# Patient Record
Sex: Male | Born: 1935 | Race: Black or African American | Hispanic: No | Marital: Married | Smoking: Never smoker
Health system: Southern US, Community
[De-identification: ages and names within clinical notes are randomized; demographics above are authoritative.]

## PROBLEM LIST (undated history)

## (undated) DIAGNOSIS — J449 Chronic obstructive pulmonary disease, unspecified: Secondary | ICD-10-CM

## (undated) DIAGNOSIS — E119 Type 2 diabetes mellitus without complications: Secondary | ICD-10-CM

## (undated) DIAGNOSIS — A419 Sepsis, unspecified organism: Secondary | ICD-10-CM

## (undated) DIAGNOSIS — I10 Essential (primary) hypertension: Secondary | ICD-10-CM

## (undated) DIAGNOSIS — J9621 Acute and chronic respiratory failure with hypoxia: Secondary | ICD-10-CM

## (undated) DIAGNOSIS — U071 COVID-19: Secondary | ICD-10-CM

---

## 2004-12-27 ENCOUNTER — Emergency Department: Payer: Self-pay | Admitting: Internal Medicine

## 2009-12-11 ENCOUNTER — Ambulatory Visit: Payer: Self-pay | Admitting: Internal Medicine

## 2009-12-25 ENCOUNTER — Ambulatory Visit: Payer: Self-pay | Admitting: Internal Medicine

## 2012-10-18 ENCOUNTER — Ambulatory Visit: Payer: Self-pay | Admitting: Physician Assistant

## 2015-02-07 ENCOUNTER — Other Ambulatory Visit: Payer: Self-pay | Admitting: Internal Medicine

## 2015-02-07 DIAGNOSIS — M5010 Cervical disc disorder with radiculopathy, unspecified cervical region: Secondary | ICD-10-CM

## 2015-02-13 ENCOUNTER — Ambulatory Visit
Admission: RE | Admit: 2015-02-13 | Discharge: 2015-02-13 | Disposition: A | Discharge status: Home / Self Care | Payer: Medicare Other | Source: Ambulatory Visit | Attending: Internal Medicine | Admitting: Internal Medicine

## 2015-02-13 DIAGNOSIS — M47892 Other spondylosis, cervical region: Secondary | ICD-10-CM | POA: Insufficient documentation

## 2015-02-13 DIAGNOSIS — M5031 Other cervical disc degeneration,  high cervical region: Secondary | ICD-10-CM | POA: Insufficient documentation

## 2015-02-13 DIAGNOSIS — M5412 Radiculopathy, cervical region: Secondary | ICD-10-CM | POA: Diagnosis present

## 2015-02-13 DIAGNOSIS — M4802 Spinal stenosis, cervical region: Secondary | ICD-10-CM | POA: Insufficient documentation

## 2015-02-13 DIAGNOSIS — M5022 Other cervical disc displacement, mid-cervical region: Secondary | ICD-10-CM | POA: Diagnosis not present

## 2015-02-13 DIAGNOSIS — M542 Cervicalgia: Secondary | ICD-10-CM | POA: Diagnosis present

## 2015-02-13 DIAGNOSIS — M5010 Cervical disc disorder with radiculopathy, unspecified cervical region: Secondary | ICD-10-CM

## 2015-02-13 MED ORDER — GADOBENATE DIMEGLUMINE 529 MG/ML IV SOLN
20.0000 mL | Freq: Once | INTRAVENOUS | Status: AC | PRN
  Administered 2015-02-13: 17 mL via INTRAVENOUS

## 2015-10-04 ENCOUNTER — Other Ambulatory Visit: Payer: Self-pay | Admitting: Internal Medicine

## 2015-10-04 ENCOUNTER — Ambulatory Visit
Admission: RE | Admit: 2015-10-04 | Discharge: 2015-10-04 | Disposition: A | Discharge status: Home / Self Care | Payer: Medicare Other | Source: Ambulatory Visit | Attending: Internal Medicine | Admitting: Internal Medicine

## 2015-10-04 DIAGNOSIS — I824Z2 Acute embolism and thrombosis of unspecified deep veins of left distal lower extremity: Secondary | ICD-10-CM | POA: Insufficient documentation

## 2015-10-04 DIAGNOSIS — I8002 Phlebitis and thrombophlebitis of superficial vessels of left lower extremity: Secondary | ICD-10-CM | POA: Insufficient documentation

## 2019-09-18 ENCOUNTER — Ambulatory Visit
Admission: RE | Admit: 2019-09-18 | Discharge: 2019-09-18 | Disposition: A | Discharge status: Home / Self Care | Payer: Medicare Other | Source: Ambulatory Visit | Attending: Internal Medicine | Admitting: Internal Medicine

## 2019-09-18 ENCOUNTER — Other Ambulatory Visit: Payer: Self-pay

## 2019-09-18 ENCOUNTER — Other Ambulatory Visit: Payer: Self-pay | Admitting: Internal Medicine

## 2019-09-18 DIAGNOSIS — L03116 Cellulitis of left lower limb: Secondary | ICD-10-CM | POA: Insufficient documentation

## 2020-04-03 ENCOUNTER — Emergency Department: Payer: Medicare Other

## 2020-04-03 ENCOUNTER — Other Ambulatory Visit: Payer: Self-pay

## 2020-04-03 ENCOUNTER — Inpatient Hospital Stay
Admission: EM | Admit: 2020-04-03 | Discharge: 2020-04-24 | DRG: 871 | Disposition: A | Discharge status: Other Acute Inpatient Hospital | Payer: Medicare Other | Source: Ambulatory Visit | Attending: Family Medicine | Admitting: Family Medicine

## 2020-04-03 DIAGNOSIS — U071 COVID-19: Secondary | ICD-10-CM | POA: Diagnosis present

## 2020-04-03 DIAGNOSIS — Z7984 Long term (current) use of oral hypoglycemic drugs: Secondary | ICD-10-CM

## 2020-04-03 DIAGNOSIS — D509 Iron deficiency anemia, unspecified: Secondary | ICD-10-CM | POA: Diagnosis present

## 2020-04-03 DIAGNOSIS — R197 Diarrhea, unspecified: Secondary | ICD-10-CM | POA: Diagnosis present

## 2020-04-03 DIAGNOSIS — E872 Acidosis, unspecified: Secondary | ICD-10-CM | POA: Diagnosis present

## 2020-04-03 DIAGNOSIS — E119 Type 2 diabetes mellitus without complications: Secondary | ICD-10-CM

## 2020-04-03 DIAGNOSIS — R778 Other specified abnormalities of plasma proteins: Secondary | ICD-10-CM | POA: Diagnosis present

## 2020-04-03 DIAGNOSIS — A419 Sepsis, unspecified organism: Secondary | ICD-10-CM | POA: Diagnosis not present

## 2020-04-03 DIAGNOSIS — J439 Emphysema, unspecified: Secondary | ICD-10-CM | POA: Diagnosis present

## 2020-04-03 DIAGNOSIS — Z86718 Personal history of other venous thrombosis and embolism: Secondary | ICD-10-CM | POA: Diagnosis not present

## 2020-04-03 DIAGNOSIS — Z66 Do not resuscitate: Secondary | ICD-10-CM | POA: Diagnosis not present

## 2020-04-03 DIAGNOSIS — R0902 Hypoxemia: Secondary | ICD-10-CM

## 2020-04-03 DIAGNOSIS — Z7901 Long term (current) use of anticoagulants: Secondary | ICD-10-CM

## 2020-04-03 DIAGNOSIS — E0800 Diabetes mellitus due to underlying condition with hyperosmolarity without nonketotic hyperglycemic-hyperosmolar coma (NKHHC): Secondary | ICD-10-CM | POA: Diagnosis not present

## 2020-04-03 DIAGNOSIS — R6520 Severe sepsis without septic shock: Secondary | ICD-10-CM | POA: Diagnosis present

## 2020-04-03 DIAGNOSIS — Z7189 Other specified counseling: Secondary | ICD-10-CM | POA: Diagnosis not present

## 2020-04-03 DIAGNOSIS — I10 Essential (primary) hypertension: Secondary | ICD-10-CM | POA: Diagnosis present

## 2020-04-03 DIAGNOSIS — J80 Acute respiratory distress syndrome: Secondary | ICD-10-CM | POA: Diagnosis present

## 2020-04-03 DIAGNOSIS — Z79899 Other long term (current) drug therapy: Secondary | ICD-10-CM | POA: Diagnosis not present

## 2020-04-03 DIAGNOSIS — J1282 Pneumonia due to coronavirus disease 2019: Secondary | ICD-10-CM | POA: Diagnosis present

## 2020-04-03 DIAGNOSIS — E11649 Type 2 diabetes mellitus with hypoglycemia without coma: Secondary | ICD-10-CM | POA: Diagnosis not present

## 2020-04-03 DIAGNOSIS — E860 Dehydration: Secondary | ICD-10-CM | POA: Diagnosis present

## 2020-04-03 DIAGNOSIS — N179 Acute kidney failure, unspecified: Secondary | ICD-10-CM | POA: Diagnosis present

## 2020-04-03 DIAGNOSIS — D638 Anemia in other chronic diseases classified elsewhere: Secondary | ICD-10-CM | POA: Diagnosis present

## 2020-04-03 DIAGNOSIS — H9190 Unspecified hearing loss, unspecified ear: Secondary | ICD-10-CM | POA: Diagnosis present

## 2020-04-03 DIAGNOSIS — A4189 Other specified sepsis: Principal | ICD-10-CM | POA: Diagnosis present

## 2020-04-03 DIAGNOSIS — J9621 Acute and chronic respiratory failure with hypoxia: Secondary | ICD-10-CM | POA: Diagnosis not present

## 2020-04-03 DIAGNOSIS — E875 Hyperkalemia: Secondary | ICD-10-CM | POA: Diagnosis present

## 2020-04-03 DIAGNOSIS — R110 Nausea: Secondary | ICD-10-CM | POA: Diagnosis present

## 2020-04-03 DIAGNOSIS — J449 Chronic obstructive pulmonary disease, unspecified: Secondary | ICD-10-CM | POA: Diagnosis not present

## 2020-04-03 DIAGNOSIS — Z283 Underimmunization status: Secondary | ICD-10-CM

## 2020-04-03 DIAGNOSIS — Z515 Encounter for palliative care: Secondary | ICD-10-CM | POA: Diagnosis not present

## 2020-04-03 DIAGNOSIS — J9600 Acute respiratory failure, unspecified whether with hypoxia or hypercapnia: Secondary | ICD-10-CM | POA: Diagnosis not present

## 2020-04-03 DIAGNOSIS — Z888 Allergy status to other drugs, medicaments and biological substances status: Secondary | ICD-10-CM

## 2020-04-03 DIAGNOSIS — J989 Respiratory disorder, unspecified: Secondary | ICD-10-CM | POA: Diagnosis present

## 2020-04-03 DIAGNOSIS — R0602 Shortness of breath: Secondary | ICD-10-CM

## 2020-04-03 DIAGNOSIS — R1084 Generalized abdominal pain: Secondary | ICD-10-CM

## 2020-04-03 HISTORY — DX: Essential (primary) hypertension: I10

## 2020-04-03 HISTORY — DX: Type 2 diabetes mellitus without complications: E11.9

## 2020-04-03 LAB — CBC WITH DIFFERENTIAL/PLATELET
Abs Immature Granulocytes: 0.03 10*3/uL (ref 0.00–0.07)
Basophils Absolute: 0 10*3/uL (ref 0.0–0.1)
Basophils Relative: 0 %
Eosinophils Absolute: 0 10*3/uL (ref 0.0–0.5)
Eosinophils Relative: 0 %
HCT: 38.6 % — ABNORMAL LOW (ref 39.0–52.0)
Hemoglobin: 12.7 g/dL — ABNORMAL LOW (ref 13.0–17.0)
Immature Granulocytes: 1 %
Lymphocytes Relative: 11 %
Lymphs Abs: 0.6 10*3/uL — ABNORMAL LOW (ref 0.7–4.0)
MCH: 21.2 pg — ABNORMAL LOW (ref 26.0–34.0)
MCHC: 32.9 g/dL (ref 30.0–36.0)
MCV: 64.5 fL — ABNORMAL LOW (ref 80.0–100.0)
Monocytes Absolute: 0.6 10*3/uL (ref 0.1–1.0)
Monocytes Relative: 11 %
Neutro Abs: 3.9 10*3/uL (ref 1.7–7.7)
Neutrophils Relative %: 77 %
Platelets: 305 10*3/uL (ref 150–400)
RBC: 5.98 MIL/uL — ABNORMAL HIGH (ref 4.22–5.81)
RDW: 22.6 % — ABNORMAL HIGH (ref 11.5–15.5)
Smear Review: NORMAL
WBC: 5.1 10*3/uL (ref 4.0–10.5)
nRBC: 0 % (ref 0.0–0.2)

## 2020-04-03 LAB — LACTIC ACID, PLASMA
Lactic Acid, Venous: 4.4 mmol/L (ref 0.5–1.9)
Lactic Acid, Venous: 3.3 mmol/L (ref 0.5–1.9)

## 2020-04-03 LAB — COMPREHENSIVE METABOLIC PANEL
ALT: 84 U/L — ABNORMAL HIGH (ref 0–44)
AST: 102 U/L — ABNORMAL HIGH (ref 15–41)
Albumin: 2.8 g/dL — ABNORMAL LOW (ref 3.5–5.0)
Alkaline Phosphatase: 67 U/L (ref 38–126)
Anion gap: 15 (ref 5–15)
BUN: 15 mg/dL (ref 8–23)
CO2: 15 mmol/L — ABNORMAL LOW (ref 22–32)
Calcium: 8.2 mg/dL — ABNORMAL LOW (ref 8.9–10.3)
Chloride: 109 mmol/L (ref 98–111)
Creatinine, Ser: 1.37 mg/dL — ABNORMAL HIGH (ref 0.61–1.24)
GFR calc Af Amer: 54 mL/min — ABNORMAL LOW (ref >60)
GFR calc non Af Amer: 46 mL/min — ABNORMAL LOW (ref >60)
Glucose, Bld: 187 mg/dL — ABNORMAL HIGH (ref 70–99)
Potassium: 4.4 mmol/L (ref 3.5–5.1)
Sodium: 139 mmol/L (ref 135–145)
Total Bilirubin: 1 mg/dL (ref 0.3–1.2)
Total Protein: 7.8 g/dL (ref 6.5–8.1)

## 2020-04-03 LAB — BLOOD GAS, VENOUS
Acid-base deficit: 9.7 mmol/L — ABNORMAL HIGH (ref 0.0–2.0)
Bicarbonate: 14.8 mmol/L — ABNORMAL LOW (ref 20.0–28.0)
FIO2: 0.5
O2 Saturation: 44.7 %
Patient temperature: 37
pCO2, Ven: 28 mmHg — ABNORMAL LOW (ref 44.0–60.0)
pH, Ven: 7.33 (ref 7.250–7.430)
pO2, Ven: <31 mmHg — CL (ref 32.0–45.0)

## 2020-04-03 LAB — TROPONIN I (HIGH SENSITIVITY)
Troponin I (High Sensitivity): 19 ng/L — ABNORMAL HIGH (ref <18)
Troponin I (High Sensitivity): 19 ng/L — ABNORMAL HIGH (ref <18)
Troponin I (High Sensitivity): 19 ng/L — ABNORMAL HIGH (ref <18)

## 2020-04-03 LAB — FIBRINOGEN: Fibrinogen: 599 mg/dL — ABNORMAL HIGH (ref 210–475)

## 2020-04-03 LAB — GLUCOSE, CAPILLARY: Glucose-Capillary: 196 mg/dL — ABNORMAL HIGH (ref 70–99)

## 2020-04-03 LAB — BRAIN NATRIURETIC PEPTIDE: B Natriuretic Peptide: 193.5 pg/mL — ABNORMAL HIGH (ref 0.0–100.0)

## 2020-04-03 LAB — FERRITIN: Ferritin: 224 ng/mL (ref 24–336)

## 2020-04-03 LAB — SARS CORONAVIRUS 2 BY RT PCR (HOSPITAL ORDER, PERFORMED IN ~~LOC~~ HOSPITAL LAB): SARS Coronavirus 2: POSITIVE — AB

## 2020-04-03 LAB — FIBRIN DERIVATIVES D-DIMER (ARMC ONLY): Fibrin derivatives D-dimer (ARMC): 2643.96 ng/mL (FEU) — ABNORMAL HIGH (ref 0.00–499.00)

## 2020-04-03 LAB — LACTATE DEHYDROGENASE: LDH: 402 U/L — ABNORMAL HIGH (ref 98–192)

## 2020-04-03 MED ORDER — SODIUM CHLORIDE 0.9 % IV SOLN
100.0000 mg | Freq: Every day | INTRAVENOUS | Status: AC
  Administered 2020-04-04 – 2020-04-07 (×4): 100 mg via INTRAVENOUS
  Filled 2020-04-03: qty 100
  Filled 2020-04-03: qty 20
  Filled 2020-04-03 (×2): qty 100

## 2020-04-03 MED ORDER — IPRATROPIUM-ALBUTEROL 20-100 MCG/ACT IN AERS
1.0000 | INHALATION_SPRAY | Freq: Four times a day (QID) | RESPIRATORY_TRACT | Status: DC
  Administered 2020-04-04 – 2020-04-14 (×39): 1 via RESPIRATORY_TRACT
  Filled 2020-04-03 (×3): qty 4

## 2020-04-03 MED ORDER — SODIUM CHLORIDE 0.9 % IV SOLN
500.0000 mg | INTRAVENOUS | Status: DC
  Administered 2020-04-03 – 2020-04-06 (×4): 500 mg via INTRAVENOUS
  Filled 2020-04-03 (×5): qty 500

## 2020-04-03 MED ORDER — HYDROCOD POLST-CPM POLST ER 10-8 MG/5ML PO SUER
5.0000 mL | Freq: Two times a day (BID) | ORAL | Status: DC | PRN
  Administered 2020-04-10 – 2020-04-17 (×3): 5 mL via ORAL
  Filled 2020-04-03 (×3): qty 5

## 2020-04-03 MED ORDER — ONDANSETRON HCL 4 MG/2ML IJ SOLN: 4.0000 mg | Freq: Four times a day (QID) | INTRAMUSCULAR | Status: DC | PRN

## 2020-04-03 MED ORDER — INSULIN ASPART 100 UNIT/ML ~~LOC~~ SOLN
0.0000 [IU] | Freq: Every day | SUBCUTANEOUS | Status: DC
  Administered 2020-04-04: 2 [IU] via SUBCUTANEOUS
  Administered 2020-04-10: 4 [IU] via SUBCUTANEOUS
  Administered 2020-04-11: 3 [IU] via SUBCUTANEOUS
  Administered 2020-04-13: 4 [IU] via SUBCUTANEOUS
  Administered 2020-04-14: 2 [IU] via SUBCUTANEOUS
  Filled 2020-04-03 (×6): qty 1

## 2020-04-03 MED ORDER — INSULIN ASPART 100 UNIT/ML ~~LOC~~ SOLN
0.0000 [IU] | Freq: Three times a day (TID) | SUBCUTANEOUS | Status: DC
  Administered 2020-04-04 (×2): 3 [IU] via SUBCUTANEOUS
  Filled 2020-04-03 (×2): qty 1

## 2020-04-03 MED ORDER — ADULT MULTIVITAMIN W/MINERALS CH
1.0000 | ORAL_TABLET | Freq: Every day | ORAL | Status: DC
  Administered 2020-04-03 – 2020-04-24 (×21): 1 via ORAL
  Filled 2020-04-03 (×22): qty 1

## 2020-04-03 MED ORDER — IOHEXOL 350 MG/ML SOLN
75.0000 mL | Freq: Once | INTRAVENOUS | Status: AC | PRN
  Administered 2020-04-03: 75 mL via INTRAVENOUS

## 2020-04-03 MED ORDER — ACETAMINOPHEN 325 MG PO TABS
650.0000 mg | ORAL_TABLET | Freq: Four times a day (QID) | ORAL | Status: DC | PRN
  Administered 2020-04-09 – 2020-04-14 (×2): 650 mg via ORAL
  Filled 2020-04-03 (×3): qty 2

## 2020-04-03 MED ORDER — THIAMINE HCL 100 MG PO TABS
100.0000 mg | ORAL_TABLET | Freq: Every day | ORAL | Status: DC
  Administered 2020-04-03 – 2020-04-24 (×21): 100 mg via ORAL
  Filled 2020-04-03 (×23): qty 1

## 2020-04-03 MED ORDER — GUAIFENESIN-DM 100-10 MG/5ML PO SYRP
10.0000 mL | ORAL_SOLUTION | ORAL | Status: DC | PRN
  Filled 2020-04-03 (×2): qty 10

## 2020-04-03 MED ORDER — ENOXAPARIN SODIUM 40 MG/0.4ML ~~LOC~~ SOLN
40.0000 mg | SUBCUTANEOUS | Status: DC
  Administered 2020-04-03: 40 mg via SUBCUTANEOUS

## 2020-04-03 MED ORDER — SODIUM CHLORIDE 0.9 % IV SOLN
200.0000 mg | Freq: Once | INTRAVENOUS | Status: AC
  Administered 2020-04-03: 200 mg via INTRAVENOUS
  Filled 2020-04-03: qty 200

## 2020-04-03 MED ORDER — FOLIC ACID 1 MG PO TABS
1.0000 mg | ORAL_TABLET | Freq: Every day | ORAL | Status: DC
  Administered 2020-04-03 – 2020-04-07 (×5): 1 mg via ORAL
  Filled 2020-04-03 (×5): qty 1

## 2020-04-03 MED ORDER — DEXAMETHASONE SODIUM PHOSPHATE 10 MG/ML IJ SOLN
6.0000 mg | INTRAMUSCULAR | Status: DC
  Administered 2020-04-03: 6 mg via INTRAVENOUS
  Filled 2020-04-03: qty 1

## 2020-04-03 MED ORDER — SODIUM CHLORIDE 0.9 % IV SOLN
100.0000 mg | Freq: Every day | INTRAVENOUS | Status: DC
  Filled 2020-04-03: qty 20

## 2020-04-03 MED ORDER — ONDANSETRON HCL 4 MG PO TABS: 4.0000 mg | ORAL_TABLET | Freq: Four times a day (QID) | ORAL | Status: DC | PRN

## 2020-04-03 MED ORDER — BARICITINIB 2 MG PO TABS
2.0000 mg | ORAL_TABLET | Freq: Every day | ORAL | Status: DC
  Administered 2020-04-03 – 2020-04-06 (×3): 2 mg via ORAL
  Filled 2020-04-03 (×6): qty 1

## 2020-04-03 NOTE — ED Notes (Signed)
Pt going to CT

## 2020-04-03 NOTE — ED Provider Notes (Signed)
[facility] Emergency Department Provider Note   ____________________________________________   First MD Initiated Contact with Patient 04/03/20 1528     (approximate)  I have reviewed the triage vital signs and the nursing notes.   HISTORY  Chief Complaint Respiratory Distress    HPI ELSTON ALDAPE is a 86 y.o. male who has been short of breath for several days has been getting worse.  He also has nausea diarrhea.  He is having some belly pain now.  He thinks may be it is from coughing.  He is had a fever.  He is coughing up some phlegm but cannot tell me what color it is.  O2 sats were in the 80s on room air 90 to 100%         Past Medical History:  Diagnosis Date  . Diabetes mellitus without complication (HCC)   . Hypertension     There are no problems to display for this patient.   History reviewed. No pertinent surgical history.  Prior to Admission medications   Not on File    Allergies Patient has no allergy information on record.  No family history on file.  Social History Social History   Tobacco Use  . Smoking status: Never Smoker  . Smokeless tobacco: Never Used  Substance Use Topics  . Alcohol use: Never  . Drug use: Never    Review of Systems  Constitutional: fever/chills Eyes: No visual changes. ENT: No sore throat. Cardiovascular: Denies chest pain. Respiratory:  shortness of breath. Gastrointestinal:  abdominal pain.   nausea, no vomiting.  diarrhea.  No constipation. Genitourinary: Negative for dysuria. Musculoskeletal: Negative for back pain. Skin: Negative for rash. Neurological: Negative for headaches, focal weakness   ____________________________________________   PHYSICAL EXAM:  VITAL SIGNS: ED Triage Vitals  Enc Vitals Group     BP --      Pulse --      Resp --      Temp --      Temp src --      SpO2 04/03/20 1525 92 %     Weight 04/03/20 1526 150 lb (68 kg)     Height 04/03/20 1526  5\' 6"  (1.676 m)     Head Circumference --      Peak Flow --      Pain Score 04/03/20 1526 0     Pain Loc --      Pain Edu? --      Excl. in GC? --     Constitutional: Alert and oriented. Well appearing and in no acute distress. Eyes: Conjunctivae are normal. PER Head: Atraumatic. Nose: No congestion/rhinnorhea. Mouth/Throat: Mucous membranes are moist.  Oropharynx non-erythematous. Neck: No stridor.   Cardiovascular: Rapid rate, regular rhythm. Grossly normal heart sounds.  Good peripheral circulation. Respiratory: Increased respiratory effort.  No retractions. Lungs crackles in bases Gastrointestinal: Soft diffusely tender no distention. No abdominal bruits. N Musculoskeletal: No lower extremity tenderness nor edema.  . Neurologic:  Normal speech and language. No gross focal neurologic deficits are appreciated.  Skin:  Skin is warm, dry and intact. No rash noted. .  ____________________________________________   LABS (all labs ordered are listed, but only abnormal results are displayed)  Labs Reviewed  SARS CORONAVIRUS 2 BY RT PCR (HOSPITAL ORDER, PERFORMED IN [facility]) - Abnormal; Notable for the following components:      Result Value   SARS Coronavirus 2 POSITIVE (*)    All other components within normal  limits  COMPREHENSIVE METABOLIC PANEL - Abnormal; Notable for the following components:   CO2 15 (*)    Glucose, Bld 187 (*)    Creatinine, Ser 1.37 (*)    Calcium 8.2 (*)    Albumin 2.8 (*)    AST 102 (*)    ALT 84 (*)    GFR calc non Af Amer 46 (*)    GFR calc Af Amer 54 (*)    All other components within normal limits  BRAIN NATRIURETIC PEPTIDE - Abnormal; Notable for the following components:   B Natriuretic Peptide 193.5 (*)    All other components within normal limits  LACTIC ACID, PLASMA - Abnormal; Notable for the following components:   Lactic Acid, Venous 4.4 (*)    All other components within normal limits  CBC WITH  DIFFERENTIAL/PLATELET - Abnormal; Notable for the following components:   RBC 5.98 (*)    Hemoglobin 12.7 (*)    HCT 38.6 (*)    MCV 64.5 (*)    MCH 21.2 (*)    RDW 22.6 (*)    Lymphs Abs 0.6 (*)    All other components within normal limits  BLOOD GAS, VENOUS - Abnormal; Notable for the following components:   pCO2, Ven 28 (*)    pO2, Ven <31.0 (*)    Bicarbonate 14.8 (*)    Acid-base deficit 9.7 (*)    All other components within normal limits  FIBRIN DERIVATIVES D-DIMER ([facility] ONLY) - Abnormal; Notable for the following components:   Fibrin derivatives D-dimer [facility]) [phone] (*)    All other components within normal limits  TROPONIN I (HIGH SENSITIVITY) - Abnormal; Notable for the following components:   Troponin I (High Sensitivity) 19 (*)    All other components within normal limits  TROPONIN I (HIGH SENSITIVITY) - Abnormal; Notable for the following components:   Troponin I (High Sensitivity) 19 (*)    All other components within normal limits  LACTIC ACID, PLASMA  URINALYSIS, COMPLETE (UACMP) WITH MICROSCOPIC   ____________________________________________  EKG  EKG read interpreted by me shows sinus tach rate of 103 multiple PVCs slight left axis no acute ST-T wave changes ____________________________________________  RADIOLOGY  ED MD interpretation: CT of the chest is read by radiology as no PE no acute airspace disease but emphysema.  Patient has Covid.  Patient to me appears to have Covid pneumonia.  He does also have emphysema. Official radiology report(s): CT Angio Chest PE W and/or Wo Contrast  Result Date: 04/03/2020 CLINICAL DATA:  COVID-19 positive, shortness of breath, weakness, hypoxia, abdominal pain EXAM: CT ANGIOGRAPHY CHEST CT ABDOMEN AND PELVIS WITH CONTRAST TECHNIQUE: Multidetector CT imaging of the chest was performed using the standard protocol during bolus administration of intravenous contrast. Multiplanar CT image reconstructions and MIPs were  obtained to evaluate the vascular anatomy. Multidetector CT imaging of the abdomen and pelvis was performed using the standard protocol during bolus administration of intravenous contrast. CONTRAST:  70mL OMNIPAQUE IOHEXOL 350 MG/ML SOLN COMPARISON:  04/03/2020 FINDINGS: CTA CHEST FINDINGS Cardiovascular: This is a technically adequate evaluation of the pulmonary vasculature. No filling defects or pulmonary emboli. The heart is unremarkable without pericardial effusion. Normal caliber of the thoracic aorta. Mediastinum/Nodes: Mildly enlarged subcarinal lymph node measures 13 mm in short axis. Trachea, esophagus, and thyroid are unremarkable. Lungs/Pleura: There is severe upper lobe predominant emphysema. No acute airspace disease, effusion, or pneumothorax. Bibasilar areas of scarring are noted, right greater than left. The central airways are patent. Musculoskeletal: No acute  or destructive bony lesions. Reconstructed images demonstrate no additional findings. Review of the MIP images confirms the above findings. CT ABDOMEN and PELVIS FINDINGS Hepatobiliary: 4.2 cm cyst within the caudate lobe of the liver. Otherwise liver is unremarkable. No biliary dilation. The gallbladder is normal. Pancreas: Unremarkable. No pancreatic ductal dilatation or surrounding inflammatory changes. Spleen: Normal in size without focal abnormality. Adrenals/Urinary Tract: Adrenal glands are unremarkable. Kidneys are normal, without renal calculi, focal lesion, or hydronephrosis. Bladder is unremarkable. Stomach/Bowel: No bowel obstruction or ileus. Normal appendix right lower quadrant. There is diffuse diverticulosis of the descending and sigmoid colon without diverticulitis. Vascular/Lymphatic: Aortic atherosclerosis. No enlarged abdominal or pelvic lymph nodes. Reproductive: Prostate is unremarkable. Other: No free fluid or free gas.  No abdominal wall hernia. Musculoskeletal: No acute or destructive bony lesions. Reconstructed images  demonstrate no additional findings. Review of the MIP images confirms the above findings. IMPRESSION: 1. No evidence of pulmonary embolus. 2. Borderline enlarged subcarinal lymph node, likely reactive. 3. Diverticulosis without diverticulitis. 4. Aortic Atherosclerosis (ICD10-I70.0) and Emphysema (ICD10-J43.9). Electronically Signed   By: Sharlet SalinaMichael  Brown M.D.   On: 04/03/2020 19:14   CT ABDOMEN PELVIS W CONTRAST  Result Date: 04/03/2020 CLINICAL DATA:  COVID-19 positive, shortness of breath, weakness, hypoxia, abdominal pain EXAM: CT ANGIOGRAPHY CHEST CT ABDOMEN AND PELVIS WITH CONTRAST TECHNIQUE: Multidetector CT imaging of the chest was performed using the standard protocol during bolus administration of intravenous contrast. Multiplanar CT image reconstructions and MIPs were obtained to evaluate the vascular anatomy. Multidetector CT imaging of the abdomen and pelvis was performed using the standard protocol during bolus administration of intravenous contrast. CONTRAST:  75mL OMNIPAQUE IOHEXOL 350 MG/ML SOLN COMPARISON:  04/03/2020 FINDINGS: CTA CHEST FINDINGS Cardiovascular: This is a technically adequate evaluation of the pulmonary vasculature. No filling defects or pulmonary emboli. The heart is unremarkable without pericardial effusion. Normal caliber of the thoracic aorta. Mediastinum/Nodes: Mildly enlarged subcarinal lymph node measures 13 mm in short axis. Trachea, esophagus, and thyroid are unremarkable. Lungs/Pleura: There is severe upper lobe predominant emphysema. No acute airspace disease, effusion, or pneumothorax. Bibasilar areas of scarring are noted, right greater than left. The central airways are patent. Musculoskeletal: No acute or destructive bony lesions. Reconstructed images demonstrate no additional findings. Review of the MIP images confirms the above findings. CT ABDOMEN and PELVIS FINDINGS Hepatobiliary: 4.2 cm cyst within the caudate lobe of the liver. Otherwise liver is unremarkable.  No biliary dilation. The gallbladder is normal. Pancreas: Unremarkable. No pancreatic ductal dilatation or surrounding inflammatory changes. Spleen: Normal in size without focal abnormality. Adrenals/Urinary Tract: Adrenal glands are unremarkable. Kidneys are normal, without renal calculi, focal lesion, or hydronephrosis. Bladder is unremarkable. Stomach/Bowel: No bowel obstruction or ileus. Normal appendix right lower quadrant. There is diffuse diverticulosis of the descending and sigmoid colon without diverticulitis. Vascular/Lymphatic: Aortic atherosclerosis. No enlarged abdominal or pelvic lymph nodes. Reproductive: Prostate is unremarkable. Other: No free fluid or free gas.  No abdominal wall hernia. Musculoskeletal: No acute or destructive bony lesions. Reconstructed images demonstrate no additional findings. Review of the MIP images confirms the above findings. IMPRESSION: 1. No evidence of pulmonary embolus. 2. Borderline enlarged subcarinal lymph node, likely reactive. 3. Diverticulosis without diverticulitis. 4. Aortic Atherosclerosis (ICD10-I70.0) and Emphysema (ICD10-J43.9). Electronically Signed   By: Sharlet SalinaMichael  Brown M.D.   On: 04/03/2020 19:14   DG Chest Portable 1 View  Result Date: 04/03/2020 CLINICAL DATA:  Respiratory distress, weakness, short of breath EXAM: PORTABLE CHEST 1 VIEW COMPARISON:  None. FINDINGS: Single frontal  view of the chest demonstrates an unremarkable cardiac silhouette. There is diffuse increased interstitial prominence throughout the lungs, without focal consolidation, effusion, or pneumothorax. There are no acute bony abnormalities. IMPRESSION: 1. Diffuse increased interstitial prominence, without focal lung consolidation. Findings are of uncertain acuity, but could reflect interstitial edema, atypical infection, or scarring. Electronically Signed   By: Sharlet Salina M.D.   On: 04/03/2020 15:51     ____________________________________________   PROCEDURES  Procedure(s) performed (including Critical Care):  Procedures   ____________________________________________   INITIAL IMPRESSION / ASSESSMENT AND PLAN / ED COURSE               ____________________________________________   FINAL CLINICAL IMPRESSION(S) / ED DIAGNOSES  Final diagnoses:  COVID-19  Hypoxia  Generalized abdominal pain     ED Discharge Orders    None       Note:  This document was prepared using Dragon voice recognition software and may include unintentional dictation errors.    Arnaldo Natal, MD 04/03/20 [phone]

## 2020-04-03 NOTE — ED Notes (Signed)
RT at bedside placing pt on BIPAP

## 2020-04-03 NOTE — ED Notes (Signed)
Lab called for blood draw at thsit ime

## 2020-04-03 NOTE — ED Triage Notes (Addendum)
Pt arrives via EMS from home PCP with c/o respiratory distress. Pt reports weakness and SOB. Pt went to PCP for evaluation. Pt unable to get out of car and O2 in 70% RA.  EMS placed pt on NRB and [place] at 18L total with O2 at 93%. Pt states some diarrhea for couples of days. Family reports possible COVID.

## 2020-04-03 NOTE — ED Notes (Signed)
Pt placed on NRB at this time. Sats 98%

## 2020-04-03 NOTE — Consult Note (Signed)
Remdesivir - Pharmacy Brief Note   O:  ALT: 84 CXR: Diffuse increased interstitial prominence, without focal lung consolidation. Findings are of uncertain acuity, but could reflect interstitial edema, atypical infection, or scarring. SpO2: 97% on Bi-PAP   A/P:  Remdesivir 200 mg IVPB once followed by 100 mg IVPB daily x 4 days.   Reatha Armour, PharmD Pharmacy Resident  04/03/2020 8:26 PM

## 2020-04-03 NOTE — ED Notes (Signed)
Date and time results received: 04/03/20 1529 (use smartphrase ".now" to insert current time)  Test: lactic Critical Value: 4.4  Name of Provider Notified: Darnelle Catalan MD  Orders Received? Or Actions Taken?: Orders Received - See Orders for details

## 2020-04-03 NOTE — H&P (Signed)
History and Physical   Stephen Siturnest H Stough EXB:[id number] DOB: 01/08/1932 DOA: 04/03/2020  Referring MD/NP/PA: Dr. Darnelle CatalanMalinda  PCP: Barbette ReichmannHande, Vishwanath, MD   Outpatient Specialists: None  Patient coming from: Home  Chief Complaint: Shortness of breath  HPI: Stephen Paul is a 86 y.o. male with medical history significant of diabetes and hypertension who apparently was told about a week ago that he was positive for COVID-19.  Never had any symptoms did not feel any worse until now.  His shortness of breath was progressive and got worse.  On arrival in the ER his oxygen sat was in the 70s.  He is having some cough.  He also reported subjective fever at home.  He is having abdominal pain as well.  Patient was evaluated again and found to be COVID-19 positive in the ER.  Patient also has had some loss of appetite.  Currently on BiPAP in the ER with oxygen sat at 100%.  He is currently being admitted to the hospital with acute respiratory failure secondary to COVID-19 infection.  Appears to have COPD on CT.  ED Course: Temperature 98 blood pressure 173/90 pulse 90 respirate 29 oxygen sat 90% on room air.  White count 5.1 his hemoglobin 12.7 and platelets of 305.  Chemistry showed CO2 15 creatinine 1.37 calcium 8.2.  COVID-19 screen is positive.  Fibrin diabetes 2643.  CT abdomen and pelvis as well as CT angiogram of the chest not significant changes.  Lactic acid is 4.4 troponin of 19.  BNP 193.  Chest x-ray showed bilateral infiltrates.  Patient being admitted to the hospital with acute respiratory failure secondary to COVID-19 infection.  Review of Systems: As per HPI otherwise 10 point review of systems negative.    Past Medical History:  Diagnosis Date  . Diabetes mellitus without complication (HCC)   . Hypertension     History reviewed. No pertinent surgical history.   reports that he has never smoked. He has never used smokeless tobacco. He reports that he does not drink alcohol and does not  use drugs.  Not on File  No family history on file.   Prior to Admission medications   Not on File    Physical Exam: Vitals:   04/03/20 1800 04/03/20 1815 04/03/20 1830 04/03/20 1934  BP: (!) 150/105  136/79 (!) 146/85  Pulse: 87 91 88 90  Resp: 18 (!) 24 (!) 21 (!) 23  Temp:      SpO2: 96% 95% 93% 95%  Weight:      Height:          Constitutional: Acutely ill looking in respiratory distress Vitals:   04/03/20 1800 04/03/20 1815 04/03/20 1830 04/03/20 1934  BP: (!) 150/105  136/79 (!) 146/85  Pulse: 87 91 88 90  Resp: 18 (!) 24 (!) 21 (!) 23  Temp:      SpO2: 96% 95% 93% 95%  Weight:      Height:       Eyes: PERRL, lids and conjunctivae normal ENMT: Mucous membranes are moist. Posterior pharynx clear of any exudate or lesions.Normal dentition.  Neck: normal, supple, no masses, no thyromegaly Respiratory: Decreased air entry bilaterally with expiratory wheezing, rhonchi but no crackles, increased respiratory efforts. No accessory muscle use.  Cardiovascular: Regular rate and rhythm, no murmurs / rubs / gallops. No extremity edema. 2+ pedal pulses. No carotid bruits.  Abdomen: no tenderness, no masses palpated. No hepatosplenomegaly. Bowel sounds positive.  Musculoskeletal: no clubbing / cyanosis. No joint deformity  upper and lower extremities. Good ROM, no contractures. Normal muscle tone.  Skin: no rashes, lesions, ulcers. No induration Neurologic: CN 2-12 grossly intact. Sensation intact, DTR normal. Strength 5/5 in all 4.  Psychiatric: Normal judgment and insight. Alert and oriented x 3. Normal mood.     Labs on Admission: I have personally reviewed following labs and imaging studies  CBC: Recent Labs  Lab 04/03/20 1530  WBC 5.1  NEUTROABS 3.9  HGB 12.7*  HCT 38.6*  MCV 64.5*  PLT 305   Basic Metabolic Panel: Recent Labs  Lab 04/03/20 1530  NA 139  K 4.4  CL 109  CO2 15*  GLUCOSE 187*  BUN 15  CREATININE 1.37*  CALCIUM 8.2*    GFR: Estimated Creatinine Clearance: 34.9 mL/min (A) (by C-G formula based on SCr of 1.37 mg/dL (H)). Liver Function Tests: Recent Labs  Lab 04/03/20 1530  AST 102*  ALT 84*  ALKPHOS 67  BILITOT 1.0  PROT 7.8  ALBUMIN 2.8*   No results for input(s): LIPASE, AMYLASE in the last 168 hours. No results for input(s): AMMONIA in the last 168 hours. Coagulation Profile: No results for input(s): INR, PROTIME in the last 168 hours. Cardiac Enzymes: No results for input(s): CKTOTAL, CKMB, CKMBINDEX, TROPONINI in the last 168 hours. BNP (last 3 results) No results for input(s): PROBNP in the last 8760 hours. HbA1C: No results for input(s): HGBA1C in the last 72 hours. CBG: No results for input(s): GLUCAP in the last 168 hours. Lipid Profile: No results for input(s): CHOL, HDL, LDLCALC, TRIG, CHOLHDL, LDLDIRECT in the last 72 hours. Thyroid Function Tests: No results for input(s): TSH, T4TOTAL, FREET4, T3FREE, THYROIDAB in the last 72 hours. Anemia Panel: No results for input(s): VITAMINB12, FOLATE, FERRITIN, TIBC, IRON, RETICCTPCT in the last 72 hours. Urine analysis: No results found for: COLORURINE, APPEARANCEUR, LABSPEC, PHURINE, GLUCOSEU, HGBUR, BILIRUBINUR, KETONESUR, PROTEINUR, UROBILINOGEN, NITRITE, LEUKOCYTESUR Sepsis Labs: @LABRCNTIP (procalcitonin:4,lacticidven:4) ) Recent Results (from the past 240 hour(s))  SARS Coronavirus 2 by RT PCR (hospital order, performed in [facility] hospital lab) Nasopharyngeal Nasopharyngeal Swab     Status: Abnormal   Collection Time: 04/03/20  3:30 PM   Specimen: Nasopharyngeal Swab  Result Value Ref Range Status   SARS Coronavirus 2 POSITIVE (A) NEGATIVE Final    Comment: RESULT CALLED TO, READ BACK BY AND VERIFIED WITH: SAMANTHA HAMILTON 04/03/20 AT 1748 BY ACR (NOTE) SARS-CoV-2 target nucleic acids are DETECTED  SARS-CoV-2 RNA is generally detectable in upper respiratory specimens  during the acute phase of infection.  Positive  results are indicative  of the presence of the identified virus, but do not rule out bacterial infection or co-infection with other pathogens not detected by the test.  Clinical correlation with patient history and  other diagnostic information is necessary to determine patient infection status.  The expected result is negative.  Fact Sheet for Patients:   06/03/20   Fact Sheet for Healthcare Providers:   BoilerBrush.com.cy    This test is not yet approved or cleared by the https://pope.com/ FDA and  has been authorized for detection and/or diagnosis of SARS-CoV-2 by FDA under an Emergency Use Authorization (EUA).  This EUA will remain in effect (meaning th is test can be used) for the duration of  the COVID-19 declaration under Section 564(b)(1) of the Act, 21 U.S.C. section 360-bbb-3(b)(1), unless the authorization is terminated or revoked sooner.  Performed at [facility], [address], [place], [place]      Radiological  Exams on Admission: CT Angio Chest PE W and/or Wo Contrast  Result Date: 04/03/2020 CLINICAL DATA:  COVID-19 positive, shortness of breath, weakness, hypoxia, abdominal pain EXAM: CT ANGIOGRAPHY CHEST CT ABDOMEN AND PELVIS WITH CONTRAST TECHNIQUE: Multidetector CT imaging of the chest was performed using the standard protocol during bolus administration of intravenous contrast. Multiplanar CT image reconstructions and MIPs were obtained to evaluate the vascular anatomy. Multidetector CT imaging of the abdomen and pelvis was performed using the standard protocol during bolus administration of intravenous contrast. CONTRAST:  22mL OMNIPAQUE IOHEXOL 350 MG/ML SOLN COMPARISON:  04/03/2020 FINDINGS: CTA CHEST FINDINGS Cardiovascular: This is a technically adequate evaluation of the pulmonary vasculature. No filling defects or pulmonary emboli. The heart is unremarkable without pericardial effusion.  Normal caliber of the thoracic aorta. Mediastinum/Nodes: Mildly enlarged subcarinal lymph node measures 13 mm in short axis. Trachea, esophagus, and thyroid are unremarkable. Lungs/Pleura: There is severe upper lobe predominant emphysema. No acute airspace disease, effusion, or pneumothorax. Bibasilar areas of scarring are noted, right greater than left. The central airways are patent. Musculoskeletal: No acute or destructive bony lesions. Reconstructed images demonstrate no additional findings. Review of the MIP images confirms the above findings. CT ABDOMEN and PELVIS FINDINGS Hepatobiliary: 4.2 cm cyst within the caudate lobe of the liver. Otherwise liver is unremarkable. No biliary dilation. The gallbladder is normal. Pancreas: Unremarkable. No pancreatic ductal dilatation or surrounding inflammatory changes. Spleen: Normal in size without focal abnormality. Adrenals/Urinary Tract: Adrenal glands are unremarkable. Kidneys are normal, without renal calculi, focal lesion, or hydronephrosis. Bladder is unremarkable. Stomach/Bowel: No bowel obstruction or ileus. Normal appendix right lower quadrant. There is diffuse diverticulosis of the descending and sigmoid colon without diverticulitis. Vascular/Lymphatic: Aortic atherosclerosis. No enlarged abdominal or pelvic lymph nodes. Reproductive: Prostate is unremarkable. Other: No free fluid or free gas.  No abdominal wall hernia. Musculoskeletal: No acute or destructive bony lesions. Reconstructed images demonstrate no additional findings. Review of the MIP images confirms the above findings. IMPRESSION: 1. No evidence of pulmonary embolus. 2. Borderline enlarged subcarinal lymph node, likely reactive. 3. Diverticulosis without diverticulitis. 4. Aortic Atherosclerosis (ICD10-I70.0) and Emphysema (ICD10-J43.9). Electronically Signed   By: Sharlet Salina M.D.   On: 04/03/2020 19:14   CT ABDOMEN PELVIS W CONTRAST  Result Date: 04/03/2020 CLINICAL DATA:  COVID-19  positive, shortness of breath, weakness, hypoxia, abdominal pain EXAM: CT ANGIOGRAPHY CHEST CT ABDOMEN AND PELVIS WITH CONTRAST TECHNIQUE: Multidetector CT imaging of the chest was performed using the standard protocol during bolus administration of intravenous contrast. Multiplanar CT image reconstructions and MIPs were obtained to evaluate the vascular anatomy. Multidetector CT imaging of the abdomen and pelvis was performed using the standard protocol during bolus administration of intravenous contrast. CONTRAST:  81mL OMNIPAQUE IOHEXOL 350 MG/ML SOLN COMPARISON:  04/03/2020 FINDINGS: CTA CHEST FINDINGS Cardiovascular: This is a technically adequate evaluation of the pulmonary vasculature. No filling defects or pulmonary emboli. The heart is unremarkable without pericardial effusion. Normal caliber of the thoracic aorta. Mediastinum/Nodes: Mildly enlarged subcarinal lymph node measures 13 mm in short axis. Trachea, esophagus, and thyroid are unremarkable. Lungs/Pleura: There is severe upper lobe predominant emphysema. No acute airspace disease, effusion, or pneumothorax. Bibasilar areas of scarring are noted, right greater than left. The central airways are patent. Musculoskeletal: No acute or destructive bony lesions. Reconstructed images demonstrate no additional findings. Review of the MIP images confirms the above findings. CT ABDOMEN and PELVIS FINDINGS Hepatobiliary: 4.2 cm cyst within the caudate lobe of the liver. Otherwise liver is unremarkable.  No biliary dilation. The gallbladder is normal. Pancreas: Unremarkable. No pancreatic ductal dilatation or surrounding inflammatory changes. Spleen: Normal in size without focal abnormality. Adrenals/Urinary Tract: Adrenal glands are unremarkable. Kidneys are normal, without renal calculi, focal lesion, or hydronephrosis. Bladder is unremarkable. Stomach/Bowel: No bowel obstruction or ileus. Normal appendix right lower quadrant. There is diffuse diverticulosis of  the descending and sigmoid colon without diverticulitis. Vascular/Lymphatic: Aortic atherosclerosis. No enlarged abdominal or pelvic lymph nodes. Reproductive: Prostate is unremarkable. Other: No free fluid or free gas.  No abdominal wall hernia. Musculoskeletal: No acute or destructive bony lesions. Reconstructed images demonstrate no additional findings. Review of the MIP images confirms the above findings. IMPRESSION: 1. No evidence of pulmonary embolus. 2. Borderline enlarged subcarinal lymph node, likely reactive. 3. Diverticulosis without diverticulitis. 4. Aortic Atherosclerosis (ICD10-I70.0) and Emphysema (ICD10-J43.9). Electronically Signed   By: Sharlet Salina M.D.   On: 04/03/2020 19:14   DG Chest Portable 1 View  Result Date: 04/03/2020 CLINICAL DATA:  Respiratory distress, weakness, short of breath EXAM: PORTABLE CHEST 1 VIEW COMPARISON:  None. FINDINGS: Single frontal view of the chest demonstrates an unremarkable cardiac silhouette. There is diffuse increased interstitial prominence throughout the lungs, without focal consolidation, effusion, or pneumothorax. There are no acute bony abnormalities. IMPRESSION: 1. Diffuse increased interstitial prominence, without focal lung consolidation. Findings are of uncertain acuity, but could reflect interstitial edema, atypical infection, or scarring. Electronically Signed   By: Sharlet Salina M.D.   On: 04/03/2020 15:51    EKG: Independently reviewed.  It shows sinus tachycardia with ventricular bigeminy.  No ST changes  Assessment/Plan Principal Problem:   Acute respiratory failure due to COVID-19 [facility]) Active Problems:   COPD with acute exacerbation (HCC)   Essential hypertension   Diabetes (HCC)     #1 acute respiratory failure secondary to COVID-19 infection: Patient most likely has COPD also which is exacerbated.  CT scan showed evidence of emphysema.  We will admit the patient and initiate protocol for COVID-19 including remdesivir,  dexamethasone, Olumiant, multivitamins and zinc.  Continue oxygen currently on BiPAP hopefully will titrate to oxygen by nasal cannula or other form of oxygen.  #2 COPD: Patient will be on steroids and breathing treatments.  Also azithromycin for possible COPD exacerbation.  #3 diabetes: Blood sugar is likely to get worse with dexamethasone.  Initiate sliding scale insulin and add Lantus as needed.  #4 hypertension: Confirm and resume home regimen.  #5 acute kidney injury: Probably prerenal.  Hydrate and monitor closely.   DVT prophylaxis: Lovenox Code Status: Full code Family Communication: No family at bedside Disposition Plan: Possibly home Consults called: None Admission status: Inpatient to stepdown  Severity of Illness: The appropriate patient status for this patient is INPATIENT. Inpatient status is judged to be reasonable and necessary in order to provide the required intensity of service to ensure the patient's safety. The patient's presenting symptoms, physical exam findings, and initial radiographic and laboratory data in the context of their chronic comorbidities is felt to place them at high risk for further clinical deterioration. Furthermore, it is not anticipated that the patient will be medically stable for discharge from the hospital within 2 midnights of admission. The following factors support the patient status of inpatient.   " The patient's presenting symptoms include shortness of breath and cough. " The worrisome physical exam findings include tachypnea with expiratory wheezing. " The initial radiographic and laboratory data are worrisome because of COVID-19 infection. " The chronic co-morbidities include diabetes and hypertension.   *  I certify that at the point of admission it is my clinical judgment that the patient will require inpatient hospital care spanning beyond 2 midnights from the point of admission due to high intensity of service, high risk for further  deterioration and high frequency of surveillance required.Lonia Blood MD [facility] Pager [phone]  If 7PM-7AM, please contact night-coverage [email or web address] Password [facility]  04/03/2020, 8:18 PM

## 2020-04-03 NOTE — ED Notes (Signed)
Pt back from CT

## 2020-04-04 DIAGNOSIS — U071 COVID-19: Secondary | ICD-10-CM

## 2020-04-04 DIAGNOSIS — J9600 Acute respiratory failure, unspecified whether with hypoxia or hypercapnia: Secondary | ICD-10-CM

## 2020-04-04 DIAGNOSIS — E0800 Diabetes mellitus due to underlying condition with hyperosmolarity without nonketotic hyperglycemic-hyperosmolar coma (NKHHC): Secondary | ICD-10-CM

## 2020-04-04 LAB — FERRITIN: Ferritin: 226 ng/mL (ref 24–336)

## 2020-04-04 LAB — COMPREHENSIVE METABOLIC PANEL
ALT: 63 U/L — ABNORMAL HIGH (ref 0–44)
AST: 63 U/L — ABNORMAL HIGH (ref 15–41)
Albumin: 2.5 g/dL — ABNORMAL LOW (ref 3.5–5.0)
Alkaline Phosphatase: 61 U/L (ref 38–126)
Anion gap: 14 (ref 5–15)
BUN: 21 mg/dL (ref 8–23)
CO2: 15 mmol/L — ABNORMAL LOW (ref 22–32)
Calcium: 7.8 mg/dL — ABNORMAL LOW (ref 8.9–10.3)
Chloride: 110 mmol/L (ref 98–111)
Creatinine, Ser: 1.49 mg/dL — ABNORMAL HIGH (ref 0.61–1.24)
GFR calc Af Amer: 49 mL/min — ABNORMAL LOW (ref >60)
GFR calc non Af Amer: 42 mL/min — ABNORMAL LOW (ref >60)
Glucose, Bld: 255 mg/dL — ABNORMAL HIGH (ref 70–99)
Potassium: 4.9 mmol/L (ref 3.5–5.1)
Sodium: 139 mmol/L (ref 135–145)
Total Bilirubin: 1.4 mg/dL — ABNORMAL HIGH (ref 0.3–1.2)
Total Protein: 7.1 g/dL (ref 6.5–8.1)

## 2020-04-04 LAB — URINALYSIS, COMPLETE (UACMP) WITH MICROSCOPIC
Bilirubin Urine: NEGATIVE
Glucose, UA: >=500 mg/dL — AB
Ketones, ur: 20 mg/dL — AB
Leukocytes,Ua: NEGATIVE
Nitrite: NEGATIVE
Protein, ur: >=300 mg/dL — AB
Specific Gravity, Urine: 1.027 (ref 1.005–1.030)
pH: 5 (ref 5.0–8.0)

## 2020-04-04 LAB — GLUCOSE, CAPILLARY
Glucose-Capillary: 239 mg/dL — ABNORMAL HIGH (ref 70–99)
Glucose-Capillary: 205 mg/dL — ABNORMAL HIGH (ref 70–99)
Glucose-Capillary: 222 mg/dL — ABNORMAL HIGH (ref 70–99)
Glucose-Capillary: 227 mg/dL — ABNORMAL HIGH (ref 70–99)

## 2020-04-04 LAB — CBC WITH DIFFERENTIAL/PLATELET
Abs Immature Granulocytes: 0.02 10*3/uL (ref 0.00–0.07)
Basophils Absolute: 0 10*3/uL (ref 0.0–0.1)
Basophils Relative: 0 %
Eosinophils Absolute: 0 10*3/uL (ref 0.0–0.5)
Eosinophils Relative: 0 %
HCT: 34.7 % — ABNORMAL LOW (ref 39.0–52.0)
Hemoglobin: 11.3 g/dL — ABNORMAL LOW (ref 13.0–17.0)
Immature Granulocytes: 1 %
Lymphocytes Relative: 18 %
Lymphs Abs: 0.4 10*3/uL — ABNORMAL LOW (ref 0.7–4.0)
MCH: 21.5 pg — ABNORMAL LOW (ref 26.0–34.0)
MCHC: 32.6 g/dL (ref 30.0–36.0)
MCV: 66 fL — ABNORMAL LOW (ref 80.0–100.0)
Monocytes Absolute: 0.2 10*3/uL (ref 0.1–1.0)
Monocytes Relative: 10 %
Neutro Abs: 1.7 10*3/uL (ref 1.7–7.7)
Neutrophils Relative %: 71 %
Platelets: 282 10*3/uL (ref 150–400)
RBC: 5.26 MIL/uL (ref 4.22–5.81)
RDW: 22.4 % — ABNORMAL HIGH (ref 11.5–15.5)
Smear Review: NORMAL
WBC: 2.3 10*3/uL — ABNORMAL LOW (ref 4.0–10.5)
nRBC: 0 % (ref 0.0–0.2)

## 2020-04-04 LAB — FIBRIN DERIVATIVES D-DIMER (ARMC ONLY): Fibrin derivatives D-dimer (ARMC): 3264.03 ng/mL (FEU) — ABNORMAL HIGH (ref 0.00–499.00)

## 2020-04-04 LAB — C-REACTIVE PROTEIN
CRP: 15.1 mg/dL — ABNORMAL HIGH (ref <1.0)
CRP: 12.9 mg/dL — ABNORMAL HIGH (ref <1.0)

## 2020-04-04 LAB — BLOOD GAS, ARTERIAL
Acid-base deficit: 9.2 mmol/L — ABNORMAL HIGH (ref 0.0–2.0)
Bicarbonate: 12.9 mmol/L — ABNORMAL LOW (ref 20.0–28.0)
FIO2: 0.7
O2 Saturation: 92.6 %
Patient temperature: 37
pCO2 arterial: 19 mmHg — CL (ref 32.0–48.0)
pH, Arterial: 7.44 (ref 7.350–7.450)
pO2, Arterial: 63 mmHg — ABNORMAL LOW (ref 83.0–108.0)

## 2020-04-04 LAB — MAGNESIUM: Magnesium: 2.2 mg/dL (ref 1.7–2.4)

## 2020-04-04 LAB — PHOSPHORUS: Phosphorus: 3.3 mg/dL (ref 2.5–4.6)

## 2020-04-04 LAB — HEPATITIS B SURFACE ANTIGEN: Hepatitis B Surface Ag: NONREACTIVE

## 2020-04-04 LAB — LACTIC ACID, PLASMA
Lactic Acid, Venous: 3 mmol/L (ref 0.5–1.9)
Lactic Acid, Venous: 3.1 mmol/L (ref 0.5–1.9)

## 2020-04-04 LAB — HEMOGLOBIN A1C
Hgb A1c MFr Bld: 7.1 % — ABNORMAL HIGH (ref 4.8–5.6)
Mean Plasma Glucose: 157.07 mg/dL

## 2020-04-04 LAB — PROCALCITONIN: Procalcitonin: 0.51 ng/mL

## 2020-04-04 MED ORDER — INSULIN ASPART 100 UNIT/ML ~~LOC~~ SOLN
0.0000 [IU] | Freq: Three times a day (TID) | SUBCUTANEOUS | Status: DC
  Administered 2020-04-04: 5 [IU] via SUBCUTANEOUS
  Administered 2020-04-05: 8 [IU] via SUBCUTANEOUS
  Administered 2020-04-05: 3 [IU] via SUBCUTANEOUS
  Administered 2020-04-05: 8 [IU] via SUBCUTANEOUS
  Administered 2020-04-06: 3 [IU] via SUBCUTANEOUS
  Administered 2020-04-06 (×2): 5 [IU] via SUBCUTANEOUS
  Administered 2020-04-07: 2 [IU] via SUBCUTANEOUS
  Administered 2020-04-07: 3 [IU] via SUBCUTANEOUS
  Administered 2020-04-07: 2 [IU] via SUBCUTANEOUS
  Administered 2020-04-08 – 2020-04-09 (×3): 3 [IU] via SUBCUTANEOUS
  Administered 2020-04-10: 5 [IU] via SUBCUTANEOUS
  Administered 2020-04-10: 15 [IU] via SUBCUTANEOUS
  Administered 2020-04-11: 5 [IU] via SUBCUTANEOUS
  Administered 2020-04-11 – 2020-04-12 (×2): 8 [IU] via SUBCUTANEOUS
  Administered 2020-04-12: 3 [IU] via SUBCUTANEOUS
  Administered 2020-04-13: 8 [IU] via SUBCUTANEOUS
  Administered 2020-04-13: 2 [IU] via SUBCUTANEOUS
  Administered 2020-04-13: 15 [IU] via SUBCUTANEOUS
  Administered 2020-04-14: 11 [IU] via SUBCUTANEOUS
  Administered 2020-04-14 – 2020-04-15 (×2): 3 [IU] via SUBCUTANEOUS
  Administered 2020-04-15: 15 [IU] via SUBCUTANEOUS
  Administered 2020-04-16: 11 [IU] via SUBCUTANEOUS
  Administered 2020-04-16: 8 [IU] via SUBCUTANEOUS
  Administered 2020-04-16: 11 [IU] via SUBCUTANEOUS
  Administered 2020-04-17 (×2): 8 [IU] via SUBCUTANEOUS
  Administered 2020-04-17: 3 [IU] via SUBCUTANEOUS
  Administered 2020-04-18: 8 [IU] via SUBCUTANEOUS
  Administered 2020-04-18: 3 [IU] via SUBCUTANEOUS
  Administered 2020-04-19: 2 [IU] via SUBCUTANEOUS
  Administered 2020-04-19 – 2020-04-20 (×3): 5 [IU] via SUBCUTANEOUS
  Administered 2020-04-20: 3 [IU] via SUBCUTANEOUS
  Administered 2020-04-20: 11 [IU] via SUBCUTANEOUS
  Administered 2020-04-21: 5 [IU] via SUBCUTANEOUS
  Administered 2020-04-21: 8 [IU] via SUBCUTANEOUS
  Administered 2020-04-22: 2 [IU] via SUBCUTANEOUS
  Administered 2020-04-22: 3 [IU] via SUBCUTANEOUS
  Administered 2020-04-23: 11 [IU] via SUBCUTANEOUS
  Administered 2020-04-23: 5 [IU] via SUBCUTANEOUS
  Administered 2020-04-24: 2 [IU] via SUBCUTANEOUS
  Administered 2020-04-24 (×2): 3 [IU] via SUBCUTANEOUS
  Filled 2020-04-04 (×49): qty 1

## 2020-04-04 MED ORDER — INSULIN DETEMIR 100 UNIT/ML ~~LOC~~ SOLN
10.0000 [IU] | Freq: Every day | SUBCUTANEOUS | Status: DC
  Filled 2020-04-04 (×2): qty 0.1

## 2020-04-04 MED ORDER — METHYLPREDNISOLONE SODIUM SUCC 125 MG IJ SOLR
60.0000 mg | Freq: Two times a day (BID) | INTRAMUSCULAR | Status: DC
  Administered 2020-04-04 – 2020-04-05 (×2): 60 mg via INTRAVENOUS
  Filled 2020-04-04 (×2): qty 2

## 2020-04-04 MED ORDER — SODIUM CHLORIDE 0.9 % IV SOLN
1.0000 g | INTRAVENOUS | Status: AC
  Administered 2020-04-04 – 2020-04-08 (×5): 1 g via INTRAVENOUS
  Filled 2020-04-04: qty 1
  Filled 2020-04-04 (×2): qty 10
  Filled 2020-04-04: qty 1
  Filled 2020-04-04 (×2): qty 10

## 2020-04-04 MED ORDER — APIXABAN 5 MG PO TABS
5.0000 mg | ORAL_TABLET | Freq: Two times a day (BID) | ORAL | Status: DC
  Administered 2020-04-04 – 2020-04-24 (×39): 5 mg via ORAL
  Filled 2020-04-04 (×40): qty 1

## 2020-04-04 MED ORDER — HYDRALAZINE HCL 25 MG PO TABS
25.0000 mg | ORAL_TABLET | Freq: Four times a day (QID) | ORAL | Status: DC | PRN
  Administered 2020-04-08: 25 mg via ORAL
  Filled 2020-04-04: qty 1

## 2020-04-04 MED ORDER — LACTATED RINGERS IV SOLN: INTRAVENOUS | Status: DC

## 2020-04-04 MED ORDER — SODIUM CHLORIDE 0.9 % IV SOLN: INTRAVENOUS | Status: DC

## 2020-04-04 MED ORDER — LINAGLIPTIN 5 MG PO TABS
5.0000 mg | ORAL_TABLET | Freq: Every day | ORAL | Status: DC
  Administered 2020-04-05 – 2020-04-24 (×18): 5 mg via ORAL
  Filled 2020-04-04 (×20): qty 1

## 2020-04-04 NOTE — ED Notes (Signed)
Lab contacted at this time to collect cultures

## 2020-04-04 NOTE — Progress Notes (Addendum)
PROGRESS NOTE    Stephen Paul  UGQ:[id number] DOB: 1931-09-12 DOA: 04/03/2020 PCP: Barbette Reichmann, MD   Brief Narrative: Taken from H&P Stephen Paul is a 86 y.o. male with medical history significant of diabetes and hypertension who apparently was told about a week ago that he was positive for COVID-19.  Came to ED with worsening shortness of breath.  Found to be hypoxic in 70s.  Chest x-ray consistent with COVID-19 pneumonia.  CTA negative. Patient is unvaccinated.  Subjective: Patient was quite tachypneic, tachycardic when I entered the room.  He desaturated significantly when taken off from BiPAP on non rebreather, improved with replacement of BiPAP.  Mentation seems okay.  Assessment & Plan:   Principal Problem:   Acute respiratory failure due to COVID-19 [facility]) Active Problems:   COPD with acute exacerbation (HCC)   Essential hypertension   Diabetes (HCC)  Acute hypoxic respiratory failure secondary to COVID-19 pneumonia/sepsis secondary to COVID-19 infection. Patient did meet sepsis criteria on presentation with being tachycardic, tachypneic, endorgan damage with lactic acidosis, AKI, elevated T bili and COVID-19 infection.  Patient did not receive any fluid on admission.  No cultures obtained.  Procalcitonin elevated at 0.51.  Elevated inflammatory markers.  UA was positive for ketones but not very impressive for any infection. Patient continued to require BiPAP to maintain saturation above 90%. Remained high risk for intubation. -Continue remdesivir and Solu-Medrol- day 2 -Continue baricitinib-patient has mildly elevated procalcitonin, we will start him on antibiotics with continuation of baricitinib at this time if continued to get worse then we have to stop baricitinib. -Continue BiPAP at this time-try heated high flow nasal cannula and see if he maintain saturation. -Continue supportive care. -Continue supplements. -Continue to monitor inflammatory markers. -Start  him on ceftriaxone. -Continue Zithromax -Start him on IV fluid. -Monitor lactic acid till it normalized. -Get ABG. -Obtain blood cultures.  Non anion gap metabolic acidosis/lactic acidosis.  Bicarb remained at 15.  Patient did not received any fluids since admission. -Give him some fluid. -Monitor lactic acid. -Continue to monitor-we will consider bicarb infusion if worsening acidosis.    Type 2 diabetes mellitus.  Patient to have ketones in his urine most likely secondary to poor p.o. intake.  No anion gap.  A1c of 7.1.  CBG elevated as patient is on steroid.  Patient was on Metformin and glipizide at home. -Start him on Levemir 10 units at bedtime. -Switch him to moderate scale sliding scale. -Continue to monitor.  Elevated troponin.  Mildly elevated troponin with a flat curve at 19. Most likely secondary to demand as there is no chest pain.  Hypertension.  Blood pressure mildly elevated.  I cannot see any antihypertensives on home meds.  Med rec has not been done. -Ask pharmacist for med rec completion. -Add as needed hydralazine.  AKI.  Worsening creatinine, at 1.49 this morning with baseline around 1. -Give him some fluid. -Continue to monitor -Avoid nephrotoxins.  History of DVT.  Was on Eliquis at home due to history of DVT in February 2021.  CTA negative for acute PE. -Continue home dose of Eliquis.  Objective: Vitals:   04/03/20 2219 04/03/20 2316 04/04/20 0255 04/04/20 0625  BP: (!) 142/84 (!) 161/91 (!) 144/83 (!) 153/79  Pulse: 87 83 79 69  Resp: (!) 25 (!) 23 (!) 23 (!) 25  Temp:      SpO2: 99% 96% 94% 93%  Weight:      Height:        Intake/Output  Summary (Last 24 hours) at 04/04/2020 0800 Last data filed at 04/03/2020 2315 Gross per 24 hour  Intake 750 ml  Output --  Net 750 ml   Filed Weights   04/03/20 1526  Weight: 68 kg    Examination:  General exam: Chronically ill-appearing gentleman, appears anxious. Respiratory system: Bilateral scattered  rhonchi, mildly increased work of breathing. Cardiovascular system: S1 & S2 heard, RRR. No JVD, murmurs, Gastrointestinal system: Soft, nontender, nondistended, bowel sounds positive. Central nervous system: Alert and oriented. No focal neurological deficits. Extremities: Trace LE edema, no cyanosis, pulses intact and symmetrical. Skin: No rashes, lesions or ulcers Psychiatry: Judgement and insight appear normal. Mood & affect appropriate.    DVT prophylaxis: Eliquis Code Status: Full Family Communication: Wife was updated at phone. Disposition Plan:  Status is: Inpatient  Remains inpatient appropriate because:Inpatient level of care appropriate due to severity of illness   Dispo: The patient is from: Home              Anticipated d/c is to: Home              Anticipated d/c date is: 3 days              Patient currently is not medically stable to d/c.  Patient is very high risk for deterioration and death.  Discussed with wife for adverse outcome. I also consulted palliative to discuss goals of care.  It looks like patient and his wife does not has much insight.  Consultants:   Pulmonary  Procedures:  Antimicrobials:  Ceftriaxone Zithromax  Data Reviewed: I have personally reviewed following labs and imaging studies  CBC: Recent Labs  Lab 04/03/20 1530 04/04/20 0436  WBC 5.1 2.3*  NEUTROABS 3.9 1.7  HGB 12.7* 11.3*  HCT 38.6* 34.7*  MCV 64.5* 66.0*  PLT 305 282   Basic Metabolic Panel: Recent Labs  Lab 04/03/20 1530 04/04/20 0436  NA 139 139  K 4.4 4.9  CL 109 110  CO2 15* 15*  GLUCOSE 187* 255*  BUN 15 21  CREATININE 1.37* 1.49*  CALCIUM 8.2* 7.8*  MG  --  2.2  PHOS  --  3.3   GFR: Estimated Creatinine Clearance: 32.1 mL/min (A) (by C-G formula based on SCr of 1.49 mg/dL (H)). Liver Function Tests: Recent Labs  Lab 04/03/20 1530 04/04/20 0436  AST 102* 63*  ALT 84* 63*  ALKPHOS 67 61  BILITOT 1.0 1.4*  PROT 7.8 7.1  ALBUMIN 2.8* 2.5*   No  results for input(s): LIPASE, AMYLASE in the last 168 hours. No results for input(s): AMMONIA in the last 168 hours. Coagulation Profile: No results for input(s): INR, PROTIME in the last 168 hours. Cardiac Enzymes: No results for input(s): CKTOTAL, CKMB, CKMBINDEX, TROPONINI in the last 168 hours. BNP (last 3 results) No results for input(s): PROBNP in the last 8760 hours. HbA1C: Recent Labs    04/03/20 2215  HGBA1C 7.1*   CBG: Recent Labs  Lab 04/03/20 2109  GLUCAP 196*   Lipid Profile: No results for input(s): CHOL, HDL, LDLCALC, TRIG, CHOLHDL, LDLDIRECT in the last 72 hours. Thyroid Function Tests: No results for input(s): TSH, T4TOTAL, FREET4, T3FREE, THYROIDAB in the last 72 hours. Anemia Panel: Recent Labs    04/03/20 2215 04/04/20 0436  FERRITIN 224 226   Sepsis Labs: Recent Labs  Lab 04/03/20 1530 04/03/20 1832 04/03/20 2215  PROCALCITON  --   --  0.51  LATICACIDVEN 4.4* 3.3*  --  Recent Results (from the past 240 hour(s))  SARS Coronavirus 2 by RT PCR (hospital order, performed in [facility] hospital lab) Nasopharyngeal Nasopharyngeal Swab     Status: Abnormal   Collection Time: 04/03/20  3:30 PM   Specimen: Nasopharyngeal Swab  Result Value Ref Range Status   SARS Coronavirus 2 POSITIVE (A) NEGATIVE Final    Comment: RESULT CALLED TO, READ BACK BY AND VERIFIED WITH: SAMANTHA HAMILTON 04/03/20 AT 1748 BY ACR (NOTE) SARS-CoV-2 target nucleic acids are DETECTED  SARS-CoV-2 RNA is generally detectable in upper respiratory specimens  during the acute phase of infection.  Positive results are indicative  of the presence of the identified virus, but do not rule out bacterial infection or co-infection with other pathogens not detected by the test.  Clinical correlation with patient history and  other diagnostic information is necessary to determine patient infection status.  The expected result is negative.  Fact Sheet for Patients:     BoilerBrush.com.cy   Fact Sheet for Healthcare Providers:   https://pope.com/    This test is not yet approved or cleared by the [place] FDA and  has been authorized for detection and/or diagnosis of SARS-CoV-2 by FDA under an Emergency Use Authorization (EUA).  This EUA will remain in effect (meaning th is test can be used) for the duration of  the COVID-19 declaration under Section 564(b)(1) of the Act, 21 U.S.C. section 360-bbb-3(b)(1), unless the authorization is terminated or revoked sooner.  Performed at [facility], [address] [place], [place] [address]      Radiology Studies: CT Angio Chest PE W and/or Wo Contrast  Result Date: 04/03/2020 CLINICAL DATA:  COVID-19 positive, shortness of breath, weakness, hypoxia, abdominal pain EXAM: CT ANGIOGRAPHY CHEST CT ABDOMEN AND PELVIS WITH CONTRAST TECHNIQUE: Multidetector CT imaging of the chest was performed using the standard protocol during bolus administration of intravenous contrast. Multiplanar CT image reconstructions and MIPs were obtained to evaluate the vascular anatomy. Multidetector CT imaging of the abdomen and pelvis was performed using the standard protocol during bolus administration of intravenous contrast. CONTRAST:  75mL OMNIPAQUE IOHEXOL 350 MG/ML SOLN COMPARISON:  04/03/2020 FINDINGS: CTA CHEST FINDINGS Cardiovascular: This is a technically adequate evaluation of the pulmonary vasculature. No filling defects or pulmonary emboli. The heart is unremarkable without pericardial effusion. Normal caliber of the thoracic aorta. Mediastinum/Nodes: Mildly enlarged subcarinal lymph node measures 13 mm in short axis. Trachea, esophagus, and thyroid are unremarkable. Lungs/Pleura: There is severe upper lobe predominant emphysema. No acute airspace disease, effusion, or pneumothorax. Bibasilar areas of scarring are noted, right greater than left. The central airways are  patent. Musculoskeletal: No acute or destructive bony lesions. Reconstructed images demonstrate no additional findings. Review of the MIP images confirms the above findings. CT ABDOMEN and PELVIS FINDINGS Hepatobiliary: 4.2 cm cyst within the caudate lobe of the liver. Otherwise liver is unremarkable. No biliary dilation. The gallbladder is normal. Pancreas: Unremarkable. No pancreatic ductal dilatation or surrounding inflammatory changes. Spleen: Normal in size without focal abnormality. Adrenals/Urinary Tract: Adrenal glands are unremarkable. Kidneys are normal, without renal calculi, focal lesion, or hydronephrosis. Bladder is unremarkable. Stomach/Bowel: No bowel obstruction or ileus. Normal appendix right lower quadrant. There is diffuse diverticulosis of the descending and sigmoid colon without diverticulitis. Vascular/Lymphatic: Aortic atherosclerosis. No enlarged abdominal or pelvic lymph nodes. Reproductive: Prostate is unremarkable. Other: No free fluid or free gas.  No abdominal wall hernia. Musculoskeletal: No acute or destructive bony lesions. Reconstructed images demonstrate no additional findings. Review  of the MIP images confirms the above findings. IMPRESSION: 1. No evidence of pulmonary embolus. 2. Borderline enlarged subcarinal lymph node, likely reactive. 3. Diverticulosis without diverticulitis. 4. Aortic Atherosclerosis (ICD10-I70.0) and Emphysema (ICD10-J43.9). Electronically Signed   By: Sharlet SalinaMichael  Brown M.D.   On: 04/03/2020 19:14   CT ABDOMEN PELVIS W CONTRAST  Result Date: 04/03/2020 CLINICAL DATA:  COVID-19 positive, shortness of breath, weakness, hypoxia, abdominal pain EXAM: CT ANGIOGRAPHY CHEST CT ABDOMEN AND PELVIS WITH CONTRAST TECHNIQUE: Multidetector CT imaging of the chest was performed using the standard protocol during bolus administration of intravenous contrast. Multiplanar CT image reconstructions and MIPs were obtained to evaluate the vascular anatomy. Multidetector CT  imaging of the abdomen and pelvis was performed using the standard protocol during bolus administration of intravenous contrast. CONTRAST:  75mL OMNIPAQUE IOHEXOL 350 MG/ML SOLN COMPARISON:  04/03/2020 FINDINGS: CTA CHEST FINDINGS Cardiovascular: This is a technically adequate evaluation of the pulmonary vasculature. No filling defects or pulmonary emboli. The heart is unremarkable without pericardial effusion. Normal caliber of the thoracic aorta. Mediastinum/Nodes: Mildly enlarged subcarinal lymph node measures 13 mm in short axis. Trachea, esophagus, and thyroid are unremarkable. Lungs/Pleura: There is severe upper lobe predominant emphysema. No acute airspace disease, effusion, or pneumothorax. Bibasilar areas of scarring are noted, right greater than left. The central airways are patent. Musculoskeletal: No acute or destructive bony lesions. Reconstructed images demonstrate no additional findings. Review of the MIP images confirms the above findings. CT ABDOMEN and PELVIS FINDINGS Hepatobiliary: 4.2 cm cyst within the caudate lobe of the liver. Otherwise liver is unremarkable. No biliary dilation. The gallbladder is normal. Pancreas: Unremarkable. No pancreatic ductal dilatation or surrounding inflammatory changes. Spleen: Normal in size without focal abnormality. Adrenals/Urinary Tract: Adrenal glands are unremarkable. Kidneys are normal, without renal calculi, focal lesion, or hydronephrosis. Bladder is unremarkable. Stomach/Bowel: No bowel obstruction or ileus. Normal appendix right lower quadrant. There is diffuse diverticulosis of the descending and sigmoid colon without diverticulitis. Vascular/Lymphatic: Aortic atherosclerosis. No enlarged abdominal or pelvic lymph nodes. Reproductive: Prostate is unremarkable. Other: No free fluid or free gas.  No abdominal wall hernia. Musculoskeletal: No acute or destructive bony lesions. Reconstructed images demonstrate no additional findings. Review of the MIP  images confirms the above findings. IMPRESSION: 1. No evidence of pulmonary embolus. 2. Borderline enlarged subcarinal lymph node, likely reactive. 3. Diverticulosis without diverticulitis. 4. Aortic Atherosclerosis (ICD10-I70.0) and Emphysema (ICD10-J43.9). Electronically Signed   By: Sharlet SalinaMichael  Brown M.D.   On: 04/03/2020 19:14   DG Chest Portable 1 View  Result Date: 04/03/2020 CLINICAL DATA:  Respiratory distress, weakness, short of breath EXAM: PORTABLE CHEST 1 VIEW COMPARISON:  None. FINDINGS: Single frontal view of the chest demonstrates an unremarkable cardiac silhouette. There is diffuse increased interstitial prominence throughout the lungs, without focal consolidation, effusion, or pneumothorax. There are no acute bony abnormalities. IMPRESSION: 1. Diffuse increased interstitial prominence, without focal lung consolidation. Findings are of uncertain acuity, but could reflect interstitial edema, atypical infection, or scarring. Electronically Signed   By: Sharlet SalinaMichael  Brown M.D.   On: 04/03/2020 15:51    Scheduled Meds: . baricitinib  2 mg Oral Daily  . dexamethasone (DECADRON) injection  6 mg Intravenous Q24H  . enoxaparin (LOVENOX) injection  40 mg Subcutaneous Q24H  . folic acid  1 mg Oral Daily  . insulin aspart  0-5 Units Subcutaneous QHS  . insulin aspart  0-9 Units Subcutaneous TID WC  . Ipratropium-Albuterol  1 puff Inhalation Q6H  . multivitamin with minerals  1 tablet Oral  Daily  . thiamine  100 mg Oral Daily   Continuous Infusions: . sodium chloride    . azithromycin Stopped (04/03/20 2215)  . cefTRIAXone (ROCEPHIN)  IV    . remdesivir 100 mg in NS 100 mL    . remdesivir 100 mg in NS 100 mL       LOS: 1 day   Time spent: 45 minutes  Arnetha Courser, MD [facility]  If 7PM-7AM, please contact night-coverage [email or web address]  04/04/2020, 8:00 AM   This record has been created using Conservation officer, historic buildings. Errors have been sought and corrected,but may not  always be located. Such creation errors do not reflect on the standard of care.

## 2020-04-04 NOTE — Progress Notes (Signed)
Inpatient Diabetes Program Recommendations  AACE/ADA: New Consensus Statement on Inpatient Glycemic Control (2015)  Target Ranges:  Prepandial:   less than 140 mg/dL      Peak postprandial:   less than 180 mg/dL (1-2 hours)      Critically ill patients:  140 - 180 mg/dL   Lab Results  Component Value Date   GLUCAP 239 (H) 04/04/2020   HGBA1C 7.1 (H) 04/03/2020    Review of Glycemic Control Results for Stephen Paul, Stephen Paul (MRN [id number]) as of 04/04/2020 11:59  Ref. Range 04/03/2020 21:09 04/04/2020 08:25  Glucose-Capillary Latest Ref Range: 70 - 99 mg/dL 476 (H) 546 (H)  Diabetes history: Type 2 DM Outpatient Diabetes medications: Glipizide 10 mg QD, Metformin 1000 mg BID Current orders for Inpatient glycemic control: Novolog 0-9 units TID, Novolog 0-5 units QHS Decadron 6 mg QD  Inpatient Diabetes Program Recommendations:    In the setting of steroids, anticipate glucose trends to increase. Consider increasing correction to Novolog 0-15 units TID and adding Tradjenta 5 mg QD.  Will follow.   Thanks, Lujean Rave, MSN, RNC-OB Diabetes Coordinator [phone] (8a-5p)

## 2020-04-04 NOTE — ED Notes (Signed)
Cultures still not collected at this time per lab. Waiting to admin antibiotic until pt blood cultures are obtained

## 2020-04-04 NOTE — Consult Note (Signed)
Name: Stephen Paul MRN: [id number] DOB: 08/17/1931     CONSULTATION DATE:  Cindi CarbonEFERRING MD : Nelson ChimesAmin  CHIEF COMPLAINT: resp distress  STUDIES:     CXR independently reviewed by Me b/l interstitial infiltrates    HISTORY OF PRESENT ILLNESS: 86 y.o. male with medical history significant of diabetes and hypertension who apparently was told about a week ago that he was positive for COVID-19.    Never had any symptoms did not feel any worse until now.   His shortness of breath was progressive and got worse.    On arrival in the ER his oxygen sat was in the 70s.   He is having some cough.  He also reported subjective fever at home.  He is having abdominal pain as well.    Patient was evaluated again and found to be COVID-19 positive in the ER.  Patient also has had some loss of appetite.    Currently on High flow [place]  with oxygen sat at 100%.b dx with acute respiratory failure secondary to COVID-19 infection.    ED Course: Temperature 98 blood pressure 173/90 pulse 90 respirate 29 oxygen sat 90% on room air.  White count 5.1 his hemoglobin 12.7 and platelets of 305.  Chemistry showed CO2 15 creatinine 1.37 calcium 8.2.  COVID-19 screen is positive.  Fibrin diabetes 2643.  CT abdomen and pelvis as well as CT angiogram of the chest not significant changes.  Lactic acid is 4.4 troponin of 19.  BNP 193.  Chest x-ray showed bilateral infiltrates.  Patient being admitted to the hospital with acute respiratory failure secondary to COVID-19 infection.  Patient looks ill, hard to understand has garbled speech Prognosis is very poor, high chance of dying PAST MEDICAL HISTORY :   has a past medical history of Diabetes mellitus without complication (HCC) and Hypertension.  has no past surgical history on file. Prior to Admission medications   Medication Sig Start Date End Date Taking? Authorizing Provider  apixaban (ELIQUIS) 5 MG TABS tablet Take 5 mg by mouth every 12 (twelve) hours. 09/18/19  Yes  [provider]  aspirin 81 MG EC tablet Take 81 mg by mouth daily.   Yes [provider]  cyanocobalamin 100 MCG tablet Take 1,000 mcg by mouth daily.   Yes [provider]  ferrous sulfate 325 (65 FE) MG EC tablet Take 325 mg by mouth daily with breakfast. 12/26/19 06/23/20 Yes [provider]  gabapentin (NEURONTIN) 100 MG capsule Take 200 mg by mouth at bedtime. 02/02/20  Yes [provider]  glipiZIDE (GLUCOTROL) 10 MG tablet Take 10 mg by mouth every morning. 03/18/20  Yes [provider]  metFORMIN (GLUCOPHAGE) 1000 MG tablet Take 1,000 mg by mouth 2 (two) times daily with a meal. 11/23/16  Yes [provider]  omeprazole (PRILOSEC) 20 MG capsule Take 20 mg by mouth daily. 03/18/20  Yes [provider]  polyvinyl alcohol (LIQUIFILM TEARS) 1.4 % ophthalmic solution Place 1 drop into both eyes as needed.   Yes [provider]  traMADol (ULTRAM) 50 MG tablet Take 50 mg by mouth in the morning and at bedtime. 11/25/16  Yes [provider]   Allergies  Allergen Reactions  . Mycophenolate Mofetil Rash    FAMILY HISTORY:  family history is not on file. SOCIAL HISTORY:  reports that he has never smoked. He has never used smokeless tobacco. He reports that he does not drink alcohol and does not use drugs.  Review of Systems:  LIMITED DUE TO RESP DISTRESS AND ILL APPEARING  Other:  All other systems negative     VITAL SIGNS: Pulse Rate:  [69-91] 81 (09/09 1330) Resp:  [18-26] 21 (09/09 1330) BP: (136-161)/(79-105) 159/89 (09/09 1330) SpO2:  [85 %-99 %] 94 % (09/09 1330) FiO2 (%):  [70 %] 70 % (09/09 1039)     SpO2: 94 % O2 Flow Rate (L/min): 45 L/min FiO2 (%): 70 %   COVID-19 DISASTER DECLARATION:   FULL CONTACT PHYSICAL EXAMINATION WAS NOT POSSIBLE DUE TO TREATMENT OF COVID-19 AND   CONSERVATION OF PERSONAL PROTECTIVE EQUIPMENT, LIMITED EXAM FINDINGS INCLUDE-   Patient assessed or the  symptoms described in the history of present illness.   In the context of the Global COVID-19 pandemic, which necessitated consideration that the patient might be at risk for infection with the SARS-CoV-2 virus that causes COVID-19, Institutional protocols and algorithms that pertain to the evaluation of patients at risk for COVID-19 are in a state of rapid change based on information released by regulatory bodies including the CDC and federal and state organizations. These policies and algorithms were followed during the patient's care while in hospital.    PHYSICAL EXAMINATION:  GENERAL:critically ill appearing, +resp distress PULMONARY: limited exam  CARDIOVASCULAR: S1 and S2. Regular rate and rhythm. No murmurs, rubs, or gallops.  GASTROINTESTINAL: Soft, nontender, -distended. Positive bowel sounds.  MUSCULOSKELETAL: No swelling, clubbing, or edema.  NEUROLOGIC: awake, lethargic SKIN:intact,warm,dry     MEDICATIONS: I have reviewed all medications and confirmed regimen as documented    CULTURE RESULTS   Recent Results (from the past 240 hour(s))  SARS Coronavirus 2 by RT PCR (hospital order, performed in [facility] hospital lab) Nasopharyngeal Nasopharyngeal Swab     Status: Abnormal   Collection Time: 04/03/20  3:30 PM   Specimen: Nasopharyngeal Swab  Result Value Ref Range Status   SARS Coronavirus 2 POSITIVE (A) NEGATIVE Final    Comment: RESULT CALLED TO, READ BACK BY AND VERIFIED WITH: SAMANTHA HAMILTON 04/03/20 AT 1748 BY ACR (NOTE) SARS-CoV-2 target nucleic acids are DETECTED  SARS-CoV-2 RNA is generally detectable in upper respiratory specimens  during the acute phase of infection.  Positive results are indicative  of the presence of the identified virus, but do not rule out bacterial infection or co-infection with other pathogens not detected by the test.  Clinical correlation with patient history and  other diagnostic information is necessary to determine  patient infection status.  The expected result is negative.  Fact Sheet for Patients:   BoilerBrush.com.cy   Fact Sheet for Healthcare Providers:   https://pope.com/    This test is not yet approved or cleared by the [place] FDA and  has been authorized for detection and/or diagnosis of SARS-CoV-2 by FDA under an Emergency Use Authorization (EUA).  This EUA will remain in effect (meaning th is test can be used) for the duration of  the COVID-19 declaration under Section 564(b)(1) of the Act, 21 U.S.C. section 360-bbb-3(b)(1), unless the authorization is terminated or revoked sooner.  Performed at [facility], [address] [place], [place] [address]           IMAGING    CT Angio Chest PE W and/or Wo Contrast  Result Date: 04/03/2020 CLINICAL DATA:  COVID-19 positive, shortness of breath, weakness, hypoxia, abdominal pain EXAM: CT ANGIOGRAPHY CHEST CT ABDOMEN AND PELVIS WITH CONTRAST TECHNIQUE: Multidetector CT imaging of the chest was performed using the standard protocol during bolus  administration of intravenous contrast. Multiplanar CT image reconstructions and MIPs were obtained to evaluate the vascular anatomy. Multidetector CT imaging of the abdomen and pelvis was performed using the standard protocol during bolus administration of intravenous contrast. CONTRAST:  41mL OMNIPAQUE IOHEXOL 350 MG/ML SOLN COMPARISON:  04/03/2020 FINDINGS: CTA CHEST FINDINGS Cardiovascular: This is a technically adequate evaluation of the pulmonary vasculature. No filling defects or pulmonary emboli. The heart is unremarkable without pericardial effusion. Normal caliber of the thoracic aorta. Mediastinum/Nodes: Mildly enlarged subcarinal lymph node measures 13 mm in short axis. Trachea, esophagus, and thyroid are unremarkable. Lungs/Pleura: There is severe upper lobe predominant emphysema. No acute airspace disease, effusion, or  pneumothorax. Bibasilar areas of scarring are noted, right greater than left. The central airways are patent. Musculoskeletal: No acute or destructive bony lesions. Reconstructed images demonstrate no additional findings. Review of the MIP images confirms the above findings. CT ABDOMEN and PELVIS FINDINGS Hepatobiliary: 4.2 cm cyst within the caudate lobe of the liver. Otherwise liver is unremarkable. No biliary dilation. The gallbladder is normal. Pancreas: Unremarkable. No pancreatic ductal dilatation or surrounding inflammatory changes. Spleen: Normal in size without focal abnormality. Adrenals/Urinary Tract: Adrenal glands are unremarkable. Kidneys are normal, without renal calculi, focal lesion, or hydronephrosis. Bladder is unremarkable. Stomach/Bowel: No bowel obstruction or ileus. Normal appendix right lower quadrant. There is diffuse diverticulosis of the descending and sigmoid colon without diverticulitis. Vascular/Lymphatic: Aortic atherosclerosis. No enlarged abdominal or pelvic lymph nodes. Reproductive: Prostate is unremarkable. Other: No free fluid or free gas.  No abdominal wall hernia. Musculoskeletal: No acute or destructive bony lesions. Reconstructed images demonstrate no additional findings. Review of the MIP images confirms the above findings. IMPRESSION: 1. No evidence of pulmonary embolus. 2. Borderline enlarged subcarinal lymph node, likely reactive. 3. Diverticulosis without diverticulitis. 4. Aortic Atherosclerosis (ICD10-I70.0) and Emphysema (ICD10-J43.9). Electronically Signed   By: Sharlet Salina M.D.   On: 04/03/2020 19:14   CT ABDOMEN PELVIS W CONTRAST  Result Date: 04/03/2020 CLINICAL DATA:  COVID-19 positive, shortness of breath, weakness, hypoxia, abdominal pain EXAM: CT ANGIOGRAPHY CHEST CT ABDOMEN AND PELVIS WITH CONTRAST TECHNIQUE: Multidetector CT imaging of the chest was performed using the standard protocol during bolus administration of intravenous contrast. Multiplanar  CT image reconstructions and MIPs were obtained to evaluate the vascular anatomy. Multidetector CT imaging of the abdomen and pelvis was performed using the standard protocol during bolus administration of intravenous contrast. CONTRAST:  25mL OMNIPAQUE IOHEXOL 350 MG/ML SOLN COMPARISON:  04/03/2020 FINDINGS: CTA CHEST FINDINGS Cardiovascular: This is a technically adequate evaluation of the pulmonary vasculature. No filling defects or pulmonary emboli. The heart is unremarkable without pericardial effusion. Normal caliber of the thoracic aorta. Mediastinum/Nodes: Mildly enlarged subcarinal lymph node measures 13 mm in short axis. Trachea, esophagus, and thyroid are unremarkable. Lungs/Pleura: There is severe upper lobe predominant emphysema. No acute airspace disease, effusion, or pneumothorax. Bibasilar areas of scarring are noted, right greater than left. The central airways are patent. Musculoskeletal: No acute or destructive bony lesions. Reconstructed images demonstrate no additional findings. Review of the MIP images confirms the above findings. CT ABDOMEN and PELVIS FINDINGS Hepatobiliary: 4.2 cm cyst within the caudate lobe of the liver. Otherwise liver is unremarkable. No biliary dilation. The gallbladder is normal. Pancreas: Unremarkable. No pancreatic ductal dilatation or surrounding inflammatory changes. Spleen: Normal in size without focal abnormality. Adrenals/Urinary Tract: Adrenal glands are unremarkable. Kidneys are normal, without renal calculi, focal lesion, or hydronephrosis. Bladder is unremarkable. Stomach/Bowel: No bowel obstruction or ileus. Normal appendix right  lower quadrant. There is diffuse diverticulosis of the descending and sigmoid colon without diverticulitis. Vascular/Lymphatic: Aortic atherosclerosis. No enlarged abdominal or pelvic lymph nodes. Reproductive: Prostate is unremarkable. Other: No free fluid or free gas.  No abdominal wall hernia. Musculoskeletal: No acute or  destructive bony lesions. Reconstructed images demonstrate no additional findings. Review of the MIP images confirms the above findings. IMPRESSION: 1. No evidence of pulmonary embolus. 2. Borderline enlarged subcarinal lymph node, likely reactive. 3. Diverticulosis without diverticulitis. 4. Aortic Atherosclerosis (ICD10-I70.0) and Emphysema (ICD10-J43.9). Electronically Signed   By: Sharlet Salina M.D.   On: 04/03/2020 19:14   CBC    Component Value Date/Time   WBC 2.3 (L) 04/04/2020 0436   RBC 5.26 04/04/2020 0436   HGB 11.3 (L) 04/04/2020 0436   HCT 34.7 (L) 04/04/2020 0436   PLT 282 04/04/2020 0436   MCV 66.0 (L) 04/04/2020 0436   MCH 21.5 (L) 04/04/2020 0436   MCHC 32.6 04/04/2020 0436   RDW 22.4 (H) 04/04/2020 0436   LYMPHSABS 0.4 (L) 04/04/2020 0436   MONOABS 0.2 04/04/2020 0436   EOSABS 0.0 04/04/2020 0436   BASOSABS 0.0 04/04/2020 0436   BMP Latest Ref Rng & Units 04/04/2020 04/03/2020  Glucose 70 - 99 mg/dL 962(I) 297(L)  BUN 8 - 23 mg/dL 21 15  Creatinine 8.92 - 1.24 mg/dL 1.19(E) 1.74(Y)  Sodium 135 - 145 mmol/L 139 139  Potassium 3.5 - 5.1 mmol/L 4.9 4.4  Chloride 98 - 111 mmol/L 110 109  CO2 22 - 32 mmol/L 15(L) 15(L)  Calcium 8.9 - 10.3 mg/dL 7.8(L) 8.2(L)       ASSESSMENT AND PLAN SYNOPSIS  Severe COVID-19 infection, ARDS and pneumonia/pneumonitis with acute renal failure, patient with multiorgan failure Continue IV steroids  IV remdisivir  Aggressive pulm toilet recommended Pulmonary hygiene proning as tolerated due to severe hypoxia   Maintain airborne and contact precautions  As needed bronchodilators (MDI) Vitamin C and zinc Antitussives High risk for intubation and death  Prognosis is grave and poor, patient is suffering and suffocating Recommend DNR/DNI status, plan for comfort care measures.   Critical Care Time devoted to patient care services described in this note is 45 minutes.   Overall, patient is critically ill, prognosis is guarded.   Patient with Multiorgan failure and at high risk for cardiac arrest and death.    Lucie Leather, M.D.  [facility]  Medical Director [facility] Medical Director [facility] Cardio-Pulmonary Department

## 2020-04-04 NOTE — ED Notes (Signed)
Pt o2 sat continues to drop to 87% on non rebreather

## 2020-04-04 NOTE — ED Notes (Signed)
bipap placed back on pt due to work of breathing and oxygen sat of 85%. Dr Nelson Chimes at bedside states to attempt heated high flow and pronning pt on abd at this time

## 2020-04-05 DIAGNOSIS — Z7189 Other specified counseling: Secondary | ICD-10-CM

## 2020-04-05 DIAGNOSIS — U071 COVID-19: Secondary | ICD-10-CM

## 2020-04-05 DIAGNOSIS — Z66 Do not resuscitate: Secondary | ICD-10-CM | POA: Diagnosis not present

## 2020-04-05 DIAGNOSIS — Z515 Encounter for palliative care: Secondary | ICD-10-CM

## 2020-04-05 LAB — COMPREHENSIVE METABOLIC PANEL
ALT: 56 U/L — ABNORMAL HIGH (ref 0–44)
AST: 75 U/L — ABNORMAL HIGH (ref 15–41)
Albumin: 2.4 g/dL — ABNORMAL LOW (ref 3.5–5.0)
Alkaline Phosphatase: 61 U/L (ref 38–126)
Anion gap: 12 (ref 5–15)
BUN: 31 mg/dL — ABNORMAL HIGH (ref 8–23)
CO2: 17 mmol/L — ABNORMAL LOW (ref 22–32)
Calcium: 7.8 mg/dL — ABNORMAL LOW (ref 8.9–10.3)
Chloride: 110 mmol/L (ref 98–111)
Creatinine, Ser: 1.31 mg/dL — ABNORMAL HIGH (ref 0.61–1.24)
GFR calc Af Amer: 57 mL/min — ABNORMAL LOW (ref >60)
GFR calc non Af Amer: 49 mL/min — ABNORMAL LOW (ref >60)
Glucose, Bld: 270 mg/dL — ABNORMAL HIGH (ref 70–99)
Potassium: 5.3 mmol/L — ABNORMAL HIGH (ref 3.5–5.1)
Sodium: 139 mmol/L (ref 135–145)
Total Bilirubin: 1.1 mg/dL (ref 0.3–1.2)
Total Protein: 7.4 g/dL (ref 6.5–8.1)

## 2020-04-05 LAB — CBC WITH DIFFERENTIAL/PLATELET
Abs Immature Granulocytes: 0.03 10*3/uL (ref 0.00–0.07)
Basophils Absolute: 0 10*3/uL (ref 0.0–0.1)
Basophils Relative: 0 %
Eosinophils Absolute: 0 10*3/uL (ref 0.0–0.5)
Eosinophils Relative: 0 %
HCT: 34.3 % — ABNORMAL LOW (ref 39.0–52.0)
Hemoglobin: 11.6 g/dL — ABNORMAL LOW (ref 13.0–17.0)
Immature Granulocytes: 0 %
Lymphocytes Relative: 10 %
Lymphs Abs: 0.8 10*3/uL (ref 0.7–4.0)
MCH: 21.4 pg — ABNORMAL LOW (ref 26.0–34.0)
MCHC: 33.8 g/dL (ref 30.0–36.0)
MCV: 63.4 fL — ABNORMAL LOW (ref 80.0–100.0)
Monocytes Absolute: 0.6 10*3/uL (ref 0.1–1.0)
Monocytes Relative: 8 %
Neutro Abs: 6.2 10*3/uL (ref 1.7–7.7)
Neutrophils Relative %: 82 %
Platelets: 362 10*3/uL (ref 150–400)
RBC: 5.41 MIL/uL (ref 4.22–5.81)
RDW: 21.7 % — ABNORMAL HIGH (ref 11.5–15.5)
Smear Review: NORMAL
WBC: 7.5 10*3/uL (ref 4.0–10.5)
nRBC: 0 % (ref 0.0–0.2)

## 2020-04-05 LAB — FERRITIN: Ferritin: 239 ng/mL (ref 24–336)

## 2020-04-05 LAB — FIBRIN DERIVATIVES D-DIMER (ARMC ONLY): Fibrin derivatives D-dimer (ARMC): >7500 ng/mL (FEU) — ABNORMAL HIGH (ref 0.00–499.00)

## 2020-04-05 LAB — C-REACTIVE PROTEIN: CRP: 11.3 mg/dL — ABNORMAL HIGH (ref <1.0)

## 2020-04-05 LAB — GLUCOSE, CAPILLARY
Glucose-Capillary: 175 mg/dL — ABNORMAL HIGH (ref 70–99)
Glucose-Capillary: 186 mg/dL — ABNORMAL HIGH (ref 70–99)
Glucose-Capillary: 240 mg/dL — ABNORMAL HIGH (ref 70–99)
Glucose-Capillary: 282 mg/dL — ABNORMAL HIGH (ref 70–99)
Glucose-Capillary: 298 mg/dL — ABNORMAL HIGH (ref 70–99)

## 2020-04-05 LAB — MAGNESIUM: Magnesium: 2.3 mg/dL (ref 1.7–2.4)

## 2020-04-05 LAB — LACTIC ACID, PLASMA: Lactic Acid, Venous: 3.2 mmol/L (ref 0.5–1.9)

## 2020-04-05 LAB — PATHOLOGIST SMEAR REVIEW

## 2020-04-05 LAB — PHOSPHORUS: Phosphorus: 3 mg/dL (ref 2.5–4.6)

## 2020-04-05 LAB — MRSA PCR SCREENING: MRSA by PCR: NEGATIVE

## 2020-04-05 LAB — BETA-HYDROXYBUTYRIC ACID: Beta-Hydroxybutyric Acid: 3.28 mmol/L — ABNORMAL HIGH (ref 0.05–0.27)

## 2020-04-05 MED ORDER — METHYLPREDNISOLONE SODIUM SUCC 40 MG IJ SOLR
40.0000 mg | Freq: Two times a day (BID) | INTRAMUSCULAR | Status: DC
  Administered 2020-04-05 – 2020-04-13 (×16): 40 mg via INTRAVENOUS
  Filled 2020-04-05 (×16): qty 1

## 2020-04-05 MED ORDER — STERILE WATER FOR INJECTION IV SOLN
INTRAVENOUS | Status: DC
  Filled 2020-04-05: qty 850
  Filled 2020-04-05: qty 150
  Filled 2020-04-05 (×2): qty 850
  Filled 2020-04-05: qty 150
  Filled 2020-04-05: qty 850
  Filled 2020-04-05 (×2): qty 150
  Filled 2020-04-05 (×2): qty 850
  Filled 2020-04-05: qty 150
  Filled 2020-04-05: qty 850

## 2020-04-05 MED ORDER — INSULIN DETEMIR 100 UNIT/ML ~~LOC~~ SOLN
10.0000 [IU] | Freq: Every day | SUBCUTANEOUS | Status: DC
  Administered 2020-04-05: 10 [IU] via SUBCUTANEOUS
  Filled 2020-04-05 (×2): qty 0.1

## 2020-04-05 MED ORDER — INSULIN DETEMIR 100 UNIT/ML ~~LOC~~ SOLN
10.0000 [IU] | Freq: Two times a day (BID) | SUBCUTANEOUS | Status: DC
  Administered 2020-04-05 – 2020-04-06 (×2): 10 [IU] via SUBCUTANEOUS
  Filled 2020-04-05 (×3): qty 0.1

## 2020-04-05 MED ORDER — CHLORHEXIDINE GLUCONATE CLOTH 2 % EX PADS
6.0000 | MEDICATED_PAD | Freq: Every day | CUTANEOUS | Status: DC
  Administered 2020-04-05 – 2020-04-10 (×4): 6 via TOPICAL

## 2020-04-05 MED ORDER — ORAL CARE MOUTH RINSE
15.0000 mL | Freq: Two times a day (BID) | OROMUCOSAL | Status: DC
  Administered 2020-04-05 – 2020-04-24 (×28): 15 mL via OROMUCOSAL

## 2020-04-05 NOTE — Plan of Care (Signed)
Pt somewhat less SOB this afternoon, tolerated HFNC all shift, RT attempting to transition to Bubble Flow at this time.  Pt with urinal at bedside, attempt to use condom cath not successful.  Pt w/ poor appetite, but drinking adequate fluids, UOP adequate.

## 2020-04-05 NOTE — Progress Notes (Signed)
PROGRESS NOTE    Stephen Paul  NWG:[id number] DOB: Sep 06, 1931 DOA: 04/03/2020 PCP: Barbette Reichmann, MD   Brief Narrative: Taken from H&P Stephen Paul is a 86 y.o. male with medical history significant of diabetes and hypertension who apparently was told about a week ago that he was positive for COVID-19.  Came to ED with worsening shortness of breath.  Found to be hypoxic in 70s.  Chest x-ray consistent with COVID-19 pneumonia.  CTA negative. Patient is unvaccinated.  Subjective: Patient appears lethargic.  Denies any new complaints.  Assessment & Plan:   Principal Problem:   Acute respiratory failure due to COVID-19 [facility]) Active Problems:   COPD with acute exacerbation (HCC)   Essential hypertension   Diabetes (HCC)  Acute hypoxic respiratory failure secondary to COVID-19 pneumonia/sepsis secondary to COVID-19 infection. Patient did meet sepsis criteria on presentation with being tachycardic, tachypneic, endorgan damage with lactic acidosis, AKI, elevated T bili and COVID-19 infection.  Patient did not receive any fluid on admission.  No cultures obtained.  Procalcitonin elevated at 0.51.  Elevated inflammatory markers.  UA was positive for ketones but not very impressive for any infection.  Blood cultures negative. Saturation improved with heated HFNC.  D-dimer continue to get worse, some improvement in CRP. -Continue remdesivir and Solu-Medrol- day 3 -Continue baricitinib-patient has mildly elevated procalcitonin, he was started on antibiotics yesterday, we will continue at this time. -Continue BiPAP at this time-try heated high flow nasal cannula and see if he maintain saturation. -Continue supportive care. -Continue supplements. -Continue to monitor inflammatory markers. -Continue ceftriaxone. -Continue Zithromax -Monitor lactic acid till it normalized. -Palliative care consult to discuss the goals of care. -CODE STATUS changed to DNR.  Non anion gap metabolic  acidosis/lactic acidosis.  Bicarb at 17 with some improvement.  -Switch IV fluid with bicarb infusion. -Monitor lactic acid. -Continue to monitor  Type 2 diabetes mellitus.  Patient to have ketones in his urine most likely secondary to poor p.o. intake.  No anion gap.  A1c of 7.1.  CBG elevated as patient is on steroid.  Patient was on Metformin and glipizide at home. -Increase Levemir to twice daily dose. -Continue moderate scale sliding scale. -Continue to monitor.  Elevated troponin.  Mildly elevated troponin with a flat curve at 19. Most likely secondary to demand as there is no chest pain.  Hypertension.  Blood pressure mildly elevated.  Patient was not on any antihypertensives at home. -Start him on amlodipine 5 mg daily. -Continue as needed hydralazine.  AKI.  Some improvement in creatinine, it was at 1.31 this morning with baseline around 1. -Continue gentle IV hydration -Continue to monitor -Avoid nephrotoxins.  History of DVT.  Was on Eliquis at home due to history of DVT in February 2021.  CTA negative for acute PE. -Continue home dose of Eliquis.  Objective: Vitals:   04/05/20 0900 04/05/20 1000 04/05/20 1100 04/05/20 1200  BP: (!) 149/70 (!) 161/93 (!) 162/93 (!) 145/94  Pulse: (!) 57 70 72 73  Resp: (!) (!) 22  Temp:      TempSrc:      SpO2: (!) 82% 90% 92% 94%  Weight:      Height:        Intake/Output Summary (Last 24 hours) at 04/05/2020 1309 Last data filed at 04/05/2020 1200 Gross per 24 hour  Intake 3902.19 ml  Output 925 ml  Net 2977.19 ml   Filed Weights   04/03/20 1526  Weight: 68 kg  Examination:  General.  Lethargic appearing elderly man, in no acute distress. Pulmonary.  Lungs clear bilaterally, normal respiratory effort. CV.  Regular rate and rhythm, no JVD, rub or murmur. Abdomen.  Soft, nontender, nondistended, BS positive. CNS.  Alert and oriented x3.  No focal neurologic deficit. Extremities.  No edema, no cyanosis,  pulses intact and symmetrical. Psychiatry.  Judgment and insight appears impaired.  DVT prophylaxis: Eliquis Code Status: Full Family Communication: Wife was updated at phone. Disposition Plan:  Status is: Inpatient  Remains inpatient appropriate because:Inpatient level of care appropriate due to severity of illness   Dispo: The patient is from: Home              Anticipated d/c is to: Home              Anticipated d/c date is: 3 days              Patient currently is not medically stable to d/c.  Patient is very high risk for deterioration and death.   Palliative care was consulted and CODE STATUS was changed to DNR after discussing with wife.  Consultants:   Pulmonary  Procedures:  Antimicrobials:  Ceftriaxone Zithromax  Data Reviewed: I have personally reviewed following labs and imaging studies  CBC: Recent Labs  Lab 04/03/20 1530 04/04/20 0436 04/05/20 0329  WBC 5.1 2.3* 7.5  NEUTROABS 3.9 1.7 6.2  HGB 12.7* 11.3* 11.6*  HCT 38.6* 34.7* 34.3*  MCV 64.5* 66.0* 63.4*  PLT 305 282 362   Basic Metabolic Panel: Recent Labs  Lab 04/03/20 1530 04/04/20 0436 04/05/20 0329  NA 139 139 139  K 4.4 4.9 5.3*  CL 109 110 110  CO2 15* 15* 17*  GLUCOSE 187* 255* 270*  BUN 15 21 31*  CREATININE 1.37* 1.49* 1.31*  CALCIUM 8.2* 7.8* 7.8*  MG  --  2.2 2.3  PHOS  --  3.3 3.0   GFR: Estimated Creatinine Clearance: 36.5 mL/min (A) (by C-G formula based on SCr of 1.31 mg/dL (H)). Liver Function Tests: Recent Labs  Lab 04/03/20 1530 04/04/20 0436 04/05/20 0329  AST 102* 63* 75*  ALT 84* 63* 56*  ALKPHOS 67 61 61  BILITOT 1.0 1.4* 1.1  PROT 7.8 7.1 7.4  ALBUMIN 2.8* 2.5* 2.4*   No results for input(s): LIPASE, AMYLASE in the last 168 hours. No results for input(s): AMMONIA in the last 168 hours. Coagulation Profile: No results for input(s): INR, PROTIME in the last 168 hours. Cardiac Enzymes: No results for input(s): CKTOTAL, CKMB, CKMBINDEX, TROPONINI in the  last 168 hours. BNP (last 3 results) No results for input(s): PROBNP in the last 8760 hours. HbA1C: Recent Labs    04/03/20 2215  HGBA1C 7.1*   CBG: Recent Labs  Lab 04/04/20 1652 04/04/20 2149 04/05/20 0031 04/05/20 0756 04/05/20 1110  GLUCAP 222* 227* 240* 282* 298*   Lipid Profile: No results for input(s): CHOL, HDL, LDLCALC, TRIG, CHOLHDL, LDLDIRECT in the last 72 hours. Thyroid Function Tests: No results for input(s): TSH, T4TOTAL, FREET4, T3FREE, THYROIDAB in the last 72 hours. Anemia Panel: Recent Labs    04/04/20 0436 04/05/20 0329  FERRITIN 226 239   Sepsis Labs: Recent Labs  Lab 04/03/20 1832 04/03/20 2215 04/04/20 0856 04/04/20 1120 04/05/20 0354  PROCALCITON  --  0.51  --   --   --   LATICACIDVEN 3.3*  --  3.0* 3.1* 3.2*    Recent Results (from the past 240 hour(s))  SARS Coronavirus 2 by  RT PCR (hospital order, performed in [facility] hospital lab) Nasopharyngeal Nasopharyngeal Swab     Status: Abnormal   Collection Time: 04/03/20  3:30 PM   Specimen: Nasopharyngeal Swab  Result Value Ref Range Status   SARS Coronavirus 2 POSITIVE (A) NEGATIVE Final    Comment: RESULT CALLED TO, READ BACK BY AND VERIFIED WITH: SAMANTHA HAMILTON 04/03/20 AT 1748 BY ACR (NOTE) SARS-CoV-2 target nucleic acids are DETECTED  SARS-CoV-2 RNA is generally detectable in upper respiratory specimens  during the acute phase of infection.  Positive results are indicative  of the presence of the identified virus, but do not rule out bacterial infection or co-infection with other pathogens not detected by the test.  Clinical correlation with patient history and  other diagnostic information is necessary to determine patient infection status.  The expected result is negative.  Fact Sheet for Patients:   BoilerBrush.com.cy   Fact Sheet for Healthcare Providers:   https://pope.com/    This test is not yet approved or cleared  by the [place] FDA and  has been authorized for detection and/or diagnosis of SARS-CoV-2 by FDA under an Emergency Use Authorization (EUA).  This EUA will remain in effect (meaning th is test can be used) for the duration of  the COVID-19 declaration under Section 564(b)(1) of the Act, 21 U.S.C. section 360-bbb-3(b)(1), unless the authorization is terminated or revoked sooner.  Performed at [facility], [address] [place], [place] [address]   CULTURE, BLOOD (ROUTINE X 2) w Reflex to ID Panel     Status: None (Preliminary result)   Collection Time: 04/04/20 11:19 AM   Specimen: BLOOD  Result Value Ref Range Status   Specimen Description BLOOD L ARM  Final   Special Requests   Final    BOTTLES DRAWN AEROBIC AND ANAEROBIC Blood Culture adequate volume   Culture   Final    NO GROWTH < 24 HOURS Performed at [facility], [address] [place], [place] [address]    Report Status PENDING  Incomplete  CULTURE, BLOOD (ROUTINE X 2) w Reflex to ID Panel     Status: None (Preliminary result)   Collection Time: 04/04/20 11:22 AM   Specimen: BLOOD  Result Value Ref Range Status   Specimen Description BLOOD L HAND  Final   Special Requests   Final    BOTTLES DRAWN AEROBIC AND ANAEROBIC Blood Culture adequate volume   Culture   Final    NO GROWTH < 24 HOURS Performed at [facility], [address], [place], [place] [address]    Report Status PENDING  Incomplete     Radiology Studies: CT Angio Chest PE W and/or Wo Contrast  Result Date: 04/03/2020 CLINICAL DATA:  COVID-19 positive, shortness of breath, weakness, hypoxia, abdominal pain EXAM: CT ANGIOGRAPHY CHEST CT ABDOMEN AND PELVIS WITH CONTRAST TECHNIQUE: Multidetector CT imaging of the chest was performed using the standard protocol during bolus administration of intravenous contrast. Multiplanar CT image reconstructions and MIPs were obtained to evaluate the vascular anatomy. Multidetector CT  imaging of the abdomen and pelvis was performed using the standard protocol during bolus administration of intravenous contrast. CONTRAST:  50mL OMNIPAQUE IOHEXOL 350 MG/ML SOLN COMPARISON:  04/03/2020 FINDINGS: CTA CHEST FINDINGS Cardiovascular: This is a technically adequate evaluation of the pulmonary vasculature. No filling defects or pulmonary emboli. The heart is unremarkable without pericardial effusion. Normal caliber of the thoracic aorta. Mediastinum/Nodes: Mildly enlarged subcarinal lymph node measures 13 mm in short axis. Trachea,  esophagus, and thyroid are unremarkable. Lungs/Pleura: There is severe upper lobe predominant emphysema. No acute airspace disease, effusion, or pneumothorax. Bibasilar areas of scarring are noted, right greater than left. The central airways are patent. Musculoskeletal: No acute or destructive bony lesions. Reconstructed images demonstrate no additional findings. Review of the MIP images confirms the above findings. CT ABDOMEN and PELVIS FINDINGS Hepatobiliary: 4.2 cm cyst within the caudate lobe of the liver. Otherwise liver is unremarkable. No biliary dilation. The gallbladder is normal. Pancreas: Unremarkable. No pancreatic ductal dilatation or surrounding inflammatory changes. Spleen: Normal in size without focal abnormality. Adrenals/Urinary Tract: Adrenal glands are unremarkable. Kidneys are normal, without renal calculi, focal lesion, or hydronephrosis. Bladder is unremarkable. Stomach/Bowel: No bowel obstruction or ileus. Normal appendix right lower quadrant. There is diffuse diverticulosis of the descending and sigmoid colon without diverticulitis. Vascular/Lymphatic: Aortic atherosclerosis. No enlarged abdominal or pelvic lymph nodes. Reproductive: Prostate is unremarkable. Other: No free fluid or free gas.  No abdominal wall hernia. Musculoskeletal: No acute or destructive bony lesions. Reconstructed images demonstrate no additional findings. Review of the MIP  images confirms the above findings. IMPRESSION: 1. No evidence of pulmonary embolus. 2. Borderline enlarged subcarinal lymph node, likely reactive. 3. Diverticulosis without diverticulitis. 4. Aortic Atherosclerosis (ICD10-I70.0) and Emphysema (ICD10-J43.9). Electronically Signed   By: Sharlet Salina M.D.   On: 04/03/2020 19:14   CT ABDOMEN PELVIS W CONTRAST  Result Date: 04/03/2020 CLINICAL DATA:  COVID-19 positive, shortness of breath, weakness, hypoxia, abdominal pain EXAM: CT ANGIOGRAPHY CHEST CT ABDOMEN AND PELVIS WITH CONTRAST TECHNIQUE: Multidetector CT imaging of the chest was performed using the standard protocol during bolus administration of intravenous contrast. Multiplanar CT image reconstructions and MIPs were obtained to evaluate the vascular anatomy. Multidetector CT imaging of the abdomen and pelvis was performed using the standard protocol during bolus administration of intravenous contrast. CONTRAST:  10mL OMNIPAQUE IOHEXOL 350 MG/ML SOLN COMPARISON:  04/03/2020 FINDINGS: CTA CHEST FINDINGS Cardiovascular: This is a technically adequate evaluation of the pulmonary vasculature. No filling defects or pulmonary emboli. The heart is unremarkable without pericardial effusion. Normal caliber of the thoracic aorta. Mediastinum/Nodes: Mildly enlarged subcarinal lymph node measures 13 mm in short axis. Trachea, esophagus, and thyroid are unremarkable. Lungs/Pleura: There is severe upper lobe predominant emphysema. No acute airspace disease, effusion, or pneumothorax. Bibasilar areas of scarring are noted, right greater than left. The central airways are patent. Musculoskeletal: No acute or destructive bony lesions. Reconstructed images demonstrate no additional findings. Review of the MIP images confirms the above findings. CT ABDOMEN and PELVIS FINDINGS Hepatobiliary: 4.2 cm cyst within the caudate lobe of the liver. Otherwise liver is unremarkable. No biliary dilation. The gallbladder is normal.  Pancreas: Unremarkable. No pancreatic ductal dilatation or surrounding inflammatory changes. Spleen: Normal in size without focal abnormality. Adrenals/Urinary Tract: Adrenal glands are unremarkable. Kidneys are normal, without renal calculi, focal lesion, or hydronephrosis. Bladder is unremarkable. Stomach/Bowel: No bowel obstruction or ileus. Normal appendix right lower quadrant. There is diffuse diverticulosis of the descending and sigmoid colon without diverticulitis. Vascular/Lymphatic: Aortic atherosclerosis. No enlarged abdominal or pelvic lymph nodes. Reproductive: Prostate is unremarkable. Other: No free fluid or free gas.  No abdominal wall hernia. Musculoskeletal: No acute or destructive bony lesions. Reconstructed images demonstrate no additional findings. Review of the MIP images confirms the above findings. IMPRESSION: 1. No evidence of pulmonary embolus. 2. Borderline enlarged subcarinal lymph node, likely reactive. 3. Diverticulosis without diverticulitis. 4. Aortic Atherosclerosis (ICD10-I70.0) and Emphysema (ICD10-J43.9). Electronically Signed   By: Casimiro Needle  Manson PasseyBrown M.D.   On: 04/03/2020 19:14   DG Chest Portable 1 View  Result Date: 04/03/2020 CLINICAL DATA:  Respiratory distress, weakness, short of breath EXAM: PORTABLE CHEST 1 VIEW COMPARISON:  None. FINDINGS: Single frontal view of the chest demonstrates an unremarkable cardiac silhouette. There is diffuse increased interstitial prominence throughout the lungs, without focal consolidation, effusion, or pneumothorax. There are no acute bony abnormalities. IMPRESSION: 1. Diffuse increased interstitial prominence, without focal lung consolidation. Findings are of uncertain acuity, but could reflect interstitial edema, atypical infection, or scarring. Electronically Signed   By: Sharlet SalinaMichael  Brown M.D.   On: 04/03/2020 15:51    Scheduled Meds: . apixaban  5 mg Oral BID  . baricitinib  2 mg Oral Daily  . Chlorhexidine Gluconate Cloth  6 each  Topical Daily  . folic acid  1 mg Oral Daily  . insulin aspart  0-15 Units Subcutaneous TID WC  . insulin aspart  0-5 Units Subcutaneous QHS  . insulin detemir  10 Units Subcutaneous Daily  . Ipratropium-Albuterol  1 puff Inhalation Q6H  . linagliptin  5 mg Oral Daily  . mouth rinse  15 mL Mouth Rinse BID  . methylPREDNISolone (SOLU-MEDROL) injection  40 mg Intravenous Q12H  . multivitamin with minerals  1 tablet Oral Daily  . thiamine  100 mg Oral Daily   Continuous Infusions: . azithromycin Stopped (04/04/20 2329)  . cefTRIAXone (ROCEPHIN)  IV 1 g (04/05/20 1150)  . remdesivir 100 mg in NS 100 mL 100 mg (04/05/20 0943)  .  sodium bicarbonate (isotonic) infusion in sterile water 100 mL/hr at 04/05/20 1149     LOS: 2 days   Time spent: 35 minutes  Arnetha CourserSumayya Linnet Bottari, MD [facility]  If 7PM-7AM, please contact night-coverage [email or web address]  04/05/2020, 1:09 PM   This record has been created using Conservation officer, historic buildingsDragon voice recognition software. Errors have been sought and corrected,but may not always be located. Such creation errors do not reflect on the standard of care.

## 2020-04-05 NOTE — Consult Note (Signed)
Consultation Note Date: 04/05/2020   Patient Name: Stephen Paul  DOB: 07-Jul-1932  MRN: [id number]  Age / Sex: 86 y.o., male  PCP: Tracie Harrier, MD Referring Physician: Lorella Nimrod, MD  Reason for Consultation: Establishing goals of care  HPI/Patient Profile: 86 y.o. male  with past medical history of DM and HTN admitted on 04/03/2020 with COVID-19 and acute respiratory failure. Requiring HFNC.  High risk for intubation. PMT consulted to discuss [place].   Clinical Assessment and Goals of Care: I have reviewed medical records including EPIC notes, labs and imaging, received report from RN, assessed the patient and then met with patient and wife  to discuss diagnosis prognosis, GOC, EOL wishes, disposition and options.  I introduced Palliative Medicine as specialized medical care for people living with serious illness. It focuses on providing relief from the symptoms and stress of a serious illness. The goal is to improve quality of life for both the patient and the family.  Spoke to wife via telephone following conversation with patient. Both tell that prior to illness patient was doing well. 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 No problems with appetite or cognition. Hard of hearing.   We discussed patient's current illness and what it means in the larger context of patient's on-going co-morbidities.  Natural disease trajectory and expectations at EOL were discussed. Discussed concern about risk for intubation. Discussed continuing current measures but if decompensates, transition to comfort measures. Encouraged patient and wife to consider DNR/DNI status understanding poor outcomes in similar hospitalized patients - they both agree to DNR/DNI status.   Discussed with patient and wife the importance of continued conversation with family and the medical providers regarding overall plan of care and treatment options, ensuring decisions are within  the context of the patient's values and GOCs.    Questions and concerns were addressed. The family was encouraged to call with questions or concerns.   Primary Decision Maker PATIENT - joined by wife  SUMMARY OF RECOMMENDATIONS   - patient and wife agree to DNR/DNI - continue current measures - if decompensates transition to comfort measures  Code Status/Advance Care Planning:  DNR   Prognosis:   Unable to determine  Discharge Planning: To Be Determined      Primary Diagnoses: Present on Admission: . Acute respiratory failure due to COVID-19 ([place]) . COPD with acute exacerbation ([place]) . Essential hypertension   I have reviewed the medical record, interviewed the patient and family, and examined the patient. The following aspects are pertinent.  Past Medical History:  Diagnosis Date  . Diabetes mellitus without complication ([place])   . Hypertension    Social History   Socioeconomic History  . Marital status: Married    Spouse name: Not on file  . Number of children: Not on file  . Years of education: Not on file  . Highest education level: Not on file  Occupational History  . Not on file  Tobacco Use  . Smoking status: Never Smoker  . Smokeless tobacco: Never Used  Substance and Sexual Activity  . Alcohol use: Never  . Drug use: Never  . Sexual activity: Not on file  Other Topics Concern  . Not on file  Social History Narrative  . Not on file   Social Determinants of Health   Financial Resource Strain:   . Difficulty of Paying Living Expenses: Not on file  Food Insecurity:   . Worried About Charity fundraiser in the Last Year: Not on file  .  Ran Out of Food in the Last Year: Not on file  Transportation Needs:   . Lack of Transportation (Medical): Not on file  . Lack of Transportation (Non-Medical): Not on file  Physical Activity:   . Days of Exercise per Week: Not on file  . Minutes of Exercise per Session: Not on file  Stress:   . Feeling of  Stress : Not on file  Social Connections:   . Frequency of Communication with Friends and Family: Not on file  . Frequency of Social Gatherings with Friends and Family: Not on file  . Attends Religious Services: Not on file  . Active Member of Clubs or Organizations: Not on file  . Attends Archivist Meetings: Not on file  . Marital Status: Not on file   No family history on file. Scheduled Meds: . apixaban  5 mg Oral BID  . baricitinib  2 mg Oral Daily  . Chlorhexidine Gluconate Cloth  6 each Topical Daily  . folic acid  1 mg Oral Daily  . insulin aspart  0-15 Units Subcutaneous TID WC  . insulin aspart  0-5 Units Subcutaneous QHS  . insulin detemir  10 Units Subcutaneous Daily  . Ipratropium-Albuterol  1 puff Inhalation Q6H  . linagliptin  5 mg Oral Daily  . methylPREDNISolone (SOLU-MEDROL) injection  40 mg Intravenous Q12H  . multivitamin with minerals  1 tablet Oral Daily  . thiamine  100 mg Oral Daily   Continuous Infusions: . azithromycin Stopped (04/04/20 2329)  . cefTRIAXone (ROCEPHIN)  IV Stopped (04/04/20 1651)  . remdesivir 100 mg in NS 100 mL 100 mg (04/05/20 0943)  .  sodium bicarbonate (isotonic) infusion in sterile water     PRN Meds:.acetaminophen, chlorpheniramine-HYDROcodone, guaiFENesin-dextromethorphan, hydrALAZINE Allergies  Allergen Reactions  . Mycophenolate Mofetil Rash   Review of Systems  Unable to perform ROS: Other    Physical Exam Constitutional:      Comments: Wakes to voice, falls asleep quickly  Pulmonary:     Comments: On HFNC Skin:    General: Skin is warm and dry.  Neurological:     Mental Status: He is oriented to person, place, and time.     Vital Signs: BP (!) 161/93   Pulse 70   Temp 97.6 F (36.4 C) (Oral)   Resp 16   Ht _0  (1.676 m)   Wt 68 kg   SpO2 90%   BMI 24.21 kg/m  Pain Scale: 0-10   Pain Score: 0-No pain   SpO2: SpO2: 90 % O2 Device:SpO2: 90 % O2 Flow Rate: .O2 Flow Rate (L/min): 45  L/min  IO: Intake/output summary:   Intake/Output Summary (Last 24 hours) at 04/05/2020 1115 Last data filed at 04/05/2020 1000 Gross per 24 hour  Intake 3738.28 ml  Output 925 ml  Net 2813.28 ml    LBM: Last BM Date: 04/04/20 Baseline Weight: Weight: 68 kg Most recent weight: Weight: 68 kg     Palliative Assessment/Data: PPS 30%    Time Total: 60 minutes Greater than 50%  of this time was spent counseling and coordinating care related to the above assessment and plan.  Juel Burrow, DNP, AGNP-C Palliative Medicine Team [phone] Pager: [phone]

## 2020-04-05 NOTE — Progress Notes (Signed)
Inpatient Diabetes Program Recommendations  AACE/ADA: New Consensus Statement on Inpatient Glycemic Control (2015)  Target Ranges:  Prepandial:   less than 140 mg/dL      Peak postprandial:   less than 180 mg/dL (1-2 hours)      Critically ill patients:  140 - 180 mg/dL   Lab Results  Component Value Date   GLUCAP 282 (H) 04/05/2020   HGBA1C 7.1 (H) 04/03/2020    Review of Glycemic Control Results for JAMERIUS, BOECKMAN (MRN [id number]) as of 04/05/2020 08:57  Ref. Range 04/04/2020 16:52 04/04/2020 21:49 04/05/2020 00:31 04/05/2020 07:56  Glucose-Capillary Latest Ref Range: 70 - 99 mg/dL 254 (H) 270 (H) 623 (H) 282 (H)   Diabetes history: Type 2 DM Outpatient Diabetes medications: Glipizide 10 mg QD, Metformin 1000 mg BID Current orders for Inpatient glycemic control: Novolog 0-15 units TID, Novolog 0-5 units QHS, Levemir 10 units QHS, Tradjenta 5 mg QD Decadron 6 mg x 1 Solumedrol 60 mg BID  Inpatient Diabetes Program Recommendations:    Consider changing Levemir to 10 units QD.  Patient missed QHS dose of Levemir last night. Thanks, Lujean Rave, MSN, RNC-OB Diabetes Coordinator [phone] (8a-5p)

## 2020-04-05 NOTE — Progress Notes (Signed)
Name: Stephen Paul MRN: [id number] DOB: 1932/01/25     CONSULTATION DATE:  Cindi Carbon MD : Nelson Chimes  FOLLOW UP: resp failure due to COVID-19    PATIENT BACKGROUND: 86 y.o. male with medical history significant of diabetes and hypertension diagnosed with COVID-19 a week PTA, admitted to progressive cardiac/inpatient with high FiO2 requirements, patient currently on high flow O2.  Allergies  Allergen Reactions  . Mycophenolate Mofetil Rash   Scheduled Meds: . apixaban  5 mg Oral BID  . Chlorhexidine Gluconate Cloth  6 each Topical Daily  . folic acid  1 mg Oral Daily  . insulin aspart  0-15 Units Subcutaneous TID WC  . insulin aspart  0-5 Units Subcutaneous QHS  . insulin detemir  15 Units Subcutaneous BID  . Ipratropium-Albuterol  1 puff Inhalation Q6H  . linagliptin  5 mg Oral Daily  . mouth rinse  15 mL Mouth Rinse BID  . methylPREDNISolone (SOLU-MEDROL) injection  40 mg Intravenous Q12H  . multivitamin with minerals  1 tablet Oral Daily  . thiamine  100 mg Oral Daily   Continuous Infusions: . azithromycin Stopped (04/06/20 0000)  . cefTRIAXone (ROCEPHIN)  IV Stopped (04/06/20 1003)  . remdesivir 100 mg in NS 100 mL Stopped (04/06/20 1023)  .  sodium bicarbonate (isotonic) infusion in sterile water 100 mL/hr at 04/06/20 1800   PRN Meds:.acetaminophen, chlorpheniramine-HYDROcodone, guaiFENesin-dextromethorphan, hydrALAZINE   Review of Systems: Limited due to respiratory distress in the setting of COVID-19, patient hard to understand with negative pressure system in the room and PPE.   VITAL SIGNS: Temp:  [97.6 F (36.4 C)] 97.6 F (36.4 C) (09/10 0018) Pulse Rate:  [44-102] 68 (09/10 1500) Resp:  [15-30] 18 (09/10 1500) BP: (123-176)/(64-105) 140/72 (09/10 1500) SpO2:  [82 %-100 %] 96 % (09/10 1500) FiO2 (%):  [60 %-70 %] 63 % (09/10 1200)     SpO2: 96 % O2 Flow Rate (L/min): 45 L/min FiO2 (%): 63 %   COVID-19 DISASTER DECLARATION:   FULL CONTACT  PHYSICAL EXAMINATION WAS NOT POSSIBLE DUE TO TREATMENT OF COVID-19 AND   CONSERVATION OF PERSONAL PROTECTIVE EQUIPMENT, LIMITED EXAM FINDINGS INCLUDE:   PHYSICAL EXAMINATION:  GENERAL:critically ill appearing, mild tachypnea, on high flow O2 PULMONARY: limited exam due to PPE, coarse breath sounds no other adventitious sounds CARDIOVASCULAR: Monitor shows sinus rhythm rate 68 GASTROINTESTINAL: Soft, nontender, non-distended. Positive bowel sounds.  MUSCULOSKELETAL: No swelling, clubbing, or edema.  NEUROLOGIC: awake, interactive but difficult to understand due to issues above  SKIN:intact,warm,dry    CULTURE RESULTS   Recent Results (from the past 240 hour(s))  SARS Coronavirus 2 by RT PCR (hospital order, performed in [facility] hospital lab) Nasopharyngeal Nasopharyngeal Swab     Status: Abnormal   Collection Time: 04/03/20  3:30 PM   Specimen: Nasopharyngeal Swab  Result Value Ref Range Status   SARS Coronavirus 2 POSITIVE (A) NEGATIVE Final    Comment: RESULT CALLED TO, READ BACK BY AND VERIFIED WITH: SAMANTHA HAMILTON 04/03/20 AT 1748 BY ACR (NOTE) SARS-CoV-2 target nucleic acids are DETECTED  SARS-CoV-2 RNA is generally detectable in upper respiratory specimens  during the acute phase of infection.  Positive results are indicative  of the presence of the identified virus, but do not rule out bacterial infection or co-infection with other pathogens not detected by the test.  Clinical correlation with patient history and  other diagnostic information is necessary to determine patient infection status.  The expected result is negative.  Fact Sheet for Patients:  BoilerBrush.com.cy   Fact Sheet for Healthcare Providers:   https://pope.com/    This test is not yet approved or cleared by the [place] FDA and  has been authorized for detection and/or diagnosis of SARS-CoV-2 by FDA under an Emergency Use Authorization  (EUA).  This EUA will remain in effect (meaning th is test can be used) for the duration of  the COVID-19 declaration under Section 564(b)(1) of the Act, 21 U.S.C. section 360-bbb-3(b)(1), unless the authorization is terminated or revoked sooner.  Performed at [facility], [address] [place], [place] [address]   CULTURE, BLOOD (ROUTINE X 2) w Reflex to ID Panel     Status: None (Preliminary result)   Collection Time: 04/04/20 11:19 AM   Specimen: BLOOD  Result Value Ref Range Status   Specimen Description BLOOD L ARM  Final   Special Requests   Final    BOTTLES DRAWN AEROBIC AND ANAEROBIC Blood Culture adequate volume   Culture   Final    NO GROWTH < 24 HOURS Performed at [facility], [address], [place], [place] [address]    Report Status PENDING  Incomplete  CULTURE, BLOOD (ROUTINE X 2) w Reflex to ID Panel     Status: None (Preliminary result)   Collection Time: 04/04/20 11:22 AM   Specimen: BLOOD  Result Value Ref Range Status   Specimen Description BLOOD L HAND  Final   Special Requests   Final    BOTTLES DRAWN AEROBIC AND ANAEROBIC Blood Culture adequate volume   Culture   Final    NO GROWTH < 24 HOURS Performed at [facility], [address], [place], [place] [address]    Report Status PENDING  Incomplete  MRSA PCR Screening     Status: None   Collection Time: 04/05/20 11:57 AM   Specimen: Nasal Mucosa; Nasopharyngeal  Result Value Ref Range Status   MRSA by PCR NEGATIVE NEGATIVE Final    Comment:        The GeneXpert MRSA Assay (FDA approved for NASAL specimens only), is one component of a comprehensive MRSA colonization surveillance program. It is not intended to diagnose MRSA infection nor to guide or monitor treatment for MRSA infections. Performed at [facility], [address], [place], [place] [address]           IMAGING    No results found. CBC    Component Value Date/Time   WBC 7.5  04/05/2020 0329   RBC 5.41 04/05/2020 0329   HGB 11.6 (L) 04/05/2020 0329   HCT 34.3 (L) 04/05/2020 0329   PLT 362 04/05/2020 0329   MCV 63.4 (L) 04/05/2020 0329   MCH 21.4 (L) 04/05/2020 0329   MCHC 33.8 04/05/2020 0329   RDW 21.7 (H) 04/05/2020 0329   LYMPHSABS 0.8 04/05/2020 0329   MONOABS 0.6 04/05/2020 0329   EOSABS 0.0 04/05/2020 0329   BASOSABS 0.0 04/05/2020 0329   BMP Latest Ref Rng & Units 04/05/2020 04/04/2020 04/03/2020  Glucose 70 - 99 mg/dL 992(E) 268(T) 419(Q)  BUN 8 - 23 mg/dL 22(W) 21 15  Creatinine 0.61 - 1.24 mg/dL 9.79(G) 9.21(J) 9.41(D)  Sodium 135 - 145 mmol/L 139 139 139  Potassium 3.5 - 5.1 mmol/L 5.3(H) 4.9 4.4  Chloride 98 - 111 mmol/L 110 110 109  CO2 22 - 32 mmol/L 17(L) 15(L) 15(L)  Calcium 8.9 - 10.3 mg/dL 7.8(L) 7.8(L) 8.2(L)    ASSESSMENT AND PLAN SYNOPSIS  COVID-19 infection, ARDS and pneumonia/pneumonitis with acute  renal failure, patient with multiorgan failure Continue IV steroids  IV remdisivir  Aggressive pulm toilet recommended Pulmonary hygiene proning as tolerated due to severe hypoxia   Maintain airborne and contact precautions  As needed bronchodilators (MDI) Vitamin C and zinc Antitussives Patient is DNR  Prognosis is exceedingly guarded due to advanced age and severity of COVID-19 infection. Continue supportive care  C. Danice Goltz, MD [facility]   *This note was dictated using voice recognition software/Dragon.  Despite best efforts to proofread, errors can occur which can change the meaning.  Any change was purely unintentional.

## 2020-04-06 DIAGNOSIS — U071 COVID-19: Secondary | ICD-10-CM

## 2020-04-06 LAB — CBC WITH DIFFERENTIAL/PLATELET
Abs Immature Granulocytes: 0.09 10*3/uL — ABNORMAL HIGH (ref 0.00–0.07)
Basophils Absolute: 0 10*3/uL (ref 0.0–0.1)
Basophils Relative: 0 %
Eosinophils Absolute: 0 10*3/uL (ref 0.0–0.5)
Eosinophils Relative: 0 %
HCT: 31.9 % — ABNORMAL LOW (ref 39.0–52.0)
Hemoglobin: 10.6 g/dL — ABNORMAL LOW (ref 13.0–17.0)
Immature Granulocytes: 1 %
Lymphocytes Relative: 5 %
Lymphs Abs: 0.7 10*3/uL (ref 0.7–4.0)
MCH: 21.3 pg — ABNORMAL LOW (ref 26.0–34.0)
MCHC: 33.2 g/dL (ref 30.0–36.0)
MCV: 64.1 fL — ABNORMAL LOW (ref 80.0–100.0)
Monocytes Absolute: 1.2 10*3/uL — ABNORMAL HIGH (ref 0.1–1.0)
Monocytes Relative: 8 %
Neutro Abs: 12.3 10*3/uL — ABNORMAL HIGH (ref 1.7–7.7)
Neutrophils Relative %: 86 %
Platelets: 349 10*3/uL (ref 150–400)
RBC: 4.98 MIL/uL (ref 4.22–5.81)
RDW: 21 % — ABNORMAL HIGH (ref 11.5–15.5)
Smear Review: NORMAL
WBC: 14.3 10*3/uL — ABNORMAL HIGH (ref 4.0–10.5)
nRBC: 0.1 % (ref 0.0–0.2)

## 2020-04-06 LAB — COMPREHENSIVE METABOLIC PANEL
ALT: 44 U/L (ref 0–44)
AST: 46 U/L — ABNORMAL HIGH (ref 15–41)
Albumin: 2.2 g/dL — ABNORMAL LOW (ref 3.5–5.0)
Alkaline Phosphatase: 59 U/L (ref 38–126)
Anion gap: 14 (ref 5–15)
BUN: 29 mg/dL — ABNORMAL HIGH (ref 8–23)
CO2: 21 mmol/L — ABNORMAL LOW (ref 22–32)
Calcium: 7.5 mg/dL — ABNORMAL LOW (ref 8.9–10.3)
Chloride: 107 mmol/L (ref 98–111)
Creatinine, Ser: 1.19 mg/dL (ref 0.61–1.24)
GFR calc Af Amer: >60 mL/min (ref >60)
GFR calc non Af Amer: 55 mL/min — ABNORMAL LOW (ref >60)
Glucose, Bld: 208 mg/dL — ABNORMAL HIGH (ref 70–99)
Potassium: 3.4 mmol/L — ABNORMAL LOW (ref 3.5–5.1)
Sodium: 142 mmol/L (ref 135–145)
Total Bilirubin: 0.9 mg/dL (ref 0.3–1.2)
Total Protein: 6.2 g/dL — ABNORMAL LOW (ref 6.5–8.1)

## 2020-04-06 LAB — MAGNESIUM: Magnesium: 2.3 mg/dL (ref 1.7–2.4)

## 2020-04-06 LAB — FIBRIN DERIVATIVES D-DIMER (ARMC ONLY): Fibrin derivatives D-dimer (ARMC): >7500 ng/mL (FEU) — ABNORMAL HIGH (ref 0.00–499.00)

## 2020-04-06 LAB — GLUCOSE, CAPILLARY
Glucose-Capillary: 196 mg/dL — ABNORMAL HIGH (ref 70–99)
Glucose-Capillary: 219 mg/dL — ABNORMAL HIGH (ref 70–99)
Glucose-Capillary: 219 mg/dL — ABNORMAL HIGH (ref 70–99)
Glucose-Capillary: 196 mg/dL — ABNORMAL HIGH (ref 70–99)

## 2020-04-06 LAB — PHOSPHORUS: Phosphorus: 2.8 mg/dL (ref 2.5–4.6)

## 2020-04-06 LAB — C-REACTIVE PROTEIN: CRP: 5.3 mg/dL — ABNORMAL HIGH (ref <1.0)

## 2020-04-06 LAB — PROCALCITONIN: Procalcitonin: 0.18 ng/mL

## 2020-04-06 LAB — CK: Total CK: 225 U/L (ref 49–397)

## 2020-04-06 LAB — LACTIC ACID, PLASMA: Lactic Acid, Venous: 3.3 mmol/L (ref 0.5–1.9)

## 2020-04-06 LAB — FERRITIN: Ferritin: 211 ng/mL (ref 24–336)

## 2020-04-06 MED ORDER — FUROSEMIDE 10 MG/ML IJ SOLN
40.0000 mg | Freq: Once | INTRAMUSCULAR | Status: AC
  Administered 2020-04-06: 40 mg via INTRAVENOUS
  Filled 2020-04-06: qty 4

## 2020-04-06 MED ORDER — POTASSIUM CHLORIDE CRYS ER 20 MEQ PO TBCR
40.0000 meq | EXTENDED_RELEASE_TABLET | Freq: Once | ORAL | Status: AC
  Administered 2020-04-06: 40 meq via ORAL

## 2020-04-06 MED ORDER — INSULIN DETEMIR 100 UNIT/ML ~~LOC~~ SOLN
15.0000 [IU] | Freq: Two times a day (BID) | SUBCUTANEOUS | Status: DC
  Administered 2020-04-06 – 2020-04-09 (×8): 15 [IU] via SUBCUTANEOUS
  Filled 2020-04-06 (×9): qty 0.15

## 2020-04-06 NOTE — Progress Notes (Signed)
PROGRESS NOTE    Stephen Paul  ZOX:[id number] DOB: Jan 01, 1932 DOA: 04/03/2020 PCP: Barbette Reichmann, MD   Brief Narrative: Taken from H&P Stephen Paul is a 86 y.o. male with medical history significant of diabetes and hypertension who apparently was told about a week ago that he was positive for COVID-19.  Came to ED with worsening shortness of breath.  Found to be hypoxic in 70s.  Chest x-ray consistent with COVID-19 pneumonia.  CTA negative. Patient is unvaccinated.  Subjective: Patient is feeling little better when seen today.  Started showing some improvement in his saturation.  He was 100% on 50 L.  Assessment & Plan:   Principal Problem:   Acute respiratory failure due to COVID-19 [facility]) Active Problems:   COPD with acute exacerbation (HCC)   Essential hypertension   Diabetes (HCC)   Goals of care, counseling/discussion   Palliative care by specialist   DNR (do not resuscitate)  Acute hypoxic respiratory failure secondary to COVID-19 pneumonia/sepsis secondary to COVID-19 infection. Patient did meet sepsis criteria on presentation with being tachycardic, tachypneic, endorgan damage with lactic acidosis, AKI, elevated T bili and COVID-19 infection.  Patient did not receive any fluid on admission.  No cultures obtained.  Procalcitonin elevated at 0.51.  Started improving elevated inflammatory markers.  UA was positive for ketones but not very impressive for any infection.  Blood cultures negative. Saturation improved with heated HFNC.  There is a room to wean now. D-dimer continue to remain above 7500 with improvement in CRP. -Continue remdesivir and Solu-Medrol- day 4 -Discontinue baricitinib due to worsening leukocytosis. -Continue supportive care. -Continue supplements. -Continue to monitor inflammatory markers. -Continue ceftriaxone. -Continue Zithromax -Monitor lactic acid till it normalized. -Palliative care consult to discuss the goals of care. -CODE STATUS  changed to DNR. -Can be transferred out of stepdown to progressive care as continue to need heated HFNC.  Non anion gap metabolic acidosis/lactic acidosis.  Bicarb at 21 with some improvement.  Lactic acid remained at 3.4.  CK within normal limit. -Continue bicarb infusion. -Monitor lactic acid. -Continue to monitor  Type 2 diabetes mellitus.  Patient to have ketones in his urine most likely secondary to poor p.o. intake.  No anion gap.  A1c of 7.1.  CBG elevated as patient is on steroid.  Patient was on Metformin and glipizide at home. -Increase Levemir to 15 units twice daily. -Continue moderate scale sliding scale. -Continue to monitor.  Elevated troponin.  Mildly elevated troponin with a flat curve at 19. Most likely secondary to demand as there is no chest pain.  Hypertension.  Blood pressure mildly elevated.  Patient was not on any antihypertensives at home. -Continue amlodipine 5 mg daily-can increase the dose if needed. -Continue as needed hydralazine.  AKI.  Some improvement in creatinine, it was at 1.19 this morning with baseline around 1. -Continue gentle IV hydration -Continue to monitor -Avoid nephrotoxins.  History of DVT.  Was on Eliquis at home due to history of DVT in February 2021.  CTA negative for acute PE. -Continue home dose of Eliquis.  Objective: Vitals:   04/06/20 0300 04/06/20 0400 04/06/20 0500 04/06/20 0600  BP: (!) 159/83 (!) 155/81 (!) 155/86 (!) 156/91  Pulse: 72 73 67 78  Resp: (!) 21 18 19 17   Temp:      TempSrc:      SpO2: 95% 90% 92% (!) 89%  Weight:      Height:        Intake/Output Summary (Last 24  hours) at 04/06/2020 0902 Last data filed at 04/06/2020 0600 Gross per 24 hour  Intake 8481.58 ml  Output 750 ml  Net 7731.58 ml   Filed Weights   04/03/20 1526  Weight: 68 kg    Examination:  General. Chronically ill-appearing elderly man, in no acute distress. Pulmonary.  Lungs clear bilaterally, normal respiratory effort. CV.   Regular rate and rhythm, no JVD, rub or murmur. Abdomen.  Soft, nontender, nondistended, BS positive. CNS.  Alert and oriented x3.  No focal neurologic deficit. Extremities.  No edema, no cyanosis, pulses intact and symmetrical. Psychiatry.  Judgment and insight appears normal.  DVT prophylaxis: Eliquis Code Status: Full Family Communication: Wife was updated at phone. Disposition Plan:  Status is: Inpatient  Remains inpatient appropriate because:Inpatient level of care appropriate due to severity of illness   Dispo: The patient is from: Home              Anticipated d/c is to: Home              Anticipated d/c date is: 3 days              Patient currently is not medically stable to d/c.  Patient is very high risk for deterioration and death.   Palliative care was consulted and CODE STATUS was changed to DNR after discussing with wife.  Consultants:   Pulmonary  Procedures:  Antimicrobials:  Ceftriaxone Zithromax  Data Reviewed: I have personally reviewed following labs and imaging studies  CBC: Recent Labs  Lab 04/03/20 1530 04/04/20 0436 04/05/20 0329 04/06/20 0552  WBC 5.1 2.3* 7.5 14.3*  NEUTROABS 3.9 1.7 6.2 12.3*  HGB 12.7* 11.3* 11.6* 10.6*  HCT 38.6* 34.7* 34.3* 31.9*  MCV 64.5* 66.0* 63.4* 64.1*  PLT 305 282 362 349   Basic Metabolic Panel: Recent Labs  Lab 04/03/20 1530 04/04/20 0436 04/05/20 0329 04/06/20 0552  NA 139 139 139 142  K 4.4 4.9 5.3* 3.4*  CL 109 110 110 107  CO2 15* 15* 17* 21*  GLUCOSE 187* 255* 270* 208*  BUN 15 21 31* 29*  CREATININE 1.37* 1.49* 1.31* 1.19  CALCIUM 8.2* 7.8* 7.8* 7.5*  MG  --  2.2 2.3 2.3  PHOS  --  3.3 3.0 2.8   GFR: Estimated Creatinine Clearance: 40.2 mL/min (by C-G formula based on SCr of 1.19 mg/dL). Liver Function Tests: Recent Labs  Lab 04/03/20 1530 04/04/20 0436 04/05/20 0329 04/06/20 0552  AST 102* 63* 75* 46*  ALT 84* 63* 56* 44  ALKPHOS 67 61 61 59  BILITOT 1.0 1.4* 1.1 0.9  PROT 7.8  7.1 7.4 6.2*  ALBUMIN 2.8* 2.5* 2.4* 2.2*   No results for input(s): LIPASE, AMYLASE in the last 168 hours. No results for input(s): AMMONIA in the last 168 hours. Coagulation Profile: No results for input(s): INR, PROTIME in the last 168 hours. Cardiac Enzymes: No results for input(s): CKTOTAL, CKMB, CKMBINDEX, TROPONINI in the last 168 hours. BNP (last 3 results) No results for input(s): PROBNP in the last 8760 hours. HbA1C: Recent Labs    04/03/20 2215  HGBA1C 7.1*   CBG: Recent Labs  Lab 04/05/20 0756 04/05/20 1110 04/05/20 1556 04/05/20 2215 04/06/20 0814  GLUCAP 282* 298* 186* 175* 196*   Lipid Profile: No results for input(s): CHOL, HDL, LDLCALC, TRIG, CHOLHDL, LDLDIRECT in the last 72 hours. Thyroid Function Tests: No results for input(s): TSH, T4TOTAL, FREET4, T3FREE, THYROIDAB in the last 72 hours. Anemia Panel: Recent Labs  04/05/20 0329 04/06/20 0552  FERRITIN 239 211   Sepsis Labs: Recent Labs  Lab 04/03/20 1832 04/03/20 2215 04/04/20 0856 04/04/20 1120 04/05/20 0354 04/06/20 0552  PROCALCITON  --  0.51  --   --   --   --   LATICACIDVEN   < >  --  3.0* 3.1* 3.2* 3.3*   < > = values in this interval not displayed.    Recent Results (from the past 240 hour(s))  SARS Coronavirus 2 by RT PCR (hospital order, performed in [facility] hospital lab) Nasopharyngeal Nasopharyngeal Swab     Status: Abnormal   Collection Time: 04/03/20  3:30 PM   Specimen: Nasopharyngeal Swab  Result Value Ref Range Status   SARS Coronavirus 2 POSITIVE (A) NEGATIVE Final    Comment: RESULT CALLED TO, READ BACK BY AND VERIFIED WITH: SAMANTHA HAMILTON 04/03/20 AT 1748 BY ACR (NOTE) SARS-CoV-2 target nucleic acids are DETECTED  SARS-CoV-2 RNA is generally detectable in upper respiratory specimens  during the acute phase of infection.  Positive results are indicative  of the presence of the identified virus, but do not rule out bacterial infection or co-infection with  other pathogens not detected by the test.  Clinical correlation with patient history and  other diagnostic information is necessary to determine patient infection status.  The expected result is negative.  Fact Sheet for Patients:   BoilerBrush.com.cyhttps://www.fda.gov/media/136312/download   Fact Sheet for Healthcare Providers:   https://pope.com/https://www.fda.gov/media/136313/download    This test is not yet approved or cleared by the [place] FDA and  has been authorized for detection and/or diagnosis of SARS-CoV-2 by FDA under an Emergency Use Authorization (EUA).  This EUA will remain in effect (meaning th is test can be used) for the duration of  the COVID-19 declaration under Section 564(b)(1) of the Act, 21 U.S.C. section 360-bbb-3(b)(1), unless the authorization is terminated or revoked sooner.  Performed at [facility], [address] [place], [place] [address]   CULTURE, BLOOD (ROUTINE X 2) w Reflex to ID Panel     Status: None (Preliminary result)   Collection Time: 04/04/20 11:19 AM   Specimen: BLOOD  Result Value Ref Range Status   Specimen Description BLOOD L ARM  Final   Special Requests   Final    BOTTLES DRAWN AEROBIC AND ANAEROBIC Blood Culture adequate volume   Culture   Final    NO GROWTH 2 DAYS Performed at [facility], [address] [place], [place] [address]    Report Status PENDING  Incomplete  CULTURE, BLOOD (ROUTINE X 2) w Reflex to ID Panel     Status: None (Preliminary result)   Collection Time: 04/04/20 11:22 AM   Specimen: BLOOD  Result Value Ref Range Status   Specimen Description BLOOD L HAND  Final   Special Requests   Final    BOTTLES DRAWN AEROBIC AND ANAEROBIC Blood Culture adequate volume   Culture   Final    NO GROWTH 2 DAYS Performed at [facility], [address] [place], [place] [address]    Report Status PENDING  Incomplete  MRSA PCR Screening     Status: None   Collection Time: 04/05/20 11:57 AM   Specimen: Nasal  Mucosa; Nasopharyngeal  Result Value Ref Range Status   MRSA by PCR NEGATIVE NEGATIVE Final    Comment:        The GeneXpert MRSA Assay (FDA approved for NASAL specimens only), is one component of a comprehensive MRSA colonization surveillance  program. It is not intended to diagnose MRSA infection nor to guide or monitor treatment for MRSA infections. Performed at [facility], [address], [place], [place] [address]      Radiology Studies: No results found.  Scheduled Meds: . apixaban  5 mg Oral BID  . baricitinib  2 mg Oral Daily  . Chlorhexidine Gluconate Cloth  6 each Topical Daily  . folic acid  1 mg Oral Daily  . insulin aspart  0-15 Units Subcutaneous TID WC  . insulin aspart  0-5 Units Subcutaneous QHS  . insulin detemir  10 Units Subcutaneous BID  . Ipratropium-Albuterol  1 puff Inhalation Q6H  . linagliptin  5 mg Oral Daily  . mouth rinse  15 mL Mouth Rinse BID  . methylPREDNISolone (SOLU-MEDROL) injection  40 mg Intravenous Q12H  . multivitamin with minerals  1 tablet Oral Daily  . thiamine  100 mg Oral Daily   Continuous Infusions: . azithromycin Stopped (04/06/20 0000)  . cefTRIAXone (ROCEPHIN)  IV 1 g (04/05/20 1150)  . remdesivir 100 mg in NS 100 mL 100 mg (04/05/20 0943)  .  sodium bicarbonate (isotonic) infusion in sterile water 100 mL/hr at 04/06/20 0600     LOS: 3 days   Time spent: 35 minutes  Arnetha Courser, MD [facility]  If 7PM-7AM, please contact night-coverage [email or web address]  04/06/2020, 9:02 AM   This record has been created using Conservation officer, historic buildings. Errors have been sought and corrected,but may not always be located. Such creation errors do not reflect on the standard of care.

## 2020-04-06 NOTE — Progress Notes (Signed)
Name: Stephen Paul MRN: [id number] DOB: Jan 31, 1932     CONSULTATION DATE:  Cindi Carbon MD : Nelson Chimes  FOLLOW UP: resp failure due to COVID-19    PATIENT BACKGROUND: 86 y.o. male with medical history significant of diabetes and hypertension diagnosed with COVID-19 a week PTA, admitted to progressive cardiac/inpatient with high FiO2 requirements, patient currently on high flow O2.  Allergies  Allergen Reactions  . Mycophenolate Mofetil Rash   Scheduled Meds: . apixaban  5 mg Oral BID  . Chlorhexidine Gluconate Cloth  6 each Topical Daily  . folic acid  1 mg Oral Daily  . insulin aspart  0-15 Units Subcutaneous TID WC  . insulin aspart  0-5 Units Subcutaneous QHS  . insulin detemir  15 Units Subcutaneous BID  . Ipratropium-Albuterol  1 puff Inhalation Q6H  . linagliptin  5 mg Oral Daily  . mouth rinse  15 mL Mouth Rinse BID  . methylPREDNISolone (SOLU-MEDROL) injection  40 mg Intravenous Q12H  . multivitamin with minerals  1 tablet Oral Daily  . thiamine  100 mg Oral Daily   Continuous Infusions: . azithromycin Stopped (04/06/20 0000)  . cefTRIAXone (ROCEPHIN)  IV Stopped (04/06/20 1003)  . remdesivir 100 mg in NS 100 mL Stopped (04/06/20 1023)  .  sodium bicarbonate (isotonic) infusion in sterile water 100 mL/hr at 04/06/20 1800   PRN Meds:.acetaminophen, chlorpheniramine-HYDROcodone, guaiFENesin-dextromethorphan, hydrALAZINE  Review of Systems: Limited due to respiratory distress in the setting of COVID-19, patient hard to understand with negative pressure system in the room and PPE.  Subjective: Does not voice any specific complaint.  He is hard to understand due to noise from negative pressure system in the room and PPE required.  He does appear to be eating well.  Did eat most of his breakfast.  VITAL SIGNS: Temp:  [96.3 F (35.7 C)-97.8 F (36.6 C)] 97.8 F (36.6 C) (09/11 2002) Pulse Rate:  [34-88] 77 (09/11 1800) Resp:  [13-27] 18 (09/11 1800) BP:  (135-159)/(76-96) 155/91 (09/11 1800) SpO2:  [83 %-100 %] 100 % (09/11 1800) FiO2 (%):  [60 %-90 %] 60 % (09/11 1600)     SpO2: 100 % O2 Flow Rate (L/min): 40 L/min FiO2 (%): 60 %   COVID-19 DISASTER DECLARATION:   FULL CONTACT PHYSICAL EXAMINATION WAS NOT POSSIBLE DUE TO TREATMENT OF COVID-19 AND   CONSERVATION OF PERSONAL PROTECTIVE EQUIPMENT, LIMITED EXAM FINDINGS INCLUDE:   PHYSICAL EXAMINATION:  GENERAL:critically ill appearing, mild tachypnea, on high flow O2 PULMONARY: limited exam due to PPE, coarse breath sounds no other adventitious sounds CARDIOVASCULAR: Monitor shows sinus rhythm rate 70 GASTROINTESTINAL: Soft, nontender, non-distended. Positive bowel sounds.  MUSCULOSKELETAL: No swelling, clubbing, or edema.  NEUROLOGIC: awake, interactive but difficult to understand due to issues above  SKIN:intact,warm,dry    CULTURE RESULTS   Recent Results (from the past 240 hour(s))  SARS Coronavirus 2 by RT PCR (hospital order, performed in [facility] hospital lab) Nasopharyngeal Nasopharyngeal Swab     Status: Abnormal   Collection Time: 04/03/20  3:30 PM   Specimen: Nasopharyngeal Swab  Result Value Ref Range Status   SARS Coronavirus 2 POSITIVE (A) NEGATIVE Final    Comment: RESULT CALLED TO, READ BACK BY AND VERIFIED WITH: SAMANTHA HAMILTON 04/03/20 AT 1748 BY ACR (NOTE) SARS-CoV-2 target nucleic acids are DETECTED  SARS-CoV-2 RNA is generally detectable in upper respiratory specimens  during the acute phase of infection.  Positive results are indicative  of the presence of the identified virus, but do not rule  out bacterial infection or co-infection with other pathogens not detected by the test.  Clinical correlation with patient history and  other diagnostic information is necessary to determine patient infection status.  The expected result is negative.  Fact Sheet for Patients:   BoilerBrush.com.cy   Fact Sheet for Healthcare  Providers:   https://pope.com/    This test is not yet approved or cleared by the [place] FDA and  has been authorized for detection and/or diagnosis of SARS-CoV-2 by FDA under an Emergency Use Authorization (EUA).  This EUA will remain in effect (meaning th is test can be used) for the duration of  the COVID-19 declaration under Section 564(b)(1) of the Act, 21 U.S.C. section 360-bbb-3(b)(1), unless the authorization is terminated or revoked sooner.  Performed at [facility], [address] [place], [place] [address]   CULTURE, BLOOD (ROUTINE X 2) w Reflex to ID Panel     Status: None (Preliminary result)   Collection Time: 04/04/20 11:19 AM   Specimen: BLOOD  Result Value Ref Range Status   Specimen Description BLOOD L ARM  Final   Special Requests   Final    BOTTLES DRAWN AEROBIC AND ANAEROBIC Blood Culture adequate volume   Culture   Final    NO GROWTH 2 DAYS Performed at [facility], [address], [place], [place] [address]    Report Status PENDING  Incomplete  CULTURE, BLOOD (ROUTINE X 2) w Reflex to ID Panel     Status: None (Preliminary result)   Collection Time: 04/04/20 11:22 AM   Specimen: BLOOD  Result Value Ref Range Status   Specimen Description BLOOD L HAND  Final   Special Requests   Final    BOTTLES DRAWN AEROBIC AND ANAEROBIC Blood Culture adequate volume   Culture   Final    NO GROWTH 2 DAYS Performed at [facility], [address], [place], [place] [address]    Report Status PENDING  Incomplete  MRSA PCR Screening     Status: None   Collection Time: 04/05/20 11:57 AM   Specimen: Nasal Mucosa; Nasopharyngeal  Result Value Ref Range Status   MRSA by PCR NEGATIVE NEGATIVE Final    Comment:        The GeneXpert MRSA Assay (FDA approved for NASAL specimens only), is one component of a comprehensive MRSA colonization surveillance program. It is not intended to diagnose MRSA infection nor  to guide or monitor treatment for MRSA infections. Performed at [facility], [address] [place], [place] [address]           IMAGING    No results found. CBC    Component Value Date/Time   WBC 14.3 (H) 04/06/2020 0552   RBC 4.98 04/06/2020 0552   HGB 10.6 (L) 04/06/2020 0552   HCT 31.9 (L) 04/06/2020 0552   PLT 349 04/06/2020 0552   MCV 64.1 (L) 04/06/2020 0552   MCH 21.3 (L) 04/06/2020 0552   MCHC 33.2 04/06/2020 0552   RDW 21.0 (H) 04/06/2020 0552   LYMPHSABS 0.7 04/06/2020 0552   MONOABS 1.2 (H) 04/06/2020 0552   EOSABS 0.0 04/06/2020 0552   BASOSABS 0.0 04/06/2020 0552   BMP Latest Ref Rng & Units 04/06/2020 04/05/2020 04/04/2020  Glucose 70 - 99 mg/dL 952(W) 413(K) 440(N)  BUN 8 - 23 mg/dL 02(V) 25(D) 21  Creatinine 0.61 - 1.24 mg/dL 6.64 4.03(K) 7.42(V)  Sodium 135 - 145 mmol/L 142 139 139  Potassium 3.5 - 5.1 mmol/L 3.4(L)  5.3(H) 4.9  Chloride 98 - 111 mmol/L 107 110 110  CO2 22 - 32 mmol/L 21(L) 17(L) 15(L)  Calcium 8.9 - 10.3 mg/dL 7.5(L) 7.8(L) 7.8(L)    ASSESSMENT AND PLAN SYNOPSIS  COVID-19 infection, ARDS and pneumonia/pneumonitis with acute renal failure, patient with multiorgan failure Continue IV steroids  IV remdisivir  Aggressive pulm toilet recommended Pulmonary hygiene proning as tolerated due to severe hypoxia   Maintain airborne and contact precautions  As needed bronchodilators (MDI) Vitamin C and zinc Antitussives Patient is DNR  Prognosis is exceedingly guarded due to advanced age and severity of COVID-19 infection.  Despite this appears to be making slight improvement particularly with regards to organ dysfunction. Continue supportive care.  Gailen Shelter, MD [facility]   *This note was dictated using voice recognition software/Dragon.  Despite best efforts to proofread, errors can occur which can change the meaning.  Any change was purely unintentional.

## 2020-04-07 LAB — BLOOD GAS, ARTERIAL
Acid-Base Excess: 5.4 mmol/L — ABNORMAL HIGH (ref 0.0–2.0)
Bicarbonate: 25.3 mmol/L (ref 20.0–28.0)
FIO2: 0.48
O2 Saturation: 91.2 %
Patient temperature: 37
pCO2 arterial: 23 mmHg — ABNORMAL LOW (ref 32.0–48.0)
pH, Arterial: 7.65 (ref 7.350–7.450)
pO2, Arterial: 47 mmHg — ABNORMAL LOW (ref 83.0–108.0)

## 2020-04-07 LAB — COMPREHENSIVE METABOLIC PANEL
ALT: 34 U/L (ref 0–44)
AST: 37 U/L (ref 15–41)
Albumin: 2 g/dL — ABNORMAL LOW (ref 3.5–5.0)
Alkaline Phosphatase: 59 U/L (ref 38–126)
Anion gap: 12 (ref 5–15)
BUN: 29 mg/dL — ABNORMAL HIGH (ref 8–23)
CO2: 25 mmol/L (ref 22–32)
Calcium: 7.2 mg/dL — ABNORMAL LOW (ref 8.9–10.3)
Chloride: 104 mmol/L (ref 98–111)
Creatinine, Ser: 1.07 mg/dL (ref 0.61–1.24)
GFR calc Af Amer: >60 mL/min (ref >60)
GFR calc non Af Amer: >60 mL/min (ref >60)
Glucose, Bld: 179 mg/dL — ABNORMAL HIGH (ref 70–99)
Potassium: 3.4 mmol/L — ABNORMAL LOW (ref 3.5–5.1)
Sodium: 141 mmol/L (ref 135–145)
Total Bilirubin: 0.8 mg/dL (ref 0.3–1.2)
Total Protein: 5.9 g/dL — ABNORMAL LOW (ref 6.5–8.1)

## 2020-04-07 LAB — CBC WITH DIFFERENTIAL/PLATELET
Abs Immature Granulocytes: 0.11 10*3/uL — ABNORMAL HIGH (ref 0.00–0.07)
Basophils Absolute: 0 10*3/uL (ref 0.0–0.1)
Basophils Relative: 0 %
Eosinophils Absolute: 0 10*3/uL (ref 0.0–0.5)
Eosinophils Relative: 0 %
HCT: 30 % — ABNORMAL LOW (ref 39.0–52.0)
Hemoglobin: 10.3 g/dL — ABNORMAL LOW (ref 13.0–17.0)
Immature Granulocytes: 1 %
Lymphocytes Relative: 5 %
Lymphs Abs: 0.5 10*3/uL — ABNORMAL LOW (ref 0.7–4.0)
MCH: 22 pg — ABNORMAL LOW (ref 26.0–34.0)
MCHC: 34.3 g/dL (ref 30.0–36.0)
MCV: 64 fL — ABNORMAL LOW (ref 80.0–100.0)
Monocytes Absolute: 1.1 10*3/uL — ABNORMAL HIGH (ref 0.1–1.0)
Monocytes Relative: 10 %
Neutro Abs: 9.6 10*3/uL — ABNORMAL HIGH (ref 1.7–7.7)
Neutrophils Relative %: 84 %
Platelets: 341 10*3/uL (ref 150–400)
RBC: 4.69 MIL/uL (ref 4.22–5.81)
RDW: 20.6 % — ABNORMAL HIGH (ref 11.5–15.5)
WBC: 11.4 10*3/uL — ABNORMAL HIGH (ref 4.0–10.5)
nRBC: 0 % (ref 0.0–0.2)

## 2020-04-07 LAB — VITAMIN B12: Vitamin B-12: 3862 pg/mL — ABNORMAL HIGH (ref 180–914)

## 2020-04-07 LAB — GLUCOSE, CAPILLARY
Glucose-Capillary: 175 mg/dL — ABNORMAL HIGH (ref 70–99)
Glucose-Capillary: 141 mg/dL — ABNORMAL HIGH (ref 70–99)
Glucose-Capillary: 147 mg/dL — ABNORMAL HIGH (ref 70–99)
Glucose-Capillary: 142 mg/dL — ABNORMAL HIGH (ref 70–99)

## 2020-04-07 LAB — IRON AND TIBC
Iron: 22 ug/dL — ABNORMAL LOW (ref 45–182)
Saturation Ratios: 12 % — ABNORMAL LOW (ref 17.9–39.5)
TIBC: 183 ug/dL — ABNORMAL LOW (ref 250–450)
UIBC: 161 ug/dL

## 2020-04-07 LAB — PHOSPHORUS: Phosphorus: 2.8 mg/dL (ref 2.5–4.6)

## 2020-04-07 LAB — PROCALCITONIN: Procalcitonin: <0.1 ng/mL

## 2020-04-07 LAB — LACTIC ACID, PLASMA: Lactic Acid, Venous: 4 mmol/L (ref 0.5–1.9)

## 2020-04-07 LAB — FERRITIN
Ferritin: 175 ng/mL (ref 24–336)
Ferritin: 164 ng/mL (ref 24–336)

## 2020-04-07 LAB — FIBRIN DERIVATIVES D-DIMER (ARMC ONLY): Fibrin derivatives D-dimer (ARMC): 5642.59 ng/mL (FEU) — ABNORMAL HIGH (ref 0.00–499.00)

## 2020-04-07 LAB — MAGNESIUM: Magnesium: 2.1 mg/dL (ref 1.7–2.4)

## 2020-04-07 LAB — RETICULOCYTES
Immature Retic Fract: 10.4 % (ref 2.3–15.9)
RBC.: 4.78 MIL/uL (ref 4.22–5.81)
Retic Count, Absolute: 28.2 10*3/uL (ref 19.0–186.0)
Retic Ct Pct: 0.6 % (ref 0.4–3.1)

## 2020-04-07 LAB — C-REACTIVE PROTEIN: CRP: 3.5 mg/dL — ABNORMAL HIGH (ref <1.0)

## 2020-04-07 LAB — FOLATE: Folate: 8.5 ng/mL (ref >5.9)

## 2020-04-07 MED ORDER — METOPROLOL TARTRATE 5 MG/5ML IV SOLN
INTRAVENOUS | Status: AC
  Filled 2020-04-07: qty 5

## 2020-04-07 MED ORDER — FE FUMARATE-B12-VIT C-FA-IFC PO CAPS
1.0000 | ORAL_CAPSULE | Freq: Two times a day (BID) | ORAL | Status: DC
  Administered 2020-04-07 – 2020-04-24 (×33): 1 via ORAL
  Filled 2020-04-07 (×37): qty 1

## 2020-04-07 MED ORDER — POTASSIUM CHLORIDE CRYS ER 20 MEQ PO TBCR
40.0000 meq | EXTENDED_RELEASE_TABLET | Freq: Once | ORAL | Status: AC
  Administered 2020-04-07: 40 meq via ORAL
  Filled 2020-04-07: qty 2

## 2020-04-07 MED ORDER — AZITHROMYCIN 250 MG PO TABS
500.0000 mg | ORAL_TABLET | Freq: Every day | ORAL | Status: AC
  Administered 2020-04-07: 500 mg via ORAL
  Filled 2020-04-07: qty 2

## 2020-04-07 NOTE — Progress Notes (Addendum)
Called by patient's RN about patient continuing to saturate in the high 70's to low 80's. Told patient RN I would bring a heated high flow to the room. Patient placed on heated high flow [place] with 100% NRB mask as well.

## 2020-04-07 NOTE — Progress Notes (Addendum)
PROGRESS NOTE    Stephen Paul  JJK:[id number] DOB: [birth year] DOA: 04/03/2020 PCP: Barbette Reichmann, MD   Brief Narrative: Taken from H&P Stephen Paul is a 86 y.o. male with medical history significant of diabetes and hypertension who apparently was told about a week ago that he was positive for COVID-19.  Came to ED with worsening shortness of breath.  Found to be hypoxic in 70s.  Chest x-ray consistent with COVID-19 pneumonia.  CTA negative. Patient is unvaccinated.  Subjective: Patient was feeling very fatigued when seen today.  Stating that I am having bad" times and now it is my bad time.  He was saturating 89 to 90% on 6 L, Increased oxygen to 7 to keep saturation above 90%.  Assessment & Plan:   Principal Problem:   Acute respiratory failure due to COVID-19 [facility]) Active Problems:   COPD with acute exacerbation (HCC)   Essential hypertension   Diabetes (HCC)   Goals of care, counseling/discussion   Palliative care by specialist   DNR (do not resuscitate)  Acute hypoxic respiratory failure secondary to COVID-19 pneumonia/sepsis secondary to COVID-19 infection. Patient did meet sepsis criteria on presentation with being tachycardic, tachypneic, endorgan damage with lactic acidosis, AKI, elevated T bili and COVID-19 infection.  Patient did not receive any fluid on admission.  No cultures obtained.  Procalcitonin elevated at 0.51.  Started improving elevated inflammatory markers.  UA was positive for ketones but not very impressive for any infection.  Blood cultures negative.  Transferred to progressive care unit from stepdown. D-dimer and CRP started improving. -Continue remdesivir and Solu-Medrol- day 5 -Discontinue baricitinib due to worsening leukocytosis. -Continue supportive care. -Continue supplements. -Continue to monitor inflammatory markers. -Continue ceftriaxone. -Continue Zithromax -Palliative care consult to discuss the goals of care. -CODE STATUS changed to  DNR.  Non anion gap metabolic acidosis/lactic acidosis.  Resolved.  Lactic acid remained at 4 today.  CK within normal limit.  No history of liver disease, liver functions normal.  Patient is hypoxic and lethargic secondary to COVID-19 pneumonia. -Continue bicarb infusion. -Monitor lactic acid. -Continue to monitor  Anemia.  Hemoglobin at 10.3.  Anemia panel ordered.  Initial results with some iron deficiency along with low TIBC and saturation, more consistent with anemia of chronic disease with some iron deficiency.  Ferritin within normal range. -Can try some iron and see if that will help.  Type 2 diabetes mellitus.  Patient to have ketones in his urine most likely secondary to poor p.o. intake.  No anion gap.  A1c of 7.1.  CBG elevated with some improvement after increasing Levemir, as patient is on steroid.  Patient was on Metformin and glipizide at home. -Increase Levemir to 15 units twice daily. -Add 4 units with meals if you eat more than 50%. -Continue moderate scale sliding scale. -Continue to monitor.  Elevated troponin.  Mildly elevated troponin with a flat curve at 19. Most likely secondary to demand as there is no chest pain.  Hypertension.  Blood pressure mildly elevated.  Patient was not on any antihypertensives at home. -Increase amlodipine to 10 mg daily. -Continue as needed hydralazine.  AKI.  Creatinine continue to improve, it was at 1.07 this morning with baseline around 1. -Continue gentle IV hydration -Continue to monitor -Avoid nephrotoxins.  History of DVT.  Was on Eliquis at home due to history of DVT in February 2021.  CTA negative for acute PE. -Continue home dose of Eliquis.  Objective: Vitals:   04/07/20 0300 04/07/20 0400  04/07/20 0500 04/07/20 0600  BP:  (!) 159/80 (!) 154/81 (!) 145/90  Pulse: 73 74 (!) 59 79  Resp: (!) 26 (!) 21 (!) 22 20  Temp:  98.6 F (37 C)    TempSrc:  Oral    SpO2: 97% 98% 99% 96%  Weight:      Height:         Intake/Output Summary (Last 24 hours) at 04/07/2020 0740 Last data filed at 04/07/2020 0600 Gross per 24 hour  Intake 2648.01 ml  Output 1150 ml  Net 1498.01 ml   Filed Weights   04/03/20 1526  Weight: 68 kg    Examination:  General.  Well-developed elderly man, appears lethargic, in no acute distress. Pulmonary.  Lungs clear bilaterally, normal respiratory effort. CV.  Regular rate and rhythm, no JVD, rub or murmur. Abdomen.  Soft, nontender, nondistended, BS positive. CNS.  Alert and oriented x3.  No focal neurologic deficit. Extremities.  No edema, no cyanosis, pulses intact and symmetrical. Psychiatry.  Judgment and insight appears normal.  DVT prophylaxis: Eliquis Code Status: Full Family Communication: Wife was updated at phone. Disposition Plan:  Status is: Inpatient  Remains inpatient appropriate because:Inpatient level of care appropriate due to severity of illness   Dispo: The patient is from: Home              Anticipated d/c is to: Home              Anticipated d/c date is: 3 days              Patient currently is not medically stable to d/c.  Patient is very high risk for deterioration and death.   Palliative care was consulted and CODE STATUS was changed to DNR after discussing with wife.  Consultants:   Pulmonary  Procedures:  Antimicrobials:  Ceftriaxone Zithromax  Data Reviewed: I have personally reviewed following labs and imaging studies  CBC: Recent Labs  Lab 04/03/20 1530 04/04/20 0436 04/05/20 0329 04/06/20 0552 04/07/20 0522  WBC 5.1 2.3* 7.5 14.3* 11.4*  NEUTROABS 3.9 1.7 6.2 12.3* 9.6*  HGB 12.7* 11.3* 11.6* 10.6* 10.3*  HCT 38.6* 34.7* 34.3* 31.9* 30.0*  MCV 64.5* 66.0* 63.4* 64.1* 64.0*  PLT 305 282 362 349 341   Basic Metabolic Panel: Recent Labs  Lab 04/03/20 1530 04/04/20 0436 04/05/20 0329 04/06/20 0552 04/07/20 0522  NA 139 139 139 142 141  K 4.4 4.9 5.3* 3.4* 3.4*  CL 109 110 110 107 104  CO2 15* 15* 17*  21* 25  GLUCOSE 187* 255* 270* 208* 179*  BUN 15 21 31* 29* 29*  CREATININE 1.37* 1.49* 1.31* 1.19 1.07  CALCIUM 8.2* 7.8* 7.8* 7.5* 7.2*  MG  --  2.2 2.3 2.3 2.1  PHOS  --  3.3 3.0 2.8 2.8   GFR: Estimated Creatinine Clearance: 44.7 mL/min (by C-G formula based on SCr of 1.07 mg/dL). Liver Function Tests: Recent Labs  Lab 04/03/20 1530 04/04/20 0436 04/05/20 0329 04/06/20 0552 04/07/20 0522  AST 102* 63* 75* 46* 37  ALT 84* 63* 56* 44 34  ALKPHOS 67 61 61 59 59  BILITOT 1.0 1.4* 1.1 0.9 0.8  PROT 7.8 7.1 7.4 6.2* 5.9*  ALBUMIN 2.8* 2.5* 2.4* 2.2* 2.0*   No results for input(s): LIPASE, AMYLASE in the last 168 hours. No results for input(s): AMMONIA in the last 168 hours. Coagulation Profile: No results for input(s): INR, PROTIME in the last 168 hours. Cardiac Enzymes: Recent Labs  Lab 04/06/20  0552  CKTOTAL 225   BNP (last 3 results) No results for input(s): PROBNP in the last 8760 hours. HbA1C: No results for input(s): HGBA1C in the last 72 hours. CBG: Recent Labs  Lab 04/05/20 2215 04/06/20 0814 04/06/20 1122 04/06/20 1638 04/06/20 2144  GLUCAP 175* 196* 219* 219* 196*   Lipid Profile: No results for input(s): CHOL, HDL, LDLCALC, TRIG, CHOLHDL, LDLDIRECT in the last 72 hours. Thyroid Function Tests: No results for input(s): TSH, T4TOTAL, FREET4, T3FREE, THYROIDAB in the last 72 hours. Anemia Panel: Recent Labs    04/06/20 0552 04/07/20 0522  FERRITIN 211 175   Sepsis Labs: Recent Labs  Lab 04/03/20 1832 04/03/20 2215 04/04/20 0856 04/04/20 1120 04/05/20 0354 04/06/20 0552  PROCALCITON  --  0.51  --   --   --  0.18  LATICACIDVEN   < >  --  3.0* 3.1* 3.2* 3.3*   < > = values in this interval not displayed.    Recent Results (from the past 240 hour(s))  SARS Coronavirus 2 by RT PCR (hospital order, performed in [facility] hospital lab) Nasopharyngeal Nasopharyngeal Swab     Status: Abnormal   Collection Time: 04/03/20  3:30 PM   Specimen:  Nasopharyngeal Swab  Result Value Ref Range Status   SARS Coronavirus 2 POSITIVE (A) NEGATIVE Final    Comment: RESULT CALLED TO, READ BACK BY AND VERIFIED WITH: SAMANTHA HAMILTON 04/03/20 AT 1748 BY ACR (NOTE) SARS-CoV-2 target nucleic acids are DETECTED  SARS-CoV-2 RNA is generally detectable in upper respiratory specimens  during the acute phase of infection.  Positive results are indicative  of the presence of the identified virus, but do not rule out bacterial infection or co-infection with other pathogens not detected by the test.  Clinical correlation with patient history and  other diagnostic information is necessary to determine patient infection status.  The expected result is negative.  Fact Sheet for Patients:   BoilerBrush.com.cy   Fact Sheet for Healthcare Providers:   https://pope.com/    This test is not yet approved or cleared by the [place] FDA and  has been authorized for detection and/or diagnosis of SARS-CoV-2 by FDA under an Emergency Use Authorization (EUA).  This EUA will remain in effect (meaning th is test can be used) for the duration of  the COVID-19 declaration under Section 564(b)(1) of the Act, 21 U.S.C. section 360-bbb-3(b)(1), unless the authorization is terminated or revoked sooner.  Performed at [facility], [address] [place], [place] [address]   CULTURE, BLOOD (ROUTINE X 2) w Reflex to ID Panel     Status: None (Preliminary result)   Collection Time: 04/04/20 11:19 AM   Specimen: BLOOD  Result Value Ref Range Status   Specimen Description BLOOD L ARM  Final   Special Requests   Final    BOTTLES DRAWN AEROBIC AND ANAEROBIC Blood Culture adequate volume   Culture   Final    NO GROWTH 3 DAYS Performed at [facility], [address], [place], [place] [address]    Report Status PENDING  Incomplete  CULTURE, BLOOD (ROUTINE X 2) w Reflex to ID Panel     Status: None  (Preliminary result)   Collection Time: 04/04/20 11:22 AM   Specimen: BLOOD  Result Value Ref Range Status   Specimen Description BLOOD L HAND  Final   Special Requests   Final    BOTTLES DRAWN AEROBIC AND ANAEROBIC Blood Culture adequate volume   Culture   Final  NO GROWTH 3 DAYS Performed at [facility], [address] [place], [place] [address]    Report Status PENDING  Incomplete  MRSA PCR Screening     Status: None   Collection Time: 04/05/20 11:57 AM   Specimen: Nasal Mucosa; Nasopharyngeal  Result Value Ref Range Status   MRSA by PCR NEGATIVE NEGATIVE Final    Comment:        The GeneXpert MRSA Assay (FDA approved for NASAL specimens only), is one component of a comprehensive MRSA colonization surveillance program. It is not intended to diagnose MRSA infection nor to guide or monitor treatment for MRSA infections. Performed at [facility], [address] [place], [place] [address]      Radiology Studies: No results found.  Scheduled Meds: . apixaban  5 mg Oral BID  . Chlorhexidine Gluconate Cloth  6 each Topical Daily  . folic acid  1 mg Oral Daily  . insulin aspart  0-15 Units Subcutaneous TID WC  . insulin aspart  0-5 Units Subcutaneous QHS  . insulin detemir  15 Units Subcutaneous BID  . Ipratropium-Albuterol  1 puff Inhalation Q6H  . linagliptin  5 mg Oral Daily  . mouth rinse  15 mL Mouth Rinse BID  . methylPREDNISolone (SOLU-MEDROL) injection  40 mg Intravenous Q12H  . multivitamin with minerals  1 tablet Oral Daily  . thiamine  100 mg Oral Daily   Continuous Infusions: . azithromycin Stopped (04/06/20 2247)  . cefTRIAXone (ROCEPHIN)  IV Stopped (04/06/20 1003)  . remdesivir 100 mg in NS 100 mL Stopped (04/06/20 1023)  .  sodium bicarbonate (isotonic) infusion in sterile water 100 mL/hr at 04/07/20 0600     LOS: 4 days   Time spent: 35 minutes  Arnetha CourserSumayya Garfield Coiner, MD [facility]  If 7PM-7AM, please contact  night-coverage [email or web address]  04/07/2020, 7:40 AM   This record has been created using Conservation officer, historic buildingsDragon voice recognition software. Errors have been sought and corrected,but may not always be located. Such creation errors do not reflect on the standard of care.

## 2020-04-07 NOTE — Progress Notes (Signed)
Patient O2 sats dropped to 83% with rolling to side.   Repositioned-  Still staying around 84-85%.   Increase O2 to 10L HFNC.  Sats did not improve- started to drop to 82%.   Increase to 15L - Sats around 88-90%.    Patient is labored breathing through mouth.   Encouraged deep breaths- in through nose and out through mouth

## 2020-04-07 NOTE — Progress Notes (Addendum)
Patient O2 saturation dropped while turning in bed.   [place] came off for a second.  O2 sat dropped to 74%.   Replaced HFNC at 15L.   Patient still around 80-79%.   Placed on NRB-  sats improved to 93-97%  Patient anxious and started to tremble .  Practice deep breathing exercise to get breathing to calm down.  Will continue to monitor   MD and Resp. Aware

## 2020-04-07 NOTE — Progress Notes (Signed)
PHARMACIST - PHYSICIAN COMMUNICATION DR:   Nelson Chimes CONCERNING: Antibiotic IV to Oral Route Change Policy  RECOMMENDATION: This patient is receiving Azitromycin by the intravenous route.  Based on criteria approved by the Pharmacy and Therapeutics Committee, the antibiotic(s) is/are being converted to the equivalent oral dose form(s).   DESCRIPTION: These criteria include:  Patient being treated for a respiratory tract infection, urinary tract infection, cellulitis or clostridium difficile associated diarrhea if on metronidazole  The patient is not neutropenic and does not exhibit a GI malabsorption state  The patient is eating (either orally or via tube) and/or has been taking other orally administered medications for a least 24 hours  The patient is improving clinically and has a Tmax < 100.5  If you have questions about this conversion, please contact the Pharmacy Department    Albina Billet, PharmD, BCPS Clinical Pharmacist 04/07/2020 2:34 PM

## 2020-04-07 NOTE — Progress Notes (Signed)
CRITICAL VALUE ALERT  Critical Value:  AG- ph 7.65 Date & Time Notied:  04/07/2020  1330  Provider Notified: Paged MD

## 2020-04-08 ENCOUNTER — Inpatient Hospital Stay: Payer: Medicare Other

## 2020-04-08 LAB — GLUCOSE, CAPILLARY
Glucose-Capillary: 104 mg/dL — ABNORMAL HIGH (ref 70–99)
Glucose-Capillary: 119 mg/dL — ABNORMAL HIGH (ref 70–99)
Glucose-Capillary: 174 mg/dL — ABNORMAL HIGH (ref 70–99)
Glucose-Capillary: 167 mg/dL — ABNORMAL HIGH (ref 70–99)
Glucose-Capillary: 184 mg/dL — ABNORMAL HIGH (ref 70–99)

## 2020-04-08 LAB — COMPREHENSIVE METABOLIC PANEL
ALT: 30 U/L (ref 0–44)
AST: 39 U/L (ref 15–41)
Albumin: 2.2 g/dL — ABNORMAL LOW (ref 3.5–5.0)
Alkaline Phosphatase: 65 U/L (ref 38–126)
Anion gap: 13 (ref 5–15)
BUN: 25 mg/dL — ABNORMAL HIGH (ref 8–23)
CO2: 29 mmol/L (ref 22–32)
Calcium: 6.9 mg/dL — ABNORMAL LOW (ref 8.9–10.3)
Chloride: 99 mmol/L (ref 98–111)
Creatinine, Ser: 1.28 mg/dL — ABNORMAL HIGH (ref 0.61–1.24)
GFR calc Af Amer: 58 mL/min — ABNORMAL LOW (ref >60)
GFR calc non Af Amer: 50 mL/min — ABNORMAL LOW (ref >60)
Glucose, Bld: 89 mg/dL (ref 70–99)
Potassium: 3.9 mmol/L (ref 3.5–5.1)
Sodium: 141 mmol/L (ref 135–145)
Total Bilirubin: 0.9 mg/dL (ref 0.3–1.2)
Total Protein: 5.9 g/dL — ABNORMAL LOW (ref 6.5–8.1)

## 2020-04-08 LAB — CBC WITH DIFFERENTIAL/PLATELET
Abs Immature Granulocytes: 0.16 10*3/uL — ABNORMAL HIGH (ref 0.00–0.07)
Basophils Absolute: 0 10*3/uL (ref 0.0–0.1)
Basophils Relative: 0 %
Eosinophils Absolute: 0 10*3/uL (ref 0.0–0.5)
Eosinophils Relative: 0 %
HCT: 31.3 % — ABNORMAL LOW (ref 39.0–52.0)
Hemoglobin: 10.4 g/dL — ABNORMAL LOW (ref 13.0–17.0)
Immature Granulocytes: 1 %
Lymphocytes Relative: 4 %
Lymphs Abs: 0.5 10*3/uL — ABNORMAL LOW (ref 0.7–4.0)
MCH: 21.5 pg — ABNORMAL LOW (ref 26.0–34.0)
MCHC: 33.2 g/dL (ref 30.0–36.0)
MCV: 64.8 fL — ABNORMAL LOW (ref 80.0–100.0)
Monocytes Absolute: 1.1 10*3/uL — ABNORMAL HIGH (ref 0.1–1.0)
Monocytes Relative: 8 %
Neutro Abs: 11.2 10*3/uL — ABNORMAL HIGH (ref 1.7–7.7)
Neutrophils Relative %: 87 %
Platelets: 363 10*3/uL (ref 150–400)
RBC: 4.83 MIL/uL (ref 4.22–5.81)
RDW: 20 % — ABNORMAL HIGH (ref 11.5–15.5)
Smear Review: NORMAL
WBC: 12.9 10*3/uL — ABNORMAL HIGH (ref 4.0–10.5)
nRBC: 0.2 % (ref 0.0–0.2)

## 2020-04-08 LAB — C-REACTIVE PROTEIN: CRP: 5.1 mg/dL — ABNORMAL HIGH (ref <1.0)

## 2020-04-08 LAB — MAGNESIUM: Magnesium: 2.2 mg/dL (ref 1.7–2.4)

## 2020-04-08 LAB — FERRITIN: Ferritin: 143 ng/mL (ref 24–336)

## 2020-04-08 LAB — PHOSPHORUS: Phosphorus: 3.8 mg/dL (ref 2.5–4.6)

## 2020-04-08 LAB — PROCALCITONIN: Procalcitonin: <0.1 ng/mL

## 2020-04-08 LAB — FIBRIN DERIVATIVES D-DIMER (ARMC ONLY): Fibrin derivatives D-dimer (ARMC): 5067.2 ng/mL (FEU) — ABNORMAL HIGH (ref 0.00–499.00)

## 2020-04-08 MED ORDER — LACTATED RINGERS IV SOLN: INTRAVENOUS | Status: DC

## 2020-04-08 NOTE — Care Management Important Message (Signed)
Important Message  Patient Details  Name: Stephen Paul MRN: [id number] Date of Birth: [birth year]   Medicare Important Message Given:  Other (see comment)  Left message with wife to review Medicare IM.     Johnell Comings 04/08/2020, 2:40 PM

## 2020-04-08 NOTE — Progress Notes (Signed)
PT Cancellation Note  Patient Details Name: Stephen Paul MRN: [id number] DOB: 12/17/1931   Cancelled Treatment:    Reason Eval/Treat Not Completed: Medical issues which prohibited therapy (Chart reviewed. Pt on 6L/min at time of PT order, subseqeuntly moved to heated HF, now on 55L. Will hold PT evaluation until later date/time once respiratory status is stable and appropriate.)  8:58 AM, 04/08/20 Rosamaria Lints, PT, DPT Physical Therapist - [facility]  [phone] (ASCOM)    Araya Roel C 04/08/2020, 8:58 AM

## 2020-04-08 NOTE — Progress Notes (Signed)
PROGRESS NOTE    Stephen Paul  ZOX:[id number] DOB: 12-Aug-1931 DOA: 04/03/2020 PCP: Barbette Reichmann, MD   Brief Narrative: Taken from H&P Stephen Paul is a 86 y.o. male with medical history significant of diabetes and hypertension who apparently was told about a week ago that he was positive for COVID-19.  Came to ED with worsening shortness of breath.  Found to be hypoxic in 70s.  Chest x-ray consistent with COVID-19 pneumonia.  CTA negative. Patient is unvaccinated.  Subjective: Deteriorated overnight, now back on heated HFNC at 55 L. Patient appears very lethargic.  Denies any new complaint.  Assessment & Plan:   Principal Problem:   Acute respiratory failure due to COVID-19 [facility]) Active Problems:   COPD with acute exacerbation (HCC)   Essential hypertension   Diabetes (HCC)   Goals of care, counseling/discussion   Palliative care by specialist   DNR (do not resuscitate)  Acute hypoxic respiratory failure secondary to COVID-19 pneumonia/sepsis secondary to COVID-19 infection. Patient did meet sepsis criteria on presentation with being tachycardic, tachypneic, endorgan damage with lactic acidosis, AKI, elevated T bili and COVID-19 infection.  Patient did not receive any fluid on admission.  No cultures obtained.  Procalcitonin elevated at 0.51.  Started improving elevated inflammatory markers.  UA was positive for ketones but not very impressive for any infection.  Blood cultures negative. Completed the course of remdesivir, baricitinib was given initially and discontinued due to worsening leukocytosis. D-dimer and CRP started improving. -Continue Solu-Medrol- day 6 -Continue supplemental oxygen with heated HFNC to keep saturation above 88%. -Continue supportive care. -Continue supplements. -Continue to monitor inflammatory markers. -Continue ceftriaxone. -Continue Zithromax -Repeat chest x-ray. -Palliative care consult to discuss the goals of care. -CODE STATUS changed  to DNR.  Non anion gap metabolic acidosis/lactic acidosis.  Resolved.  Lactic acid remained elevated.  CK within normal limit.  No history of liver disease, liver functions normal.  Patient is hypoxic and lethargic secondary to COVID-19 pneumonia. -Switch bicarb infusion with LR- very poor PO intake. -Continue to monitor  Anemia.  Hemoglobin at 10.3.  Anemia panel ordered.  Initial results with some iron deficiency along with low TIBC and saturation, more consistent with anemia of chronic disease with some iron deficiency.  Ferritin within normal range. -Can try some iron and see if that will help.  Type 2 diabetes mellitus.  Patient to have ketones in his urine most likely secondary to poor p.o. intake.  No anion gap.  A1c of 7.1.  CBG elevated with some improvement after increasing Levemir, as patient is on steroid.  Patient was on Metformin and glipizide at home. -Increase Levemir to 15 units twice daily. -Add 4 units with meals if you eat more than 50%. -Continue moderate scale sliding scale. -Continue to monitor.  Elevated troponin.  Mildly elevated troponin with a flat curve at 19. Most likely secondary to demand as there is no chest pain.  Hypertension.  Blood pressure mildly elevated.  Patient was not on any antihypertensives at home. -Increase amlodipine to 10 mg daily. -Continue as needed hydralazine.  AKI.  Creatinine with some worsening, it was 1.28 this morning with baseline around 1. -Continue gentle IV hydration -Continue to monitor -Avoid nephrotoxins.  History of DVT.  Was on Eliquis at home due to history of DVT in February 2021.  CTA negative for acute PE. -Continue home dose of Eliquis.  Objective: Vitals:   04/08/20 0550 04/08/20 0600 04/08/20 0700 04/08/20 0735  BP: (!) 157/86 (!) 151/86 Marland Kitchen)  144/92   Pulse: 76 72 75   Resp: 18 18 18    Temp: (!) 97 F (36.1 C)  97.7 F (36.5 C)   TempSrc: Oral  Axillary   SpO2: 97% 99% 98% 96%  Weight:      Height:          Intake/Output Summary (Last 24 hours) at 04/08/2020 0841 Last data filed at 04/08/2020 0555 Gross per 24 hour  Intake --  Output 900 ml  Net -900 ml   Filed Weights   04/03/20 1526  Weight: 68 kg    Examination:  General.  Chronically ill-appearing elderly man, appears lethargic, in no acute distress. Pulmonary.  Lungs clear bilaterally, normal respiratory effort. CV.  Regular rate and rhythm, no JVD, rub or murmur. Abdomen.  Soft, nontender, nondistended, BS positive. CNS.  Alert and oriented x3.  No focal neurologic deficit. Extremities.  No edema, no cyanosis, pulses intact and symmetrical. Psychiatry.  Judgment and insight appears normal.  DVT prophylaxis: Eliquis Code Status: Full Family Communication: Wife was updated at phone. Disposition Plan:  Status is: Inpatient  Remains inpatient appropriate because:Inpatient level of care appropriate due to severity of illness   Dispo: The patient is from: Home              Anticipated d/c is to: Home              Anticipated d/c date is: 3 days              Patient currently is not medically stable to d/c.  Patient is very high risk for deterioration and death.   Palliative care was consulted and CODE STATUS was changed to DNR after discussing with wife.  Consultants:   Pulmonary  Procedures:  Antimicrobials:  Ceftriaxone Zithromax  Data Reviewed: I have personally reviewed following labs and imaging studies  CBC: Recent Labs  Lab 04/04/20 0436 04/05/20 0329 04/06/20 0552 04/07/20 0522 04/08/20 0518  WBC 2.3* 7.5 14.3* 11.4* 12.9*  NEUTROABS 1.7 6.2 12.3* 9.6* 11.2*  HGB 11.3* 11.6* 10.6* 10.3* 10.4*  HCT 34.7* 34.3* 31.9* 30.0* 31.3*  MCV 66.0* 63.4* 64.1* 64.0* 64.8*  PLT 282 362 349 341 363   Basic Metabolic Panel: Recent Labs  Lab 04/04/20 0436 04/05/20 0329 04/06/20 0552 04/07/20 0522 04/08/20 0518  NA 139 139 142 141 141  K 4.9 5.3* 3.4* 3.4* 3.9  CL 110 110 107 104 99  CO2 15* 17* 21*  25 29  GLUCOSE 255* 270* 208* 179* 89  BUN 21 31* 29* 29* 25*  CREATININE 1.49* 1.31* 1.19 1.07 1.28*  CALCIUM 7.8* 7.8* 7.5* 7.2* 6.9*  MG 2.2 2.3 2.3 2.1 2.2  PHOS 3.3 3.0 2.8 2.8 3.8   GFR: Estimated Creatinine Clearance: 37.4 mL/min (A) (by C-G formula based on SCr of 1.28 mg/dL (H)). Liver Function Tests: Recent Labs  Lab 04/04/20 0436 04/05/20 0329 04/06/20 0552 04/07/20 0522 04/08/20 0518  AST 63* 75* 46* 37 39  ALT 63* 56* 44 34 30  ALKPHOS 61 61 59 59 65  BILITOT 1.4* 1.1 0.9 0.8 0.9  PROT 7.1 7.4 6.2* 5.9* 5.9*  ALBUMIN 2.5* 2.4* 2.2* 2.0* 2.2*   No results for input(s): LIPASE, AMYLASE in the last 168 hours. No results for input(s): AMMONIA in the last 168 hours. Coagulation Profile: No results for input(s): INR, PROTIME in the last 168 hours. Cardiac Enzymes: Recent Labs  Lab 04/06/20 0552  CKTOTAL 225   BNP (last 3 results) No results  for input(s): PROBNP in the last 8760 hours. HbA1C: No results for input(s): HGBA1C in the last 72 hours. CBG: Recent Labs  Lab 04/07/20 0730 04/07/20 1209 04/07/20 1639 04/07/20 1954 04/08/20 0757  GLUCAP 175* 141* 147* 142* 104*   Lipid Profile: No results for input(s): CHOL, HDL, LDLCALC, TRIG, CHOLHDL, LDLDIRECT in the last 72 hours. Thyroid Function Tests: No results for input(s): TSH, T4TOTAL, FREET4, T3FREE, THYROIDAB in the last 72 hours. Anemia Panel: Recent Labs    04/07/20 0522 04/07/20 1025 04/08/20 0518  VITAMINB12  --  3,862*  --   FOLATE 8.5  --   --   FERRITIN 164  175  --  143  TIBC 183*  --   --   IRON 22*  --   --   RETICCTPCT 0.6  --   --    Sepsis Labs: Recent Labs  Lab 04/03/20 2215 04/04/20 0856 04/04/20 1120 04/05/20 0354 04/06/20 0552 04/07/20 0522 04/07/20 1025  PROCALCITON 0.51  --   --   --  0.18 <0.10  --   LATICACIDVEN  --    < > 3.1* 3.2* 3.3*  --  4.0*   < > = values in this interval not displayed.    Recent Results (from the past 240 hour(s))  SARS  Coronavirus 2 by RT PCR (hospital order, performed in [facility] hospital lab) Nasopharyngeal Nasopharyngeal Swab     Status: Abnormal   Collection Time: 04/03/20  3:30 PM   Specimen: Nasopharyngeal Swab  Result Value Ref Range Status   SARS Coronavirus 2 POSITIVE (A) NEGATIVE Final    Comment: RESULT CALLED TO, READ BACK BY AND VERIFIED WITH: SAMANTHA HAMILTON 04/03/20 AT 1748 BY ACR (NOTE) SARS-CoV-2 target nucleic acids are DETECTED  SARS-CoV-2 RNA is generally detectable in upper respiratory specimens  during the acute phase of infection.  Positive results are indicative  of the presence of the identified virus, but do not rule out bacterial infection or co-infection with other pathogens not detected by the test.  Clinical correlation with patient history and  other diagnostic information is necessary to determine patient infection status.  The expected result is negative.  Fact Sheet for Patients:   BoilerBrush.com.cy   Fact Sheet for Healthcare Providers:   https://pope.com/    This test is not yet approved or cleared by the [place] FDA and  has been authorized for detection and/or diagnosis of SARS-CoV-2 by FDA under an Emergency Use Authorization (EUA).  This EUA will remain in effect (meaning th is test can be used) for the duration of  the COVID-19 declaration under Section 564(b)(1) of the Act, 21 U.S.C. section 360-bbb-3(b)(1), unless the authorization is terminated or revoked sooner.  Performed at [facility], [address] [place], [place] [address]   CULTURE, BLOOD (ROUTINE X 2) w Reflex to ID Panel     Status: None (Preliminary result)   Collection Time: 04/04/20 11:19 AM   Specimen: BLOOD  Result Value Ref Range Status   Specimen Description BLOOD L ARM  Final   Special Requests   Final    BOTTLES DRAWN AEROBIC AND ANAEROBIC Blood Culture adequate volume   Culture   Final    NO GROWTH 4  DAYS Performed at [facility], [address] [place], [place] [address]    Report Status PENDING  Incomplete  CULTURE, BLOOD (ROUTINE X 2) w Reflex to ID Panel     Status: None (Preliminary result)   Collection Time: 04/04/20  11:22 AM   Specimen: BLOOD  Result Value Ref Range Status   Specimen Description BLOOD L HAND  Final   Special Requests   Final    BOTTLES DRAWN AEROBIC AND ANAEROBIC Blood Culture adequate volume   Culture   Final    NO GROWTH 4 DAYS Performed at [facility], [address] [place], [place] [address]    Report Status PENDING  Incomplete  MRSA PCR Screening     Status: None   Collection Time: 04/05/20 11:57 AM   Specimen: Nasal Mucosa; Nasopharyngeal  Result Value Ref Range Status   MRSA by PCR NEGATIVE NEGATIVE Final    Comment:        The GeneXpert MRSA Assay (FDA approved for NASAL specimens only), is one component of a comprehensive MRSA colonization surveillance program. It is not intended to diagnose MRSA infection nor to guide or monitor treatment for MRSA infections. Performed at [facility], [address] [place], [place] [address]      Radiology Studies: No results found.  Scheduled Meds: . apixaban  5 mg Oral BID  . Chlorhexidine Gluconate Cloth  6 each Topical Daily  . ferrous fumarate-b12-vitamic C-folic acid  1 capsule Oral BID PC  . insulin aspart  0-15 Units Subcutaneous TID WC  . insulin aspart  0-5 Units Subcutaneous QHS  . insulin detemir  15 Units Subcutaneous BID  . Ipratropium-Albuterol  1 puff Inhalation Q6H  . linagliptin  5 mg Oral Daily  . mouth rinse  15 mL Mouth Rinse BID  . methylPREDNISolone (SOLU-MEDROL) injection  40 mg Intravenous Q12H  . multivitamin with minerals  1 tablet Oral Daily  . thiamine  100 mg Oral Daily   Continuous Infusions: . cefTRIAXone (ROCEPHIN)  IV 1 g (04/07/20 0916)  .  sodium bicarbonate (isotonic) infusion in sterile water 100 mL/hr at 04/08/20  0006     LOS: 5 days   Time spent: 35 minutes  Arnetha CourserSumayya Lawton Dollinger, MD [facility]  If 7PM-7AM, please contact night-coverage [email or web address]  04/08/2020, 8:41 AM   This record has been created using Conservation officer, historic buildingsDragon voice recognition software. Errors have been sought and corrected,but may not always be located. Such creation errors do not reflect on the standard of care.

## 2020-04-09 LAB — CBC
HCT: 31.3 % — ABNORMAL LOW (ref 39.0–52.0)
Hemoglobin: 10.3 g/dL — ABNORMAL LOW (ref 13.0–17.0)
MCH: 21.4 pg — ABNORMAL LOW (ref 26.0–34.0)
MCHC: 32.9 g/dL (ref 30.0–36.0)
MCV: 65.1 fL — ABNORMAL LOW (ref 80.0–100.0)
Platelets: 379 10*3/uL (ref 150–400)
RBC: 4.81 MIL/uL (ref 4.22–5.81)
RDW: 19.7 % — ABNORMAL HIGH (ref 11.5–15.5)
WBC: 13.2 10*3/uL — ABNORMAL HIGH (ref 4.0–10.5)
nRBC: 0 % (ref 0.0–0.2)

## 2020-04-09 LAB — CULTURE, BLOOD (ROUTINE X 2)
Culture: NO GROWTH
Culture: NO GROWTH
Special Requests: ADEQUATE
Special Requests: ADEQUATE

## 2020-04-09 LAB — COMPREHENSIVE METABOLIC PANEL
ALT: 25 U/L (ref 0–44)
AST: 29 U/L (ref 15–41)
Albumin: 2.1 g/dL — ABNORMAL LOW (ref 3.5–5.0)
Alkaline Phosphatase: 65 U/L (ref 38–126)
Anion gap: 13 (ref 5–15)
BUN: 22 mg/dL (ref 8–23)
CO2: 22 mmol/L (ref 22–32)
Calcium: 6.9 mg/dL — ABNORMAL LOW (ref 8.9–10.3)
Chloride: 102 mmol/L (ref 98–111)
Creatinine, Ser: 1.09 mg/dL (ref 0.61–1.24)
GFR calc Af Amer: >60 mL/min (ref >60)
GFR calc non Af Amer: >60 mL/min (ref >60)
Glucose, Bld: 93 mg/dL (ref 70–99)
Potassium: 3.5 mmol/L (ref 3.5–5.1)
Sodium: 137 mmol/L (ref 135–145)
Total Bilirubin: 0.7 mg/dL (ref 0.3–1.2)
Total Protein: 6 g/dL — ABNORMAL LOW (ref 6.5–8.1)

## 2020-04-09 LAB — GLUCOSE, CAPILLARY
Glucose-Capillary: 93 mg/dL (ref 70–99)
Glucose-Capillary: 178 mg/dL — ABNORMAL HIGH (ref 70–99)
Glucose-Capillary: 158 mg/dL — ABNORMAL HIGH (ref 70–99)
Glucose-Capillary: 159 mg/dL — ABNORMAL HIGH (ref 70–99)

## 2020-04-09 NOTE — Care Management Important Message (Signed)
Important Message  Patient Details  Name: Stephen Paul MRN: [id number] Date of Birth: [birth year]   Medicare Important Message Given:  Yes  Reviewed via phone with spouse.  Copy of Medicare IM to be sent to home address on file.    Johnell Comings 04/09/2020, 9:32 AM

## 2020-04-09 NOTE — Progress Notes (Addendum)
Patient is more alert and oriented at this time. Gave PRN Tylenol for his back pain. Will reassess effectiveness. Patient maintaining stats with non-rebreather and HHFNC 55L at 50%

## 2020-04-09 NOTE — Progress Notes (Addendum)
Patient refused medications this morning. Says he is ready to go home and pay his bills. Says wife is at home. I was able to give patient Levemir. Oxygen saturation currently 90% on 50L at 100%. Patient seems disoriented slightly this morning. Night shift patient was AO4 overnight. PT at bedside currently working with patient.

## 2020-04-09 NOTE — Evaluation (Signed)
Physical Therapy Evaluation Patient Details Name: Stephen Paul MRN: [id number] DOB: 1931-08-05 Today's Date: 04/09/2020   History of Present Illness  Stephen Paul is an 60yoM who comes to [facility] on 9/8 c SOB. Pt tested positive 1 week prior, but had no symptoms until recently. PMH: DM, HTN, (+) COVID test ~1 week prior to arrival. Pt moved to 55L heated highflow on 9/13.  Clinical Impression  Pt admitted with above diagnosis. Pt currently with functional limitations due to the deficits listed below (see "PT Problem List"). Upon entry, pt in bed, awake and conversing with RN. The pt is alert and oriented to self, pleasant, conversational, but anxious and perseverating over going home right now to pay bills. Unable to redirect patient to answer orientation questions. Pt follows simple commands for strength testing, appears generally strong, even able to reposition in bed fairly well, however he desaturates frequently in session to mid 80s%. Pt on 55L heated high flow, with NRB around neck for PRN needs due to mouth breathing tendencies, however he does not apply ad lib, requires cues and full assist. Pt has poor insight into condition, keeps saying 'everything will be fine once I get home.' Once respiratory status is more stable and mentation is improved, will perform OOB assessment. Pt will benefit from skilled PT intervention to increase independence and safety with basic mobility in preparation for discharge to the venue listed below.       Follow Up Recommendations LTACH    Equipment Recommendations       Recommendations for Other Services       Precautions / Restrictions Precautions Precautions: Fall Restrictions Weight Bearing Restrictions: No      Mobility  Bed Mobility Overal bed mobility: Modified Independent             General bed mobility comments: minimally tolerated, frequent desaturation; needs help monitoring lines/leads for safety  Transfers                     Ambulation/Gait                Stairs            Wheelchair Mobility    Modified Rankin (Stroke Patients Only)       Balance                                             Pertinent Vitals/Pain Pain Assessment: No/denies pain    Home Living Family/patient expects to be discharged to:: Private residence Living Arrangements: Spouse/significant other                    Prior Function                 Hand Dominance        Extremity/Trunk Assessment   Upper Extremity Assessment Upper Extremity Assessment: Generalized weakness    Lower Extremity Assessment Lower Extremity Assessment: Generalized weakness       Communication      Cognition Arousal/Alertness: Lethargic Behavior During Therapy: Restless;Impulsive Overall Cognitive Status: No family/caregiver present to determine baseline cognitive functioning Area of Impairment: Orientation;Attention;Memory                 Orientation Level: Time;Situation Current Attention Level: Alternating;Divided Memory: Decreased recall of precautions;Decreased short-term memory  General Comments      Exercises     Assessment/Plan    PT Assessment Patient needs continued PT services  PT Problem List Decreased activity tolerance;Decreased knowledge of use of DME;Decreased balance;Decreased knowledge of precautions;Decreased safety awareness;Cardiopulmonary status limiting activity       PT Treatment Interventions DME instruction;Gait training;Stair training;Functional mobility training;Therapeutic activities;Therapeutic exercise;Balance training    PT Goals (Current goals can be found in the Care Plan section)  Acute Rehab PT Goals PT Goal Formulation: Patient unable to participate in goal setting Time For Goal Achievement: 04/23/20 Potential to Achieve Goals: Poor    Frequency Min 2X/week   Barriers to discharge         Co-evaluation               AM-PAC PT "6 Clicks" Mobility  Outcome Measure Help needed turning from your back to your side while in a flat bed without using bedrails?: A Little Help needed moving from lying on your back to sitting on the side of a flat bed without using bedrails?: A Little Help needed moving to and from a bed to a chair (including a wheelchair)?: Total Help needed standing up from a chair using your arms (e.g., wheelchair or bedside chair)?: Total Help needed to walk in hospital room?: Total Help needed climbing 3-5 steps with a railing? : Total 6 Click Score: 10    End of Session Equipment Utilized During Treatment: Oxygen Activity Tolerance: Treatment limited secondary to medical complications (Comment) Patient left: in bed;with call bell/phone within reach;with bed alarm set Nurse Communication: Mobility status PT Visit Diagnosis: Other abnormalities of gait and mobility (R26.89)    Time: 8453-6468 PT Time Calculation (min) (ACUTE ONLY): 25 min   Charges:   PT Evaluation $PT Eval Moderate Complexity: 1 Mod          12:04 PM, 04/09/20 Rosamaria Lints, PT, DPT Physical Therapist - [facility]  [phone] (ASCOM)    Lory Galan C 04/09/2020, 11:53 AM

## 2020-04-09 NOTE — Progress Notes (Signed)
PROGRESS NOTE    Stephen Paul  YCX:[id number] DOB: Jun 14, 1932 DOA: 04/03/2020 PCP: Barbette Reichmann, MD   Brief Narrative: Taken from H&P Stephen Paul is a 86 y.o. male with medical history significant of diabetes and hypertension who apparently was told about a week ago that he was positive for COVID-19.  Came to ED with worsening shortness of breath.  Found to be hypoxic in 70s.  Chest x-ray consistent with COVID-19 pneumonia.  CTA negative. Patient is unvaccinated.  Subjective: Patient was feeling very tired.  Per nursing concerned that he was refusing p.o. meds.  Able to eat some from his breakfast tray.  Assessment & Plan:   Principal Problem:   Acute respiratory failure due to COVID-19 [facility]) Active Problems:   COPD with acute exacerbation (HCC)   Essential hypertension   Diabetes (HCC)   Goals of care, counseling/discussion   Palliative care by specialist   DNR (do not resuscitate)  Acute hypoxic respiratory failure secondary to COVID-19 pneumonia/sepsis secondary to COVID-19 infection. Patient did meet sepsis criteria on presentation with being tachycardic, tachypneic, endorgan damage with lactic acidosis, AKI, elevated T bili and COVID-19 infection.  Patient did not receive any fluid on admission.  No cultures obtained.  Procalcitonin elevated at 0.51.  Started improving elevated inflammatory markers.  UA was positive for ketones but not very impressive for any infection.  Blood cultures negative. Completed the course of remdesivir, baricitinib was given initially and discontinued due to worsening leukocytosis. D-dimer and CRP started improving. Completed the course of ceftriaxone and Zithromax. Continue to require high levels of oxygen, he was on maximum setting on heated high flow along with nonrebreather today.  Repeat chest x-ray with worsening opacities. -Continue Solu-Medrol- day 7 -Continue supplemental oxygen with heated HFNC to keep saturation above  88%. -Continue supportive care. -Continue supplements. -Continue to monitor inflammatory markers. -Palliative care consult to discuss the goals of care. -CODE STATUS changed to DNR.  Non anion gap metabolic acidosis/lactic acidosis.  Resolved.  Lactic acid remained elevated.  CK within normal limit.  No history of liver disease, liver functions normal.  Patient is hypoxic and lethargic secondary to COVID-19 pneumonia. -Switch bicarb infusion with LR- very poor PO intake. -Continue to monitor  Anemia.  Hemoglobin at 10.3.  Anemia panel ordered.  Initial results with some iron deficiency along with low TIBC and saturation, more consistent with anemia of chronic disease with some iron deficiency.  Ferritin within normal range. -Can try some iron and see if that will help.  Type 2 diabetes mellitus.  Patient to have ketones in his urine most likely secondary to poor p.o. intake.  No anion gap.  A1c of 7.1.  CBG elevated with some improvement after increasing Levemir, as patient is on steroid.  Patient was on Metformin and glipizide at home. -Increase Levemir to 15 units twice daily. -Continue 4 units with meals if you eat more than 50%. -Continue moderate scale sliding scale. -Continue to monitor.  Elevated troponin.  Mildly elevated troponin with a flat curve at 19. Most likely secondary to demand as there is no chest pain.  Hypertension.  Blood pressure mildly elevated.  Patient was not on any antihypertensives at home. -Increase amlodipine to 10 mg daily. -Continue as needed hydralazine.  AKI.  Creatinine with some worsening, it was 1.28 this morning with baseline around 1. -Continue gentle IV hydration -Continue to monitor -Avoid nephrotoxins.  History of DVT.  Was on Eliquis at home due to history of DVT in  February 2021.  CTA negative for acute PE. -Continue home dose of Eliquis.  Objective: Vitals:   04/09/20 0739 04/09/20 0800 04/09/20 1100 04/09/20 1241  BP:  (!) 152/88   (!) 149/91  Pulse:  73  86  Resp:  19  (!) 21  Temp: 97.7 F (36.5 C)   97.8 F (36.6 C)  TempSrc: Oral   Oral  SpO2:  97% 90% 96%  Weight:      Height:        Intake/Output Summary (Last 24 hours) at 04/09/2020 1511 Last data filed at 04/09/2020 1505 Gross per 24 hour  Intake 1249.2 ml  Output 1250 ml  Net -0.8 ml   Filed Weights   04/03/20 1526 04/09/20 0603  Weight: 68 kg 82.9 kg    Examination:  General.  Lethargic elderly man, in no acute distress. Pulmonary.  Lungs clear bilaterally, normal respiratory effort. CV.  Regular rate and rhythm, no JVD, rub or murmur. Abdomen.  Soft, nontender, nondistended, BS positive. CNS.  Alert and oriented x1.  No focal neurologic deficit. Extremities.  No edema, no cyanosis, pulses intact and symmetrical. Psychiatry.  Judgment and insight appears impaired.  DVT prophylaxis: Eliquis Code Status: Full Family Communication: Wife was updated at phone. Disposition Plan:  Status is: Inpatient  Remains inpatient appropriate because:Inpatient level of care appropriate due to severity of illness   Dispo: The patient is from: Home              Anticipated d/c is to: Home              Anticipated d/c date is: 3 days              Patient currently is not medically stable to d/c.  Patient is very high risk for deterioration and death.   Palliative care was consulted and CODE STATUS was changed to DNR after discussing with wife.  Consultants:   Pulmonary  Procedures:  Antimicrobials:   Data Reviewed: I have personally reviewed following labs and imaging studies  CBC: Recent Labs  Lab 04/04/20 0436 04/04/20 0436 04/05/20 0329 04/06/20 0552 04/07/20 0522 04/08/20 0518 04/09/20 0710  WBC 2.3*   < > 7.5 14.3* 11.4* 12.9* 13.2*  NEUTROABS 1.7  --  6.2 12.3* 9.6* 11.2*  --   HGB 11.3*   < > 11.6* 10.6* 10.3* 10.4* 10.3*  HCT 34.7*   < > 34.3* 31.9* 30.0* 31.3* 31.3*  MCV 66.0*   < > 63.4* 64.1* 64.0* 64.8* 65.1*  PLT 282   <  > 362 349 341 363 379   < > = values in this interval not displayed.   Basic Metabolic Panel: Recent Labs  Lab 04/04/20 0436 04/04/20 0436 04/05/20 0329 04/06/20 0552 04/07/20 0522 04/08/20 0518 04/09/20 0710  NA 139   < > 139 142 141 141 137  K 4.9   < > 5.3* 3.4* 3.4* 3.9 3.5  CL 110   < > 110 107 104 99 102  CO2 15*   < > 17* 21* 25 29 22   GLUCOSE 255*   < > 270* 208* 179* 89 93  BUN 21   < > 31* 29* 29* 25* 22  CREATININE 1.49*   < > 1.31* 1.19 1.07 1.28* 1.09  CALCIUM 7.8*   < > 7.8* 7.5* 7.2* 6.9* 6.9*  MG 2.2  --  2.3 2.3 2.1 2.2  --   PHOS 3.3  --  3.0 2.8 2.8 3.8  --    < > =  values in this interval not displayed.   GFR: Estimated Creatinine Clearance: 49.1 mL/min (by C-G formula based on SCr of 1.09 mg/dL). Liver Function Tests: Recent Labs  Lab 04/05/20 0329 04/06/20 0552 04/07/20 0522 04/08/20 0518 04/09/20 0710  AST 75* 46* 37 39 29  ALT 56* 44 34 30 25  ALKPHOS 61 59 59 65 65  BILITOT 1.1 0.9 0.8 0.9 0.7  PROT 7.4 6.2* 5.9* 5.9* 6.0*  ALBUMIN 2.4* 2.2* 2.0* 2.2* 2.1*   No results for input(s): LIPASE, AMYLASE in the last 168 hours. No results for input(s): AMMONIA in the last 168 hours. Coagulation Profile: No results for input(s): INR, PROTIME in the last 168 hours. Cardiac Enzymes: Recent Labs  Lab 04/06/20 0552  CKTOTAL 225   BNP (last 3 results) No results for input(s): PROBNP in the last 8760 hours. HbA1C: No results for input(s): HGBA1C in the last 72 hours. CBG: Recent Labs  Lab 04/08/20 1714 04/08/20 2043 04/08/20 2054 04/09/20 0814 04/09/20 1243  GLUCAP 174* 184* 167* 93 178*   Lipid Profile: No results for input(s): CHOL, HDL, LDLCALC, TRIG, CHOLHDL, LDLDIRECT in the last 72 hours. Thyroid Function Tests: No results for input(s): TSH, T4TOTAL, FREET4, T3FREE, THYROIDAB in the last 72 hours. Anemia Panel: Recent Labs    04/07/20 0522 04/07/20 1025 04/08/20 0518  VITAMINB12  --  3,862*  --   FOLATE 8.5  --   --    FERRITIN 164  175  --  143  TIBC 183*  --   --   IRON 22*  --   --   RETICCTPCT 0.6  --   --    Sepsis Labs: Recent Labs  Lab 04/03/20 2215 04/04/20 0856 04/04/20 1120 04/05/20 0354 04/06/20 0552 04/07/20 0522 04/07/20 1025 04/08/20 0518  PROCALCITON 0.51  --   --   --  0.18 <0.10  --  <0.10  LATICACIDVEN  --    < > 3.1* 3.2* 3.3*  --  4.0*  --    < > = values in this interval not displayed.    Recent Results (from the past 240 hour(s))  SARS Coronavirus 2 by RT PCR (hospital order, performed in [facility] hospital lab) Nasopharyngeal Nasopharyngeal Swab     Status: Abnormal   Collection Time: 04/03/20  3:30 PM   Specimen: Nasopharyngeal Swab  Result Value Ref Range Status   SARS Coronavirus 2 POSITIVE (A) NEGATIVE Final    Comment: RESULT CALLED TO, READ BACK BY AND VERIFIED WITH: SAMANTHA HAMILTON 04/03/20 AT 1748 BY ACR (NOTE) SARS-CoV-2 target nucleic acids are DETECTED  SARS-CoV-2 RNA is generally detectable in upper respiratory specimens  during the acute phase of infection.  Positive results are indicative  of the presence of the identified virus, but do not rule out bacterial infection or co-infection with other pathogens not detected by the test.  Clinical correlation with patient history and  other diagnostic information is necessary to determine patient infection status.  The expected result is negative.  Fact Sheet for Patients:   BoilerBrush.com.cy   Fact Sheet for Healthcare Providers:   https://pope.com/    This test is not yet approved or cleared by the [place] FDA and  has been authorized for detection and/or diagnosis of SARS-CoV-2 by FDA under an Emergency Use Authorization (EUA).  This EUA will remain in effect (meaning th is test can be used) for the duration of  the COVID-19 declaration under Section 564(b)(1) of the Act, 21 U.S.C. section 360-bbb-3(b)(1), unless  the authorization  is terminated or revoked sooner.  Performed at [facility], [address] [place], [place] [address]   CULTURE, BLOOD (ROUTINE X 2) w Reflex to ID Panel     Status: None   Collection Time: 04/04/20 11:19 AM   Specimen: BLOOD  Result Value Ref Range Status   Specimen Description BLOOD L ARM  Final   Special Requests   Final    BOTTLES DRAWN AEROBIC AND ANAEROBIC Blood Culture adequate volume   Culture   Final    NO GROWTH 5 DAYS Performed at [facility], [address] [place], [place] [address]    Report Status 04/09/2020 FINAL  Final  CULTURE, BLOOD (ROUTINE X 2) w Reflex to ID Panel     Status: None   Collection Time: 04/04/20 11:22 AM   Specimen: BLOOD  Result Value Ref Range Status   Specimen Description BLOOD L HAND  Final   Special Requests   Final    BOTTLES DRAWN AEROBIC AND ANAEROBIC Blood Culture adequate volume   Culture   Final    NO GROWTH 5 DAYS Performed at [facility], [address] [place], [place] [address]    Report Status 04/09/2020 FINAL  Final  MRSA PCR Screening     Status: None   Collection Time: 04/05/20 11:57 AM   Specimen: Nasal Mucosa; Nasopharyngeal  Result Value Ref Range Status   MRSA by PCR NEGATIVE NEGATIVE Final    Comment:        The GeneXpert MRSA Assay (FDA approved for NASAL specimens only), is one component of a comprehensive MRSA colonization surveillance program. It is not intended to diagnose MRSA infection nor to guide or monitor treatment for MRSA infections. Performed at [facility], [address] [place], [place] [address]      [facility] [place]  Result Date: 04/08/2020 CLINICAL DATA:  COVID-19 positive.  Shortness of breath. EXAM: PORTABLE CHEST 1 VIEW COMPARISON:  April 03, 2020. FINDINGS: The heart size and mediastinal contours are within normal limits. No pneumothorax or pleural effusion is noted. Increased patchy airspace opacities are  noted throughout both lungs concerning for multifocal pneumonia. The visualized skeletal structures are unremarkable. IMPRESSION: Increased patchy airspace opacities are noted throughout both lungs concerning for multifocal pneumonia. Electronically Signed   By: Lupita RaiderJames  Green Jr M.D.   On: 04/08/2020 14:26    Scheduled Meds: . apixaban  5 mg Oral BID  . Chlorhexidine Gluconate Cloth  6 each Topical Daily  . ferrous fumarate-b12-vitamic C-folic acid  1 capsule Oral BID PC  . insulin aspart  0-15 Units Subcutaneous TID WC  . insulin aspart  0-5 Units Subcutaneous QHS  . insulin detemir  15 Units Subcutaneous BID  . Ipratropium-Albuterol  1 puff Inhalation Q6H  . linagliptin  5 mg Oral Daily  . mouth rinse  15 mL Mouth Rinse BID  . methylPREDNISolone (SOLU-MEDROL) injection  40 mg Intravenous Q12H  . multivitamin with minerals  1 tablet Oral Daily  . thiamine  100 mg Oral Daily   Continuous Infusions: . lactated ringers 50 mL/hr at 04/09/20 1505     LOS: 6 days   Time spent: 30 minutes  Arnetha CourserSumayya Nicey Krah, MD [facility]  If 7PM-7AM, please contact night-coverage [email or web address]  04/09/2020, 3:11 PM   This record has been created using Conservation officer, historic buildingsDragon voice recognition software. Errors have been sought and corrected,but may not always be located. Such creation errors do not reflect  on the standard of care.

## 2020-04-10 LAB — GLUCOSE, CAPILLARY
Glucose-Capillary: 54 mg/dL — ABNORMAL LOW (ref 70–99)
Glucose-Capillary: 72 mg/dL (ref 70–99)
Glucose-Capillary: 250 mg/dL — ABNORMAL HIGH (ref 70–99)
Glucose-Capillary: 354 mg/dL — ABNORMAL HIGH (ref 70–99)
Glucose-Capillary: 326 mg/dL — ABNORMAL HIGH (ref 70–99)

## 2020-04-10 MED ORDER — INSULIN DETEMIR 100 UNIT/ML ~~LOC~~ SOLN
10.0000 [IU] | Freq: Two times a day (BID) | SUBCUTANEOUS | Status: DC
  Administered 2020-04-10 – 2020-04-14 (×9): 10 [IU] via SUBCUTANEOUS
  Filled 2020-04-10 (×11): qty 0.1

## 2020-04-10 NOTE — Progress Notes (Signed)
pts FSBS 54 - orange juice given - FSBS improved to 72 - pt alert and eating breakfast- levemir and tradjenta held/ MD made aware/ will monitor.

## 2020-04-10 NOTE — Progress Notes (Signed)
Inpatient Diabetes Program Recommendations  AACE/ADA: New Consensus Statement on Inpatient Glycemic Control (2015)  Target Ranges:  Prepandial:   less than 140 mg/dL      Peak postprandial:   less than 180 mg/dL (1-2 hours)      Critically ill patients:  140 - 180 mg/dL   Lab Results  Component Value Date   GLUCAP 250 (H) 04/10/2020   HGBA1C 7.1 (H) 04/03/2020    Review of Glycemic Control  Diabetes history:Type 2 DM Outpatient Diabetes medications:Glipizide 10 mg QD, Metformin 1000 mg BID Current orders for Inpatient glycemic control:Novolog 0-15 units TID, Novolog 0-5 units QHS, Levemir 15 units BID, Tradjenta 5 mg QD  Inpatient Diabetes Program Recommendations:   Patient did not receive am dose of Levemir to hypoglycemia of 54. -Decrease Levemir to 10 units bid Secure chat sent to Dr. Nelson Chimes.  Thank you, Stephen Paul. Stephen Janoski, RN, MSN, CDE  Diabetes Coordinator Inpatient Glycemic Control Team Team Pager [phone] (8am-5pm) 04/10/2020 1:59 PM

## 2020-04-10 NOTE — Progress Notes (Signed)
PROGRESS NOTE    Stephen Paul  UJW:[id number] DOB: 06/11/1932 DOA: 04/03/2020 PCP: Barbette ReichmannHande, Vishwanath, MD   Brief Narrative: Taken from H&P Stephen Paul is a 86 y.o. male with medical history significant of diabetes and hypertension who apparently was told about a week ago that he was positive for COVID-19.  Came to ED with worsening shortness of breath.  Found to be hypoxic in 70s.  Chest x-ray consistent with COVID-19 pneumonia.  CTA negative. Patient is unvaccinated.  Subjective: Patient was sleeping peacefully when I entered the room.  Oxygen cannula was on one side of his face.  He was saturating 93%.  Did not disturb the patient, chest was clear.  Asked nursing staff to monitor saturation, as we might be able to wean.  Assessment & Plan:   Principal Problem:   Acute respiratory failure due to COVID-19 [facility](HCC) Active Problems:   COPD with acute exacerbation (HCC)   Essential hypertension   Diabetes (HCC)   Goals of care, counseling/discussion   Palliative care by specialist   DNR (do not resuscitate)  Acute hypoxic respiratory failure secondary to COVID-19 pneumonia/sepsis secondary to COVID-19 infection. Patient did meet sepsis criteria on presentation with being tachycardic, tachypneic, endorgan damage with lactic acidosis, AKI, elevated T bili and COVID-19 infection.  Patient did not receive any fluid on admission.  No cultures obtained.  Procalcitonin elevated at 0.51. UA was positive for ketones but not very impressive for any infection.  Blood cultures negative. Completed the course of remdesivir, baricitinib was given initially and discontinued due to worsening leukocytosis. D-dimer and CRP started improving. Completed the course of ceftriaxone and Zithromax. Continue to require high levels of oxygen,  Repeat chest x-ray with worsening opacities.  He was maintaining saturation without nasal cannula on his nose while sleeping today, might be an improvement. -Continue  Solu-Medrol- day 7 -Continue supplemental oxygen with heated HFNC to keep saturation above 88%. -Continue supportive care. -Continue supplements. -Palliative care consult to discuss the goals of care. -CODE STATUS changed to DNR-we will maintain current level of care.  Non anion gap metabolic acidosis/lactic acidosis.  Resolved.  Lactic acid remained elevated.  CK within normal limit.  No history of liver disease, liver functions normal.  Patient is hypoxic and lethargic secondary to COVID-19 pneumonia. -Continue LR at 50cc/h-poor p.o. intake. -Continue to monitor  Anemia.  Hemoglobin at 10.3.  Anemia panel results with some iron deficiency along with low TIBC and saturation, more consistent with anemia of chronic disease with some iron deficiency.  Ferritin within normal range. -Can try some iron and see if that will help.  Type 2 diabetes mellitus.  Patient to have ketones in his urine most likely secondary to poor p.o. intake.  No anion gap.  A1c of 7.1.  Patient was on Metformin and glipizide at home.  Had 1 episode of hypoglycemia today. -Decrease Levemir to 10 units twice daily. -Continue 4 units with meals if you eat more than 50%. -Continue moderate scale sliding scale. -Continue to monitor.  Elevated troponin.  Mildly elevated troponin with a flat curve at 19. Most likely secondary to demand as there is no chest pain.  Hypertension.  Blood pressure within goal today. -Continue amlodipine to 10 mg daily. -Continue as needed hydralazine.  AKI.  Resolved with gentle IV hydration. -Continue gentle IV hydration -Continue to monitor -Avoid nephrotoxins.  History of DVT.  Was on Eliquis at home due to history of DVT in February 2021.  CTA negative for acute  PE. -Continue home dose of Eliquis.  Objective: Vitals:   04/10/20 0825 04/10/20 0830 04/10/20 0835 04/10/20 0840  BP:    (!) 158/100  Pulse: 68 69 71 81  Resp: 18 19 (!) 26 (!) 23  Temp:    97.6 F (36.4 C)  TempSrc:     Axillary  SpO2: 97% 95% 96% 96%  Weight:      Height:        Intake/Output Summary (Last 24 hours) at 04/10/2020 0843 Last data filed at 04/10/2020 0521 Gross per 24 hour  Intake 1249.2 ml  Output 1350 ml  Net -100.8 ml   Filed Weights   04/03/20 1526 04/09/20 0603 04/10/20 0300  Weight: 68 kg 82.9 kg 81.7 kg    Examination:  General.  Chronically ill-appearing elderly man, in no acute distress. Pulmonary.  Lungs clear bilaterally, normal respiratory effort. CV.  Regular rate and rhythm, no JVD, rub or murmur. Abdomen.  Soft, nontender, nondistended, BS positive. CNS.  Alert and oriented x3.  No focal neurologic deficit. Extremities.  No edema, no cyanosis, pulses intact and symmetrical. Psychiatry.  Judgment and insight appears normal.  DVT prophylaxis: Eliquis Code Status: Full Family Communication: Wife was updated at phone. Disposition Plan:  Status is: Inpatient  Remains inpatient appropriate because:Inpatient level of care appropriate due to severity of illness   Dispo: The patient is from: Home              Anticipated d/c is to: Home              Anticipated d/c date is: 3 days              Patient currently is not medically stable to d/c.  Patient is very high risk for deterioration and death.   Palliative care was consulted and CODE STATUS was changed to DNR after discussing with wife.  Consultants:   Pulmonary  Procedures:  Antimicrobials:   Data Reviewed: I have personally reviewed following labs and imaging studies  CBC: Recent Labs  Lab 04/04/20 0436 04/04/20 0436 04/05/20 0329 04/06/20 0552 04/07/20 0522 04/08/20 0518 04/09/20 0710  WBC 2.3*   < > 7.5 14.3* 11.4* 12.9* 13.2*  NEUTROABS 1.7  --  6.2 12.3* 9.6* 11.2*  --   HGB 11.3*   < > 11.6* 10.6* 10.3* 10.4* 10.3*  HCT 34.7*   < > 34.3* 31.9* 30.0* 31.3* 31.3*  MCV 66.0*   < > 63.4* 64.1* 64.0* 64.8* 65.1*  PLT 282   < > 362 349 341 363 379   < > = values in this interval not  displayed.   Basic Metabolic Panel: Recent Labs  Lab 04/04/20 0436 04/04/20 0436 04/05/20 0329 04/06/20 0552 04/07/20 0522 04/08/20 0518 04/09/20 0710  NA 139   < > 139 142 141 141 137  K 4.9   < > 5.3* 3.4* 3.4* 3.9 3.5  CL 110   < > 110 107 104 99 102  CO2 15*   < > 17* 21* 25 29 22   GLUCOSE 255*   < > 270* 208* 179* 89 93  BUN 21   < > 31* 29* 29* 25* 22  CREATININE 1.49*   < > 1.31* 1.19 1.07 1.28* 1.09  CALCIUM 7.8*   < > 7.8* 7.5* 7.2* 6.9* 6.9*  MG 2.2  --  2.3 2.3 2.1 2.2  --   PHOS 3.3  --  3.0 2.8 2.8 3.8  --    < > =  values in this interval not displayed.   GFR: Estimated Creatinine Clearance: 48.9 mL/min (by C-G formula based on SCr of 1.09 mg/dL). Liver Function Tests: Recent Labs  Lab 04/05/20 0329 04/06/20 0552 04/07/20 0522 04/08/20 0518 04/09/20 0710  AST 75* 46* 37 39 29  ALT 56* 44 34 30 25  ALKPHOS 61 59 59 65 65  BILITOT 1.1 0.9 0.8 0.9 0.7  PROT 7.4 6.2* 5.9* 5.9* 6.0*  ALBUMIN 2.4* 2.2* 2.0* 2.2* 2.1*   No results for input(s): LIPASE, AMYLASE in the last 168 hours. No results for input(s): AMMONIA in the last 168 hours. Coagulation Profile: No results for input(s): INR, PROTIME in the last 168 hours. Cardiac Enzymes: Recent Labs  Lab 04/06/20 0552  CKTOTAL 225   BNP (last 3 results) No results for input(s): PROBNP in the last 8760 hours. HbA1C: No results for input(s): HGBA1C in the last 72 hours. CBG: Recent Labs  Lab 04/09/20 0814 04/09/20 1243 04/09/20 1658 04/09/20 2048 04/10/20 0840  GLUCAP 93 178* 158* 159* 54*   Lipid Profile: No results for input(s): CHOL, HDL, LDLCALC, TRIG, CHOLHDL, LDLDIRECT in the last 72 hours. Thyroid Function Tests: No results for input(s): TSH, T4TOTAL, FREET4, T3FREE, THYROIDAB in the last 72 hours. Anemia Panel: Recent Labs    04/07/20 1025 04/08/20 0518  VITAMINB12 3,862*  --   FERRITIN  --  143   Sepsis Labs: Recent Labs  Lab 04/03/20 2215 04/04/20 0856 04/04/20 1120  04/05/20 0354 04/06/20 0552 04/07/20 0522 04/07/20 1025 04/08/20 0518  PROCALCITON 0.51  --   --   --  0.18 <0.10  --  <0.10  LATICACIDVEN  --    < > 3.1* 3.2* 3.3*  --  4.0*  --    < > = values in this interval not displayed.    Recent Results (from the past 240 hour(s))  SARS Coronavirus 2 by RT PCR (hospital order, performed in [facility] hospital lab) Nasopharyngeal Nasopharyngeal Swab     Status: Abnormal   Collection Time: 04/03/20  3:30 PM   Specimen: Nasopharyngeal Swab  Result Value Ref Range Status   SARS Coronavirus 2 POSITIVE (A) NEGATIVE Final    Comment: RESULT CALLED TO, READ BACK BY AND VERIFIED WITH: SAMANTHA HAMILTON 04/03/20 AT 1748 BY ACR (NOTE) SARS-CoV-2 target nucleic acids are DETECTED  SARS-CoV-2 RNA is generally detectable in upper respiratory specimens  during the acute phase of infection.  Positive results are indicative  of the presence of the identified virus, but do not rule out bacterial infection or co-infection with other pathogens not detected by the test.  Clinical correlation with patient history and  other diagnostic information is necessary to determine patient infection status.  The expected result is negative.  Fact Sheet for Patients:   BoilerBrush.com.cy   Fact Sheet for Healthcare Providers:   https://pope.com/    This test is not yet approved or cleared by the [place] FDA and  has been authorized for detection and/or diagnosis of SARS-CoV-2 by FDA under an Emergency Use Authorization (EUA).  This EUA will remain in effect (meaning th is test can be used) for the duration of  the COVID-19 declaration under Section 564(b)(1) of the Act, 21 U.S.C. section 360-bbb-3(b)(1), unless the authorization is terminated or revoked sooner.  Performed at [facility], [address] [place], [place] [address]   CULTURE, BLOOD (ROUTINE X 2) w Reflex to ID Panel     Status: None    Collection Time: 04/04/20  11:19 AM   Specimen: BLOOD  Result Value Ref Range Status   Specimen Description BLOOD L ARM  Final   Special Requests   Final    BOTTLES DRAWN AEROBIC AND ANAEROBIC Blood Culture adequate volume   Culture   Final    NO GROWTH 5 DAYS Performed at [facility], [address] [place], [place] [address]    Report Status 04/09/2020 FINAL  Final  CULTURE, BLOOD (ROUTINE X 2) w Reflex to ID Panel     Status: None   Collection Time: 04/04/20 11:22 AM   Specimen: BLOOD  Result Value Ref Range Status   Specimen Description BLOOD L HAND  Final   Special Requests   Final    BOTTLES DRAWN AEROBIC AND ANAEROBIC Blood Culture adequate volume   Culture   Final    NO GROWTH 5 DAYS Performed at [facility], [address] [place], [place] [address]    Report Status 04/09/2020 FINAL  Final  MRSA PCR Screening     Status: None   Collection Time: 04/05/20 11:57 AM   Specimen: Nasal Mucosa; Nasopharyngeal  Result Value Ref Range Status   MRSA by PCR NEGATIVE NEGATIVE Final    Comment:        The GeneXpert MRSA Assay (FDA approved for NASAL specimens only), is one component of a comprehensive MRSA colonization surveillance program. It is not intended to diagnose MRSA infection nor to guide or monitor treatment for MRSA infections. Performed at [facility], [address] [place], [place] [address]      Radiology Studies: [facility] Chest [place] 1 View  Result Date: 04/08/2020 CLINICAL DATA:  COVID-19 positive.  Shortness of breath. EXAM: PORTABLE CHEST 1 VIEW COMPARISON:  April 03, 2020. FINDINGS: The heart size and mediastinal contours are within normal limits. No pneumothorax or pleural effusion is noted. Increased patchy airspace opacities are noted throughout both lungs concerning for multifocal pneumonia. The visualized skeletal structures are unremarkable. IMPRESSION: Increased patchy airspace opacities are noted throughout both  lungs concerning for multifocal pneumonia. Electronically Signed   By: Lupita Raider M.D.   On: 04/08/2020 14:26    Scheduled Meds: . apixaban  5 mg Oral BID  . Chlorhexidine Gluconate Cloth  6 each Topical Daily  . ferrous fumarate-b12-vitamic C-folic acid  1 capsule Oral BID PC  . insulin aspart  0-15 Units Subcutaneous TID WC  . insulin aspart  0-5 Units Subcutaneous QHS  . insulin detemir  15 Units Subcutaneous BID  . Ipratropium-Albuterol  1 puff Inhalation Q6H  . linagliptin  5 mg Oral Daily  . mouth rinse  15 mL Mouth Rinse BID  . methylPREDNISolone (SOLU-MEDROL) injection  40 mg Intravenous Q12H  . multivitamin with minerals  1 tablet Oral Daily  . thiamine  100 mg Oral Daily   Continuous Infusions: . lactated ringers 50 mL/hr at 04/10/20 0258     LOS: 7 days   Time spent: 30 minutes  Arnetha Courser, MD [facility]  If 7PM-7AM, please contact night-coverage [email or web address]  04/10/2020, 8:43 AM   This record has been created using Conservation officer, historic buildings. Errors have been sought and corrected,but may not always be located. Such creation errors do not reflect on the standard of care.

## 2020-04-11 ENCOUNTER — Inpatient Hospital Stay: Payer: Medicare Other

## 2020-04-11 LAB — COMPREHENSIVE METABOLIC PANEL
ALT: 27 U/L (ref 0–44)
AST: 36 U/L (ref 15–41)
Albumin: 2.1 g/dL — ABNORMAL LOW (ref 3.5–5.0)
Alkaline Phosphatase: 65 U/L (ref 38–126)
Anion gap: 9 (ref 5–15)
BUN: 19 mg/dL (ref 8–23)
CO2: 22 mmol/L (ref 22–32)
Calcium: 7.7 mg/dL — ABNORMAL LOW (ref 8.9–10.3)
Chloride: 107 mmol/L (ref 98–111)
Creatinine, Ser: 0.94 mg/dL (ref 0.61–1.24)
GFR calc Af Amer: >60 mL/min (ref >60)
GFR calc non Af Amer: >60 mL/min (ref >60)
Glucose, Bld: 130 mg/dL — ABNORMAL HIGH (ref 70–99)
Potassium: 4.5 mmol/L (ref 3.5–5.1)
Sodium: 138 mmol/L (ref 135–145)
Total Bilirubin: 0.8 mg/dL (ref 0.3–1.2)
Total Protein: 6.2 g/dL — ABNORMAL LOW (ref 6.5–8.1)

## 2020-04-11 LAB — CBC
HCT: 32.8 % — ABNORMAL LOW (ref 39.0–52.0)
Hemoglobin: 10.3 g/dL — ABNORMAL LOW (ref 13.0–17.0)
MCH: 21 pg — ABNORMAL LOW (ref 26.0–34.0)
MCHC: 31.4 g/dL (ref 30.0–36.0)
MCV: 66.8 fL — ABNORMAL LOW (ref 80.0–100.0)
Platelets: 402 10*3/uL — ABNORMAL HIGH (ref 150–400)
RBC: 4.91 MIL/uL (ref 4.22–5.81)
RDW: 20.7 % — ABNORMAL HIGH (ref 11.5–15.5)
WBC: 14.4 10*3/uL — ABNORMAL HIGH (ref 4.0–10.5)
nRBC: 0 % (ref 0.0–0.2)

## 2020-04-11 LAB — GLUCOSE, CAPILLARY
Glucose-Capillary: 109 mg/dL — ABNORMAL HIGH (ref 70–99)
Glucose-Capillary: 267 mg/dL — ABNORMAL HIGH (ref 70–99)
Glucose-Capillary: 227 mg/dL — ABNORMAL HIGH (ref 70–99)
Glucose-Capillary: 292 mg/dL — ABNORMAL HIGH (ref 70–99)

## 2020-04-11 LAB — PROCALCITONIN: Procalcitonin: <0.1 ng/mL

## 2020-04-11 LAB — FIBRIN DERIVATIVES D-DIMER (ARMC ONLY): Fibrin derivatives D-dimer (ARMC): 4020.57 ng/mL (FEU) — ABNORMAL HIGH (ref 0.00–499.00)

## 2020-04-11 LAB — C-REACTIVE PROTEIN: CRP: 4.2 mg/dL — ABNORMAL HIGH (ref <1.0)

## 2020-04-11 MED ORDER — FUROSEMIDE 10 MG/ML IJ SOLN
40.0000 mg | Freq: Once | INTRAMUSCULAR | Status: AC
  Administered 2020-04-11: 40 mg via INTRAVENOUS
  Filled 2020-04-11: qty 4

## 2020-04-11 NOTE — Progress Notes (Signed)
PROGRESS NOTE    [name]  IRJ:[id number] DOB: [birth year] DOA: 04/03/2020 PCP: Barbette Reichmann, MD   Brief Narrative: Taken from H&P Stephen Paul is a 86 y.o. male with medical history significant of diabetes and hypertension who apparently was told about a week ago that he was positive for COVID-19.  Came to ED with worsening shortness of breath.  Found to be hypoxic in 70s.  Chest x-ray consistent with COVID-19 pneumonia.  CTA negative. Patient is unvaccinated.  Subjective: We were able to take nonrebreather off.  Saturating in high 90s on 50 L with FiO2 of 80%.  He wants to go home.  Feeling weak.  Assessment & Plan:   Principal Problem:   Acute respiratory failure due to COVID-19 [facility]) Active Problems:   COPD with acute exacerbation (HCC)   Essential hypertension   Diabetes (HCC)   Goals of care, counseling/discussion   Palliative care by specialist   DNR (do not resuscitate)  Acute hypoxic respiratory failure secondary to COVID-19 pneumonia/sepsis secondary to COVID-19 infection. Patient did meet sepsis criteria on presentation with being tachycardic, tachypneic, endorgan damage with lactic acidosis, AKI, elevated T bili and COVID-19 infection.  Patient did not receive any fluid on admission.  No cultures obtained.  Procalcitonin elevated at 0.51. UA was positive for ketones but not very impressive for any infection.  Blood cultures negative. Completed the course of remdesivir, baricitinib was given initially and discontinued due to worsening leukocytosis. D-dimer and CRP started improving.  Started showing some improvement in oxygenation today. Completed the course of ceftriaxone and Zithromax. Continue to require high levels of oxygen,  Repeat chest x-ray with worsening opacities.  -Continue Solu-Medrol- day 8 -Continue supplemental oxygen with heated HFNC to keep saturation above 88%. -Continue supportive care. -Continue supplements. -Palliative care consult to  discuss the goals of care. -CODE STATUS changed to DNR-we will maintain current level of care. -PT is recommending SNF/LTAC.  Non anion gap metabolic acidosis/lactic acidosis.  Resolved.  Lactic acid remained elevated.  CK within normal limit.  No history of liver disease, liver functions normal.  Patient is hypoxic and lethargic secondary to COVID-19 pneumonia. -Continue to monitor  Anemia.  Hemoglobin at 10.3.  Anemia panel results with some iron deficiency along with low TIBC and saturation, more consistent with anemia of chronic disease with some iron deficiency.  Ferritin within normal range. -Can try some iron and see if that will help.  Type 2 diabetes mellitus.  Patient to have ketones in his urine most likely secondary to poor p.o. intake.  No anion gap.  A1c of 7.1.  Patient was on Metformin and glipizide at home.  Had 1 episode of hypoglycemia . -Continue Decreased Levemir  10 units twice daily. -Continue 4 units with meals if you eat more than 50%. -Continue moderate scale sliding scale. -Continue to monitor.  Elevated troponin.  Mildly elevated troponin with a flat curve at 19. Most likely secondary to demand as there is no chest pain.  Hypertension.  Blood pressure mildly elevated. -Continue amlodipine to 10 mg daily. -Continue as needed hydralazine.  AKI.  Resolved with gentle IV hydration. -Continue gentle IV hydration -Continue to monitor -Avoid nephrotoxins.  History of DVT.  Was on Eliquis at home due to history of DVT in February 2021.  CTA negative for acute PE. -Continue home dose of Eliquis.  Objective: Vitals:   04/10/20 1559 04/10/20 2047 04/11/20 0310 04/11/20 0801  BP: 138/85 (!) 152/86 (!) 156/82 (!) 160/89  Pulse:  88  72  Resp: 20 (!) 24 18 18   Temp: 98.3 F (36.8 C) 98.9 F (37.2 C) 98.2 F (36.8 C) 98.4 F (36.9 C)  TempSrc:  Axillary Axillary Axillary  SpO2: 93% 99% 98% 97%  Weight:      Height:        Intake/Output Summary (Last 24  hours) at 04/11/2020 0818 Last data filed at 04/11/2020 0803 Gross per 24 hour  Intake 100 ml  Output 2545 ml  Net -2445 ml   Filed Weights   04/03/20 1526 04/09/20 0603 04/10/20 0300  Weight: 68 kg 82.9 kg 81.7 kg    Examination:  General.  Ill-appearing elderly man, in no acute distress. Pulmonary.  Lungs clear bilaterally, normal respiratory effort. CV.  Regular rate and rhythm, no JVD, rub or murmur. Abdomen.  Soft, nontender, nondistended, BS positive. CNS.  Alert and oriented x3.  No focal neurologic deficit. Extremities.  No edema, no cyanosis, pulses intact and symmetrical. Psychiatry.  Judgment and insight appears normal.  DVT prophylaxis: Eliquis Code Status: Full Family Communication: Wife was updated at phone. Disposition Plan:  Status is: Inpatient  Remains inpatient appropriate because:Inpatient level of care appropriate due to severity of illness   Dispo: The patient is from: Home              Anticipated d/c is to: Home              Anticipated d/c date is: 3 days              Patient currently is not medically stable to d/c.  Patient is very high risk for deterioration and death.   Palliative care was consulted and CODE STATUS was changed to DNR after discussing with wife.  Consultants:   Pulmonary  Procedures:  Antimicrobials:   Data Reviewed: I have personally reviewed following labs and imaging studies  CBC: Recent Labs  Lab 04/05/20 0329 04/05/20 0329 04/06/20 0552 04/07/20 0522 04/08/20 0518 04/09/20 0710 04/11/20 0429  WBC 7.5   < > 14.3* 11.4* 12.9* 13.2* 14.4*  NEUTROABS 6.2  --  12.3* 9.6* 11.2*  --   --   HGB 11.6*   < > 10.6* 10.3* 10.4* 10.3* 10.3*  HCT 34.3*   < > 31.9* 30.0* 31.3* 31.3* 32.8*  MCV 63.4*   < > 64.1* 64.0* 64.8* 65.1* 66.8*  PLT 362   < > 349 341 363 379 402*   < > = values in this interval not displayed.   Basic Metabolic Panel: Recent Labs  Lab 04/05/20 0329 04/05/20 0329 04/06/20 0552 04/07/20 0522  04/08/20 0518 04/09/20 0710 04/11/20 0429  NA 139   < > 142 141 141 137 138  K 5.3*   < > 3.4* 3.4* 3.9 3.5 4.5  CL 110   < > 107 104 99 102 107  CO2 17*   < > 21* 25 29 22 22   GLUCOSE 270*   < > 208* 179* 89 93 130*  BUN 31*   < > 29* 29* 25* 22 19  CREATININE 1.31*   < > 1.19 1.07 1.28* 1.09 0.94  CALCIUM 7.8*   < > 7.5* 7.2* 6.9* 6.9* 7.7*  MG 2.3  --  2.3 2.1 2.2  --   --   PHOS 3.0  --  2.8 2.8 3.8  --   --    < > = values in this interval not displayed.   GFR: Estimated Creatinine Clearance: 56.6 mL/min (by C-G  formula based on SCr of 0.94 mg/dL). Liver Function Tests: Recent Labs  Lab 04/06/20 0552 04/07/20 0522 04/08/20 0518 04/09/20 0710 04/11/20 0429  AST 46* 37 39 29 36  ALT 44 34 30 25 27   ALKPHOS 59 59 65 65 65  BILITOT 0.9 0.8 0.9 0.7 0.8  PROT 6.2* 5.9* 5.9* 6.0* 6.2*  ALBUMIN 2.2* 2.0* 2.2* 2.1* 2.1*   No results for input(s): LIPASE, AMYLASE in the last 168 hours. No results for input(s): AMMONIA in the last 168 hours. Coagulation Profile: No results for input(s): INR, PROTIME in the last 168 hours. Cardiac Enzymes: Recent Labs  Lab 04/06/20 0552  CKTOTAL 225   BNP (last 3 results) No results for input(s): PROBNP in the last 8760 hours. HbA1C: No results for input(s): HGBA1C in the last 72 hours. CBG: Recent Labs  Lab 04/10/20 0914 04/10/20 1129 04/10/20 1557 04/10/20 2046 04/11/20 0800  GLUCAP 72 250* 354* 326* 109*   Lipid Profile: No results for input(s): CHOL, HDL, LDLCALC, TRIG, CHOLHDL, LDLDIRECT in the last 72 hours. Thyroid Function Tests: No results for input(s): TSH, T4TOTAL, FREET4, T3FREE, THYROIDAB in the last 72 hours. Anemia Panel: No results for input(s): VITAMINB12, FOLATE, FERRITIN, TIBC, IRON, RETICCTPCT in the last 72 hours. Sepsis Labs: Recent Labs  Lab 04/04/20 1120 04/05/20 0354 04/06/20 0552 04/07/20 0522 04/07/20 1025 04/08/20 0518  PROCALCITON  --   --  0.18 <0.10  --  <0.10  LATICACIDVEN 3.1* 3.2*  3.3*  --  4.0*  --     Recent Results (from the past 240 hour(s))  SARS Coronavirus 2 by RT PCR (hospital order, performed in [facility] hospital lab) Nasopharyngeal Nasopharyngeal Swab     Status: Abnormal   Collection Time: 04/03/20  3:30 PM   Specimen: Nasopharyngeal Swab  Result Value Ref Range Status   SARS Coronavirus 2 POSITIVE (A) NEGATIVE Final    Comment: RESULT CALLED TO, READ BACK BY AND VERIFIED WITH: SAMANTHA HAMILTON 04/03/20 AT 1748 BY ACR (NOTE) SARS-CoV-2 target nucleic acids are DETECTED  SARS-CoV-2 RNA is generally detectable in upper respiratory specimens  during the acute phase of infection.  Positive results are indicative  of the presence of the identified virus, but do not rule out bacterial infection or co-infection with other pathogens not detected by the test.  Clinical correlation with patient history and  other diagnostic information is necessary to determine patient infection status.  The expected result is negative.  Fact Sheet for Patients:   06/03/20   Fact Sheet for Healthcare Providers:   BoilerBrush.com.cy    This test is not yet approved or cleared by the https://pope.com/ FDA and  has been authorized for detection and/or diagnosis of SARS-CoV-2 by FDA under an Emergency Use Authorization (EUA).  This EUA will remain in effect (meaning th is test can be used) for the duration of  the COVID-19 declaration under Section 564(b)(1) of the Act, 21 U.S.C. section 360-bbb-3(b)(1), unless the authorization is terminated or revoked sooner.  Performed at [facility], [address] [place], [place]   CULTURE, BLOOD (ROUTINE X 2) w Reflex to ID Panel     Status: None   Collection Time: 04/04/20 11:19 AM   Specimen: BLOOD  Result Value Ref Range Status   Specimen Description BLOOD L ARM  Final   Special Requests   Final    BOTTLES DRAWN AEROBIC AND ANAEROBIC Blood Culture  adequate volume   Culture   Final    NO  GROWTH 5 DAYS Performed at [facility], [address] [place], [place] [address]    Report Status 04/09/2020 FINAL  Final  CULTURE, BLOOD (ROUTINE X 2) w Reflex to ID Panel     Status: None   Collection Time: 04/04/20 11:22 AM   Specimen: BLOOD  Result Value Ref Range Status   Specimen Description BLOOD L HAND  Final   Special Requests   Final    BOTTLES DRAWN AEROBIC AND ANAEROBIC Blood Culture adequate volume   Culture   Final    NO GROWTH 5 DAYS Performed at [facility], [address] [place], [place] [address]    Report Status 04/09/2020 FINAL  Final  MRSA PCR Screening     Status: None   Collection Time: 04/05/20 11:57 AM   Specimen: Nasal Mucosa; Nasopharyngeal  Result Value Ref Range Status   MRSA by PCR NEGATIVE NEGATIVE Final    Comment:        The GeneXpert MRSA Assay (FDA approved for NASAL specimens only), is one component of a comprehensive MRSA colonization surveillance program. It is not intended to diagnose MRSA infection nor to guide or monitor treatment for MRSA infections. Performed at [facility], [address] [place], [place] [address]      Radiology Studies: No results found.  Scheduled Meds: . apixaban  5 mg Oral BID  . Chlorhexidine Gluconate Cloth  6 each Topical Daily  . ferrous fumarate-b12-vitamic C-folic acid  1 capsule Oral BID PC  . insulin aspart  0-15 Units Subcutaneous TID WC  . insulin aspart  0-5 Units Subcutaneous QHS  . insulin detemir  10 Units Subcutaneous BID  . Ipratropium-Albuterol  1 puff Inhalation Q6H  . linagliptin  5 mg Oral Daily  . mouth rinse  15 mL Mouth Rinse BID  . methylPREDNISolone (SOLU-MEDROL) injection  40 mg Intravenous Q12H  . multivitamin with minerals  1 tablet Oral Daily  . thiamine  100 mg Oral Daily   Continuous Infusions: . lactated ringers 50 mL/hr at 04/10/20 2149     LOS: 8 days   Time spent: 25  minutes  Arnetha Courser, MD [facility]  If 7PM-7AM, please contact night-coverage [email or web address]  04/11/2020, 8:18 AM   This record has been created using Conservation officer, historic buildings. Errors have been sought and corrected,but may not always be located. Such creation errors do not reflect on the standard of care.

## 2020-04-11 NOTE — Progress Notes (Signed)
Physical Therapy Treatment Patient Details Name: Stephen Paul MRN: [id number] DOB: 11-20-1931 Today's Date: 04/11/2020    History of Present Illness Stephen Paul is an 88yoM who comes [facility] on 9/8 c SOB. Pt tested positive 1 week prior, but had no symptoms until recently. PMH: DM, HTN, (+) COVID test ~1 week prior to arrival. Pt moved to 55L heated highflow on 9/13.    PT Comments    Pt eager to do some activity as he reports that he is normally quite active and that he has been able to do next to nothing since being admitted.  He did show good effort and age appropriate strength with basic tasks, but struggled with "prolonged" standing tasks with quickly and considerable fatigue and change in vitals.  Prolonged seated rest breaks after even modest activity to get vitals back to reasonable levels.  Interestingly, in supine his sats stayed very good and with minimal c/o fatigue during repeated resisted LE exercises.  Pt very weak, requiring a lot of O2, and was unsafe/unable to trial ambulation away from bed. Did some marching in place on last bout of standing with increased fatigue and uncontrolled descent sitting back to bed.  Follow Up Recommendations  Supervision/Assistance - 24 hour;LTACH;SNF (per progress)     Equipment Recommendations   (TBD at next venue of care)    Recommendations for Other Services       Precautions / Restrictions Precautions Precautions: Fall Restrictions Weight Bearing Restrictions: No    Mobility  Bed Mobility Overal bed mobility: Modified Independent             General bed mobility comments: similarly to previous PT session, desaturated with modest effort.  O2 from high 90s in supine to mid 80s with transition, focused breathing to slowly get back to 90s in sitting.   Transfers Overall transfer level: Needs assistance Equipment used: Rolling walker (2 wheeled) Transfers: Sit to/from Stand Sit to Stand: Min assist         General  transfer comment: Pt did well with standing attempts, did not need excessive assist but could not tolerate prolonged upright.  <2 minutes on first attempt, ~3 minutes on second with desaturations on HFNC + NRB and plenty of cues for proper breathing/posture/walker use.  Both times he crashed down onto bed and was clearly faigued with the effort  Ambulation/Gait             General Gait Details: Deferred ambulation 2/2 phyiscal limitations of the HFNC as well as poor pt tolerance to standing acts with vitals relatively quickly (HR up to ~120, O2 down to 80s)   Stairs             Wheelchair Mobility    Modified Rankin (Stroke Patients Only)       Balance Overall balance assessment: Needs assistance Sitting-balance support: Bilateral upper extremity supported Sitting balance-Leahy Scale: Good Sitting balance - Comments: slouched posture, unable to correct even with cuing   Standing balance support: Bilateral upper extremity supported Standing balance-Leahy Scale: Fair Standing balance comment: Pt with multiple stagger steps and need for phyiscal assist while trying to stand and hold urinal to pee.  Crashed back down onto bed after the effort.                            Cognition Arousal/Alertness: Awake/alert Behavior During Therapy: WFL for tasks assessed/performed Overall Cognitive Status: No family/caregiver present to determine baseline cognitive  functioning                                 General Comments: Pt with some mild impulsivity, but appears much more appropraite today than previous documentation      Exercises General Exercises - Lower Extremity Ankle Circles/Pumps: AROM;10 reps Heel Slides: Strengthening;10 reps (with resisted leg extensions) Hip ABduction/ADduction: Strengthening;10 reps Straight Leg Raises: AROM;10 reps    General Comments General comments (skin integrity, edema, etc.): Pt with good effort but needs  excessive O2 (HFNC + NRB) just to maintain reasonable sats with modest activity.      Pertinent Vitals/Pain      Home Living                      Prior Function            PT Goals (current goals can now be found in the care plan section) Progress towards PT goals: Progressing toward goals    Frequency    Min 2X/week      PT Plan Current plan remains appropriate    Co-evaluation              AM-PAC PT "6 Clicks" Mobility   Outcome Measure  Help needed turning from your back to your side while in a flat bed without using bedrails?: A Little Help needed moving from lying on your back to sitting on the side of a flat bed without using bedrails?: A Little Help needed moving to and from a bed to a chair (including a wheelchair)?: A Lot Help needed standing up from a chair using your arms (e.g., wheelchair or bedside chair)?: A Lot Help needed to walk in hospital room?: A Lot Help needed climbing 3-5 steps with a railing? : Total 6 Click Score: 13    End of Session Equipment Utilized During Treatment: Oxygen Activity Tolerance: Patient limited by fatigue Patient left: in bed;with call bell/phone within reach;with bed alarm set Nurse Communication: Mobility status PT Visit Diagnosis: Other abnormalities of gait and mobility (R26.89)     Time: 1440-1520 PT Time Calculation (min) (ACUTE ONLY): 40 min  Charges:  $Therapeutic Exercise: 8-22 mins $Therapeutic Activity: 23-37 mins                     Malachi Pro, DPT 04/11/2020, 4:31 PM

## 2020-04-12 LAB — GLUCOSE, CAPILLARY
Glucose-Capillary: 93 mg/dL (ref 70–99)
Glucose-Capillary: 167 mg/dL — ABNORMAL HIGH (ref 70–99)
Glucose-Capillary: 262 mg/dL — ABNORMAL HIGH (ref 70–99)
Glucose-Capillary: 173 mg/dL — ABNORMAL HIGH (ref 70–99)

## 2020-04-12 LAB — BASIC METABOLIC PANEL
Anion gap: 7 (ref 5–15)
BUN: 21 mg/dL (ref 8–23)
CO2: 24 mmol/L (ref 22–32)
Calcium: 8 mg/dL — ABNORMAL LOW (ref 8.9–10.3)
Chloride: 107 mmol/L (ref 98–111)
Creatinine, Ser: 1.01 mg/dL (ref 0.61–1.24)
GFR calc Af Amer: >60 mL/min (ref >60)
GFR calc non Af Amer: >60 mL/min (ref >60)
Glucose, Bld: 168 mg/dL — ABNORMAL HIGH (ref 70–99)
Potassium: 5.2 mmol/L — ABNORMAL HIGH (ref 3.5–5.1)
Sodium: 138 mmol/L (ref 135–145)

## 2020-04-12 LAB — CBC
HCT: 32.4 % — ABNORMAL LOW (ref 39.0–52.0)
Hemoglobin: 11 g/dL — ABNORMAL LOW (ref 13.0–17.0)
MCH: 21.8 pg — ABNORMAL LOW (ref 26.0–34.0)
MCHC: 34 g/dL (ref 30.0–36.0)
MCV: 64.2 fL — ABNORMAL LOW (ref 80.0–100.0)
Platelets: 407 10*3/uL — ABNORMAL HIGH (ref 150–400)
RBC: 5.05 MIL/uL (ref 4.22–5.81)
RDW: 21.2 % — ABNORMAL HIGH (ref 11.5–15.5)
WBC: 16.6 10*3/uL — ABNORMAL HIGH (ref 4.0–10.5)
nRBC: 0 % (ref 0.0–0.2)

## 2020-04-12 LAB — MAGNESIUM: Magnesium: 1.9 mg/dL (ref 1.7–2.4)

## 2020-04-12 MED ORDER — AMLODIPINE BESYLATE 10 MG PO TABS
10.0000 mg | ORAL_TABLET | Freq: Every day | ORAL | Status: DC
  Administered 2020-04-12 – 2020-04-24 (×13): 10 mg via ORAL
  Filled 2020-04-12 (×13): qty 1

## 2020-04-12 NOTE — Progress Notes (Signed)
PROGRESS NOTE    Stephen Paul  ZOX:[id number] DOB: 04/09/1932 DOA: 04/03/2020 PCP: Barbette ReichmannHande, Vishwanath, MD   Brief Narrative: Taken from H&P Stephen Paul is a 86 y.o. male with medical history significant of diabetes and hypertension who apparently was told about a week ago that he was positive for COVID-19.  Came to ED with worsening shortness of breath.  Found to be hypoxic in 70s.  Chest x-ray consistent with COVID-19 pneumonia.  CTA negative. Patient is unvaccinated.  Subjective: Pt said he wanted to go home.  Seemed unable to understand why he needs to be in the hospital.  Reported having hard time chewing his foods.     Assessment & Plan:   Principal Problem:   Acute respiratory failure due to COVID-19 [facility](HCC) Active Problems:   COPD with acute exacerbation (HCC)   Essential hypertension   Diabetes (HCC)   Goals of care, counseling/discussion   Palliative care by specialist   DNR (do not resuscitate)  Acute hypoxic respiratory failure secondary to COVID-19 pneumonia sepsis secondary to COVID-19 infection. Patient did meet sepsis criteria on presentation with being tachycardic, tachypneic, endorgan damage with lactic acidosis, AKI, elevated T bili and COVID-19 infection.  Patient did not receive any fluid on admission.  No cultures obtained.  Procalcitonin elevated at 0.51. UA was positive for ketones but not very impressive for any infection.  Blood cultures negative. Completed the course of remdesivir, baricitinib was given initially and discontinued due to worsening leukocytosis. D-dimer and CRP started improving.   --Completed the course of ceftriaxone and Zithromax. --Still requiring heated high flow with non-rebreather on top -Palliative care consulted to discuss the goals of care. PLAN:  -Continue Solu-Medrol 40 mg BID -Continue supplemental oxygen with heated HFNC to keep saturation above 88%. -Continue supplements. --Combivent q6h  Non anion gap metabolic  acidosis/lactic acidosis.  Resolved.  Lactic acid remained elevated.  CK within normal limit.  No history of liver disease, liver functions normal.  Patient is hypoxic and lethargic secondary to COVID-19 pneumonia. -Continue to monitor  Anemia.  Hemoglobin at 10.3.  Anemia panel results with some iron deficiency along with low TIBC and saturation, more consistent with anemia of chronic disease with some iron deficiency.  Ferritin within normal range. --continue oral iron supplment  Type 2 diabetes mellitus.  Patient to have ketones in his urine most likely secondary to poor p.o. intake.  No anion gap.  A1c of 7.1.  Patient was on Metformin and glipizide at home.  Had 1 episode of hypoglycemia . --continue Levemir 10u BID -Continue moderate scale sliding scale.   Elevated troponin.  Mildly elevated troponin with a flat curve at 19. Most likely secondary to demand as there is no chest pain.  Hypertension.  Blood pressure mildly elevated. --start amlodipine 10 mg daily (new)  AKI.   Cr peaked at 1.49.  Baseline around 1.  AKI resolved with gentle IV hydration. --Hold further MIVF, encourage oral hydration  History of DVT on Eliquis Was on Eliquis at home due to history of DVT in February 2021.  CTA negative for acute PE. --continue home Eliquis   Objective: Vitals:   04/12/20 1520 04/12/20 1521 04/12/20 1522 04/12/20 1527  BP:    (!) 147/82  Pulse: (!) 112 (!) 106 100 90  Resp: (!) 34 (!) 25 17 (!) 23  Temp:    97.9 F (36.6 C)  TempSrc:    Oral  SpO2: (!) 82% (!) 84% 90% 98%  Weight:  Height:        Intake/Output Summary (Last 24 hours) at 04/12/2020 1537 Last data filed at 04/12/2020 0853 Gross per 24 hour  Intake 460 ml  Output 1500 ml  Net -1040 ml   Filed Weights   04/09/20 0603 04/10/20 0300 04/12/20 0325  Weight: 82.9 kg 81.7 kg 82.2 kg    Examination:  Constitutional: NAD, alert, oriented to self and hospital HEENT: conjunctivae and lids normal,  EOMI CV: RRR no M,R,G. Distal pulses +2.  No cyanosis.   RESP: on heated high-flow and non-rebreather mask GI: +BS, NTND Extremities: No effusions, edema in BLE SKIN: warm, dry and intact Neuro: II - XII grossly intact.     DVT prophylaxis: NL:[id number] Code Status: DNR Palliative consulted Family Communication:  Status is: inpatient Dispo:   The patient is from: home Anticipated d/c is to: SNF or LTAC Anticipated d/c date is: unclear Patient currently is not medically stable to d/c due to: has very high O2 requirement, unable to wean despite max therapy.  Pt is at high risk of decompensation.     Consultants:   Pulmonary  Procedures:  Antimicrobials:   Data Reviewed: I have personally reviewed following labs and imaging studies  CBC: Recent Labs  Lab 04/06/20 0552 04/06/20 0552 04/07/20 0522 04/08/20 0518 04/09/20 0710 04/11/20 0429 04/12/20 1215  WBC 14.3*   < > 11.4* 12.9* 13.2* 14.4* 16.6*  NEUTROABS 12.3*  --  9.6* 11.2*  --   --   --   HGB 10.6*   < > 10.3* 10.4* 10.3* 10.3* 11.0*  HCT 31.9*   < > 30.0* 31.3* 31.3* 32.8* 32.4*  MCV 64.1*   < > 64.0* 64.8* 65.1* 66.8* 64.2*  PLT 349   < > 341 363 379 402* 407*   < > = values in this interval not displayed.   Basic Metabolic Panel: Recent Labs  Lab 04/06/20 0552 04/06/20 0552 04/07/20 0522 04/08/20 0518 04/09/20 0710 04/11/20 0429 04/12/20 1215  NA 142   < > 141 141 137 138 138  K 3.4*   < > 3.4* 3.9 3.5 4.5 5.2*  CL 107   < > 104 99 102 107 107  CO2 21*   < > 25 29 22 22 24   GLUCOSE 208*   < > 179* 89 93 130* 168*  BUN 29*   < > 29* 25* 22 19 21   CREATININE 1.19   < > 1.07 1.28* 1.09 0.94 1.01  CALCIUM 7.5*   < > 7.2* 6.9* 6.9* 7.7* 8.0*  MG 2.3  --  2.1 2.2  --   --  1.9  PHOS 2.8  --  2.8 3.8  --   --   --    < > = values in this interval not displayed.   GFR: Estimated Creatinine Clearance: 52.9 mL/min (by C-G formula based on SCr of 1.01 mg/dL). Liver Function Tests: Recent Labs  Lab  04/06/20 0552 04/07/20 0522 04/08/20 0518 04/09/20 0710 04/11/20 0429  AST 46* 37 39 29 36  ALT 44 34 30 25 27   ALKPHOS 59 59 65 65 65  BILITOT 0.9 0.8 0.9 0.7 0.8  PROT 6.2* 5.9* 5.9* 6.0* 6.2*  ALBUMIN 2.2* 2.0* 2.2* 2.1* 2.1*   No results for input(s): LIPASE, AMYLASE in the last 168 hours. No results for input(s): AMMONIA in the last 168 hours. Coagulation Profile: No results for input(s): INR, PROTIME in the last 168 hours. Cardiac Enzymes: Recent Labs  Lab 04/06/20 [id number]  CKTOTAL 225   BNP (last 3 results) No results for input(s): PROBNP in the last 8760 hours. HbA1C: No results for input(s): HGBA1C in the last 72 hours. CBG: Recent Labs  Lab 04/11/20 1204 04/11/20 1635 04/11/20 2106 04/12/20 0819 04/12/20 1203  GLUCAP 267* 227* 292* 93 167*   Lipid Profile: No results for input(s): CHOL, HDL, LDLCALC, TRIG, CHOLHDL, LDLDIRECT in the last 72 hours. Thyroid Function Tests: No results for input(s): TSH, T4TOTAL, FREET4, T3FREE, THYROIDAB in the last 72 hours. Anemia Panel: No results for input(s): VITAMINB12, FOLATE, FERRITIN, TIBC, IRON, RETICCTPCT in the last 72 hours. Sepsis Labs: Recent Labs  Lab 04/06/20 0552 04/07/20 0522 04/07/20 1025 04/08/20 0518 04/11/20 0429  PROCALCITON 0.18 <0.10  --  <0.10 <0.10  LATICACIDVEN 3.3*  --  4.0*  --   --     Recent Results (from the past 240 hour(s))  SARS Coronavirus 2 by RT PCR (hospital order, performed in [facility] hospital lab) Nasopharyngeal Nasopharyngeal Swab     Status: Abnormal   Collection Time: 04/03/20  3:30 PM   Specimen: Nasopharyngeal Swab  Result Value Ref Range Status   SARS Coronavirus 2 POSITIVE (A) NEGATIVE Final    Comment: RESULT CALLED TO, READ BACK BY AND VERIFIED WITH: SAMANTHA HAMILTON 04/03/20 AT 1748 BY ACR (NOTE) SARS-CoV-2 target nucleic acids are DETECTED  SARS-CoV-2 RNA is generally detectable in upper respiratory specimens  during the acute phase of infection.  Positive  results are indicative  of the presence of the identified virus, but do not rule out bacterial infection or co-infection with other pathogens not detected by the test.  Clinical correlation with patient history and  other diagnostic information is necessary to determine patient infection status.  The expected result is negative.  Fact Sheet for Patients:   BoilerBrush.com.cy   Fact Sheet for Healthcare Providers:   https://pope.com/    This test is not yet approved or cleared by the [place] FDA and  has been authorized for detection and/or diagnosis of SARS-CoV-2 by FDA under an Emergency Use Authorization (EUA).  This EUA will remain in effect (meaning th is test can be used) for the duration of  the COVID-19 declaration under Section 564(b)(1) of the Act, 21 U.S.C. section 360-bbb-3(b)(1), unless the authorization is terminated or revoked sooner.  Performed at [facility], [address] [place], [place] [address]   CULTURE, BLOOD (ROUTINE X 2) w Reflex to ID Panel     Status: None   Collection Time: 04/04/20 11:19 AM   Specimen: BLOOD  Result Value Ref Range Status   Specimen Description BLOOD L ARM  Final   Special Requests   Final    BOTTLES DRAWN AEROBIC AND ANAEROBIC Blood Culture adequate volume   Culture   Final    NO GROWTH 5 DAYS Performed at [facility], [address] [place], [place] [address]    Report Status 04/09/2020 FINAL  Final  CULTURE, BLOOD (ROUTINE X 2) w Reflex to ID Panel     Status: None   Collection Time: 04/04/20 11:22 AM   Specimen: BLOOD  Result Value Ref Range Status   Specimen Description BLOOD L HAND  Final   Special Requests   Final    BOTTLES DRAWN AEROBIC AND ANAEROBIC Blood Culture adequate volume   Culture   Final    NO GROWTH 5 DAYS Performed at [facility], [address], [place], [place] [address]    Report Status 04/09/2020 FINAL  Final   MRSA PCR Screening     Status: None   Collection Time: 04/05/20 11:57 AM   Specimen: Nasal Mucosa; Nasopharyngeal  Result Value Ref Range Status   MRSA by PCR NEGATIVE NEGATIVE Final    Comment:        The GeneXpert MRSA Assay (FDA approved for NASAL specimens only), is one component of a comprehensive MRSA colonization surveillance program. It is not intended to diagnose MRSA infection nor to guide or monitor treatment for MRSA infections. Performed at [facility], [address] [place], [place] [address]      Radiology Studies: [facility] Chest [place] 1 View  Result Date: 04/11/2020 CLINICAL DATA:  Hypoxia.  COVID positive EXAM: PORTABLE CHEST 1 VIEW COMPARISON:  04/08/2020 FINDINGS: Stable airspace and interstitial opacity. Normal heart size. No visible effusion or pneumothorax. Artifact from EKG leads IMPRESSION: Stable infiltrates. Electronically Signed   By: Marnee Spring M.D.   On: 04/11/2020 09:13    Scheduled Meds: . apixaban  5 mg Oral BID  . ferrous fumarate-b12-vitamic C-folic acid  1 capsule Oral BID PC  . insulin aspart  0-15 Units Subcutaneous TID WC  . insulin aspart  0-5 Units Subcutaneous QHS  . insulin detemir  10 Units Subcutaneous BID  . Ipratropium-Albuterol  1 puff Inhalation Q6H  . linagliptin  5 mg Oral Daily  . mouth rinse  15 mL Mouth Rinse BID  . methylPREDNISolone (SOLU-MEDROL) injection  40 mg Intravenous Q12H  . multivitamin with minerals  1 tablet Oral Daily  . thiamine  100 mg Oral Daily   Continuous Infusions:    LOS: 9 days    Darlin Priestly, MD [facility]  If 7PM-7AM, please contact night-coverage [email or web address]  04/12/2020, 3:37 PM

## 2020-04-13 DIAGNOSIS — D509 Iron deficiency anemia, unspecified: Secondary | ICD-10-CM | POA: Diagnosis present

## 2020-04-13 DIAGNOSIS — E872 Acidosis, unspecified: Secondary | ICD-10-CM | POA: Diagnosis present

## 2020-04-13 DIAGNOSIS — Z7901 Long term (current) use of anticoagulants: Secondary | ICD-10-CM

## 2020-04-13 DIAGNOSIS — N179 Acute kidney failure, unspecified: Secondary | ICD-10-CM | POA: Diagnosis present

## 2020-04-13 LAB — CBC
HCT: 32.1 % — ABNORMAL LOW (ref 39.0–52.0)
Hemoglobin: 10.8 g/dL — ABNORMAL LOW (ref 13.0–17.0)
MCH: 21.8 pg — ABNORMAL LOW (ref 26.0–34.0)
MCHC: 33.6 g/dL (ref 30.0–36.0)
MCV: 64.8 fL — ABNORMAL LOW (ref 80.0–100.0)
Platelets: 396 10*3/uL (ref 150–400)
RBC: 4.95 MIL/uL (ref 4.22–5.81)
RDW: 21.3 % — ABNORMAL HIGH (ref 11.5–15.5)
WBC: 13.5 10*3/uL — ABNORMAL HIGH (ref 4.0–10.5)
nRBC: 0 % (ref 0.0–0.2)

## 2020-04-13 LAB — BASIC METABOLIC PANEL
Anion gap: 8 (ref 5–15)
BUN: 21 mg/dL (ref 8–23)
CO2: 22 mmol/L (ref 22–32)
Calcium: 7.9 mg/dL — ABNORMAL LOW (ref 8.9–10.3)
Chloride: 106 mmol/L (ref 98–111)
Creatinine, Ser: 0.99 mg/dL (ref 0.61–1.24)
GFR calc Af Amer: >60 mL/min (ref >60)
GFR calc non Af Amer: >60 mL/min (ref >60)
Glucose, Bld: 142 mg/dL — ABNORMAL HIGH (ref 70–99)
Potassium: 5 mmol/L (ref 3.5–5.1)
Sodium: 136 mmol/L (ref 135–145)

## 2020-04-13 LAB — GLUCOSE, CAPILLARY
Glucose-Capillary: 129 mg/dL — ABNORMAL HIGH (ref 70–99)
Glucose-Capillary: 261 mg/dL — ABNORMAL HIGH (ref 70–99)
Glucose-Capillary: 357 mg/dL — ABNORMAL HIGH (ref 70–99)
Glucose-Capillary: 320 mg/dL — ABNORMAL HIGH (ref 70–99)

## 2020-04-13 LAB — MAGNESIUM: Magnesium: 1.9 mg/dL (ref 1.7–2.4)

## 2020-04-13 MED ORDER — METHYLPREDNISOLONE SODIUM SUCC 40 MG IJ SOLR
40.0000 mg | Freq: Every day | INTRAMUSCULAR | Status: DC
  Administered 2020-04-14 – 2020-04-15 (×2): 40 mg via INTRAVENOUS
  Filled 2020-04-13 (×2): qty 1

## 2020-04-13 NOTE — Progress Notes (Signed)
PROGRESS NOTE    Stephen Paul  SWN:[id number] DOB: 1932-05-30 DOA: 04/03/2020 PCP: Barbette Reichmann, MD   Brief Narrative: Taken from H&P Stephen Paul is a 86 y.o. male with medical history significant of diabetes and hypertension who apparently was told about a week ago that he was positive for COVID-19.  Came to ED with worsening shortness of breath.  Found to be hypoxic in 70s.  Chest x-ray consistent with COVID-19 pneumonia.  CTA negative. Patient is unvaccinated.  Subjective: Pt continued to need both heated hf and non-rebreather to maintain O2 sat >88%.     Assessment & Plan:   Principal Problem:   Acute respiratory failure due to COVID-19 [facility]) Active Problems:   Essential hypertension   Diabetes (HCC)   Goals of care, counseling/discussion   Palliative care by specialist   DNR (do not resuscitate)   Lactic acidosis   Anemia, iron deficiency   AKI (acute kidney injury) (HCC)   Chronic anticoagulation  Acute hypoxic respiratory failure secondary to COVID-19 pneumonia sepsis secondary to COVID-19 infection. Patient did meet sepsis criteria on presentation with being tachycardic, tachypneic, endorgan damage with lactic acidosis, AKI, elevated T bili and COVID-19 infection.  Patient did not receive any fluid on admission.  No cultures obtained.  Procalcitonin elevated at 0.51. UA was positive for ketones but not very impressive for any infection.  Blood cultures negative. Completed the course of remdesivir, baricitinib was given initially and discontinued due to worsening leukocytosis. D-dimer and CRP started improving.   --Completed the course of ceftriaxone and Zithromax. --Still requiring heated high flow with non-rebreather on top -Palliative care consulted to discuss the goals of care. PLAN:  -taper down to solumedrol 40 mg daily -Continue supplemental oxygen with heated HFNC to keep saturation above 88%. -Continue supplements. --Combivent q6h  Non anion gap  metabolic acidosis/lactic acidosis.  Resolved.  Lactic acid remained elevated.  CK within normal limit.  No history of liver disease, liver functions normal.  Patient is hypoxic and lethargic secondary to COVID-19 pneumonia. -Continue to monitor  Anemia.  Hemoglobin at 10.3.   Anemia workup showed iron def.  Folate and vit B12 wnl. --continue oral iron supplement  Type 2 diabetes mellitus.  Patient to have ketones in his urine most likely secondary to poor p.o. intake.  No anion gap.  A1c of 7.1.  Had 1 episode of hypoglycemia . PLAN: --continue linagliptin --continue Levemir 10u BID -Continue moderate scale sliding scale.  Elevated troponin.  Mildly elevated troponin with a flat curve at 19. Most likely secondary to demand as there is no chest pain.  Hypertension.   Blood pressure acceptable --continue amlodipine 10 mg daily (new)  AKI.   Cr peaked at 1.49.  Baseline around 1.  AKI resolved with gentle IV hydration. --Hold further MIVF, encourage oral hydration  History of DVT on Eliquis Was on Eliquis at home due to history of DVT in February 2021.  CTA negative for acute PE. --continue home Eliquis   Objective: Vitals:   04/13/20 0930 04/13/20 1230 04/13/20 1455 04/13/20 1952  BP:  124/74 (!) 126/101 137/85  Pulse:  88 91 99  Resp:  17 18 16   Temp:  97.8 F (36.6 C) 97.7 F (36.5 C) 98.2 F (36.8 C)  TempSrc:  Axillary Axillary Axillary  SpO2: 96% 97% 92% 94%  Weight:      Height:        Intake/Output Summary (Last 24 hours) at 04/13/2020 2029 Last data filed at 04/13/2020  2000 Gross per 24 hour  Intake 200 ml  Output 2100 ml  Net -1900 ml   Filed Weights   04/09/20 0603 04/10/20 0300 04/12/20 0325  Weight: 82.9 kg 81.7 kg 82.2 kg    Examination:  Constitutional: NAD, alert, oriented to self and place HEENT: conjunctivae and lids normal, EOMI CV: RRR no M,R,G. Distal pulses +2.  No cyanosis.   RESP: reduced lung sounds, on both heated hf and  nonrebreather. GI: +BS, NTND Extremities: No effusions, edema in BLE SKIN: warm, dry and intact Neuro: II - XII grossly intact.  Sensation intact Psych: Normal mood and affect.      DVT prophylaxis: [id number] Code Status: DNR Palliative consulted Family Communication:  Status is: inpatient Dispo:   The patient is from: home Anticipated d/c is to: SNF or LTAC Anticipated d/c date is: unclear Patient currently is not medically stable to d/c due to: has very high O2 requirement, unable to wean despite max therapy.  Pt is at high risk of decompensation.     Consultants:   Pulmonary  Procedures:  Antimicrobials:   Data Reviewed: I have personally reviewed following labs and imaging studies  CBC: Recent Labs  Lab 04/07/20 0522 04/07/20 0522 04/08/20 0518 04/09/20 0710 04/11/20 0429 04/12/20 1215 04/13/20 0752  WBC 11.4*   < > 12.9* 13.2* 14.4* 16.6* 13.5*  NEUTROABS 9.6*  --  11.2*  --   --   --   --   HGB 10.3*   < > 10.4* 10.3* 10.3* 11.0* 10.8*  HCT 30.0*   < > 31.3* 31.3* 32.8* 32.4* 32.1*  MCV 64.0*   < > 64.8* 65.1* 66.8* 64.2* 64.8*  PLT 341   < > 363 379 402* 407* 396   < > = values in this interval not displayed.   Basic Metabolic Panel: Recent Labs  Lab 04/07/20 0522 04/07/20 0522 04/08/20 0518 04/09/20 0710 04/11/20 0429 04/12/20 1215 04/13/20 0752  NA 141   < > 141 137 138 138 136  K 3.4*   < > 3.9 3.5 4.5 5.2* 5.0  CL 104   < > 99 102 107 107 106  CO2 25   < > 29 22 22 24 22   GLUCOSE 179*   < > 89 93 130* 168* 142*  BUN 29*   < > 25* 22 19 21 21   CREATININE 1.07   < > 1.28* 1.09 0.94 1.01 0.99  CALCIUM 7.2*   < > 6.9* 6.9* 7.7* 8.0* 7.9*  MG 2.1  --  2.2  --   --  1.9 1.9  PHOS 2.8  --  3.8  --   --   --   --    < > = values in this interval not displayed.   GFR: Estimated Creatinine Clearance: 53.9 mL/min (by C-G formula based on SCr of 0.99 mg/dL). Liver Function Tests: Recent Labs  Lab 04/07/20 0522 04/08/20 0518 04/09/20 0710  04/11/20 0429  AST 37 39 29 36  ALT 34 30 25 27   ALKPHOS 59 65 65 65  BILITOT 0.8 0.9 0.7 0.8  PROT 5.9* 5.9* 6.0* 6.2*  ALBUMIN 2.0* 2.2* 2.1* 2.1*   No results for input(s): LIPASE, AMYLASE in the last 168 hours. No results for input(s): AMMONIA in the last 168 hours. Coagulation Profile: No results for input(s): INR, PROTIME in the last 168 hours. Cardiac Enzymes: No results for input(s): CKTOTAL, CKMB, CKMBINDEX, TROPONINI in the last 168 hours. BNP (last 3 results) No results  for input(s): PROBNP in the last 8760 hours. HbA1C: No results for input(s): HGBA1C in the last 72 hours. CBG: Recent Labs  Lab 04/12/20 1627 04/12/20 1952 04/13/20 0818 04/13/20 1231 04/13/20 1650  GLUCAP 262* 173* 129* 261* 357*   Lipid Profile: No results for input(s): CHOL, HDL, LDLCALC, TRIG, CHOLHDL, LDLDIRECT in the last 72 hours. Thyroid Function Tests: No results for input(s): TSH, T4TOTAL, FREET4, T3FREE, THYROIDAB in the last 72 hours. Anemia Panel: No results for input(s): VITAMINB12, FOLATE, FERRITIN, TIBC, IRON, RETICCTPCT in the last 72 hours. Sepsis Labs: Recent Labs  Lab 04/07/20 0522 04/07/20 1025 04/08/20 0518 04/11/20 0429  PROCALCITON <0.10  --  <0.10 <0.10  LATICACIDVEN  --  4.0*  --   --     Recent Results (from the past 240 hour(s))  CULTURE, BLOOD (ROUTINE X 2) w Reflex to ID Panel     Status: None   Collection Time: 04/04/20 11:19 AM   Specimen: BLOOD  Result Value Ref Range Status   Specimen Description BLOOD L ARM  Final   Special Requests   Final    BOTTLES DRAWN AEROBIC AND ANAEROBIC Blood Culture adequate volume   Culture   Final    NO GROWTH 5 DAYS Performed at [facility], [address] [place], [place] [address]    Report Status 04/09/2020 FINAL  Final  CULTURE, BLOOD (ROUTINE X 2) w Reflex to ID Panel     Status: None   Collection Time: 04/04/20 11:22 AM   Specimen: BLOOD  Result Value Ref Range Status   Specimen Description BLOOD  L HAND  Final   Special Requests   Final    BOTTLES DRAWN AEROBIC AND ANAEROBIC Blood Culture adequate volume   Culture   Final    NO GROWTH 5 DAYS Performed at [facility], [address] [place], [place] [address]    Report Status 04/09/2020 FINAL  Final  MRSA PCR Screening     Status: None   Collection Time: 04/05/20 11:57 AM   Specimen: Nasal Mucosa; Nasopharyngeal  Result Value Ref Range Status   MRSA by PCR NEGATIVE NEGATIVE Final    Comment:        The GeneXpert MRSA Assay (FDA approved for NASAL specimens only), is one component of a comprehensive MRSA colonization surveillance program. It is not intended to diagnose MRSA infection nor to guide or monitor treatment for MRSA infections. Performed at [facility], [address] [place], [place] [address]      Radiology Studies: No results found.  Scheduled Meds: . amLODipine  10 mg Oral Daily  . apixaban  5 mg Oral BID  . ferrous fumarate-b12-vitamic C-folic acid  1 capsule Oral BID PC  . insulin aspart  0-15 Units Subcutaneous TID WC  . insulin aspart  0-5 Units Subcutaneous QHS  . insulin detemir  10 Units Subcutaneous BID  . Ipratropium-Albuterol  1 puff Inhalation Q6H  . linagliptin  5 mg Oral Daily  . mouth rinse  15 mL Mouth Rinse BID  . [START ON 04/14/2020] methylPREDNISolone (SOLU-MEDROL) injection  40 mg Intravenous Daily  . multivitamin with minerals  1 tablet Oral Daily  . thiamine  100 mg Oral Daily   Continuous Infusions:    LOS: 10 days    Darlin Priestly, MD [facility]  If 7PM-7AM, please contact night-coverage [email or web address]  04/13/2020, 8:29 PM

## 2020-04-13 NOTE — Progress Notes (Signed)
Pt's wife called and has been updated on patients status.  Tried to get patient on the phone with her but he said, no, he does not want to talk on the phone at this time.

## 2020-04-14 LAB — BASIC METABOLIC PANEL
Anion gap: 9 (ref 5–15)
BUN: 25 mg/dL — ABNORMAL HIGH (ref 8–23)
CO2: 24 mmol/L (ref 22–32)
Calcium: 8.4 mg/dL — ABNORMAL LOW (ref 8.9–10.3)
Chloride: 105 mmol/L (ref 98–111)
Creatinine, Ser: 1.1 mg/dL (ref 0.61–1.24)
GFR calc Af Amer: >60 mL/min (ref >60)
GFR calc non Af Amer: >60 mL/min (ref >60)
Glucose, Bld: 124 mg/dL — ABNORMAL HIGH (ref 70–99)
Potassium: 4.5 mmol/L (ref 3.5–5.1)
Sodium: 138 mmol/L (ref 135–145)

## 2020-04-14 LAB — CBC
HCT: 33.9 % — ABNORMAL LOW (ref 39.0–52.0)
Hemoglobin: 11.2 g/dL — ABNORMAL LOW (ref 13.0–17.0)
MCH: 21.3 pg — ABNORMAL LOW (ref 26.0–34.0)
MCHC: 33 g/dL (ref 30.0–36.0)
MCV: 64.4 fL — ABNORMAL LOW (ref 80.0–100.0)
Platelets: 450 10*3/uL — ABNORMAL HIGH (ref 150–400)
RBC: 5.26 MIL/uL (ref 4.22–5.81)
RDW: 21.3 % — ABNORMAL HIGH (ref 11.5–15.5)
WBC: 15.2 10*3/uL — ABNORMAL HIGH (ref 4.0–10.5)
nRBC: 0 % (ref 0.0–0.2)

## 2020-04-14 LAB — C-REACTIVE PROTEIN: CRP: 2.1 mg/dL — ABNORMAL HIGH (ref <1.0)

## 2020-04-14 LAB — GLUCOSE, CAPILLARY
Glucose-Capillary: 80 mg/dL (ref 70–99)
Glucose-Capillary: 166 mg/dL — ABNORMAL HIGH (ref 70–99)
Glucose-Capillary: 319 mg/dL — ABNORMAL HIGH (ref 70–99)
Glucose-Capillary: 248 mg/dL — ABNORMAL HIGH (ref 70–99)

## 2020-04-14 LAB — MAGNESIUM: Magnesium: 1.9 mg/dL (ref 1.7–2.4)

## 2020-04-14 MED ORDER — IPRATROPIUM-ALBUTEROL 20-100 MCG/ACT IN AERS
1.0000 | INHALATION_SPRAY | Freq: Four times a day (QID) | RESPIRATORY_TRACT | Status: DC
  Administered 2020-04-14 – 2020-04-24 (×43): 1 via RESPIRATORY_TRACT
  Filled 2020-04-14: qty 4

## 2020-04-14 NOTE — Progress Notes (Signed)
Physical Therapy Treatment Patient Details Name: Stephen Paul MRN: [id number] DOB: Oct 14, 1931 Today's Date: 04/14/2020    History of Present Illness Stephen Paul is an 88yoM who comes to [facility] on 9/8 c SOB. Pt tested positive 1 week prior, but had no symptoms until recently. PMH: DM, HTN, (+) COVID test ~1 week prior to arrival. Pt moved to 55L heated highflow on 9/13.    PT Comments    Pt lethargic but awake this am.  Initially declines therapy but does agree to supine ex with encouragement.  Stated he does not feel well and wants to go home.  Continues to question why he is here throughout session stating he has been in bed too long but does not want to try to get up.  He participated in ex with encouragement but given lethargy and only moderate participation he is not safe to progress mobility at this  Time.  Seems to fall asleep after session.    Follow Up Recommendations  Supervision/Assistance - 24 hour;LTACH;SNF (per progress)     Equipment Recommendations   (TBD at next venue of care)    Recommendations for Other Services       Precautions / Restrictions Precautions Precautions: Fall    Mobility  Bed Mobility                  Transfers                    Ambulation/Gait                 Stairs             Wheelchair Mobility    Modified Rankin (Stroke Patients Only)       Balance                                            Cognition Arousal/Alertness: Lethargic Behavior During Therapy: WFL for tasks assessed/performed Overall Cognitive Status: No family/caregiver present to determine baseline cognitive functioning                                 General Comments: sleepy this am, soem mumbling speech and difficult to understand at times.  does state he wants to go home.      Exercises Other Exercises Other Exercises: supine ankle pumps, heel slides, ab/add and slr x 10 AAROM    General  Comments        Pertinent Vitals/Pain Pain Assessment: No/denies pain    Home Living                      Prior Function            PT Goals (current goals can now be found in the care plan section) Progress towards PT goals: Not progressing toward goals - comment    Frequency    Min 2X/week      PT Plan Current plan remains appropriate    Co-evaluation              AM-PAC PT "6 Clicks" Mobility   Outcome Measure  Help needed turning from your back to your side while in a flat bed without using bedrails?: A Lot Help needed moving from lying on your back to sitting on the side  of a flat bed without using bedrails?: A Lot Help needed moving to and from a bed to a chair (including a wheelchair)?: A Lot Help needed standing up from a chair using your arms (e.g., wheelchair or bedside chair)?: A Lot Help needed to walk in hospital room?: Total Help needed climbing 3-5 steps with a railing? : Total 6 Click Score: 10    End of Session Equipment Utilized During Treatment: Oxygen Activity Tolerance: Patient limited by fatigue Patient left: in bed;with call bell/phone within reach;with bed alarm set Nurse Communication: Mobility status PT Visit Diagnosis: Other abnormalities of gait and mobility (R26.89)     Time: 1000-1010 PT Time Calculation (min) (ACUTE ONLY): 10 min  Charges:  $Therapeutic Exercise: 8-22 mins                     Danielle Dess, PTA 04/14/20, 11:04 AM '

## 2020-04-14 NOTE — Progress Notes (Signed)
PROGRESS NOTE    [name]  CZY:[id number] DOB: [birth year] DOA: 04/03/2020 PCP: Barbette Reichmann, MD   Brief Narrative: Taken from H&P Stephen Paul is a 86 y.o. male with medical history significant of diabetes and hypertension who apparently was told about a week ago that he was positive for COVID-19.  Came to ED with worsening shortness of breath.  Found to be hypoxic in 70s.  Chest x-ray consistent with COVID-19 pneumonia.  CTA negative. Patient is unvaccinated.  Subjective: No change in pt's high O2 requirement.  Nursing reported pt was able to eat some of his meals.   Assessment & Plan:   Principal Problem:   Acute respiratory failure due to COVID-19 [facility]) Active Problems:   Essential hypertension   Diabetes (HCC)   Goals of care, counseling/discussion   Palliative care by specialist   DNR (do not resuscitate)   Lactic acidosis   Anemia, iron deficiency   AKI (acute kidney injury) (HCC)   Chronic anticoagulation  Acute hypoxic respiratory failure secondary to COVID-19 pneumonia sepsis secondary to COVID-19 infection. Patient did meet sepsis criteria on presentation with being tachycardic, tachypneic, endorgan damage with lactic acidosis, AKI, elevated T bili and COVID-19 infection.  Patient did not receive any fluid on admission.  No cultures obtained.  Procalcitonin elevated at 0.51. UA was positive for ketones but not very impressive for any infection.  Blood cultures negative. Completed the course of remdesivir, baricitinib was given initially and discontinued due to worsening leukocytosis. D-dimer and CRP started improving.   --Completed the course of ceftriaxone and Zithromax. --Still requiring heated high flow with non-rebreather on top -Palliative care consulted to discuss the goals of care. PLAN: --continue solumedrol 40 mg daily -Continue supplemental oxygen with heated HFNC AND nonrebreather to keep saturation above 88%. -Continue  supplements. --Combivent q6h  Non anion gap metabolic acidosis lactic acidosis.  Resolved.  Lactic acid remained elevated.  CK within normal limit.  No history of liver disease, liver functions normal.  Patient is hypoxic and lethargic secondary to COVID-19 pneumonia. -Continue to monitor  Anemia.   --Hgb stable between 10-11 Anemia workup showed iron def.  Folate and vit B12 wnl. --continue oral iron supplement  Type 2 diabetes mellitus.  Patient to have ketones in his urine most likely secondary to poor p.o. intake.  No anion gap.  A1c of 7.1.  Had 1 episode of hypoglycemia . PLAN: --continue linagliptin --continue Levemir 10u BID -Continue moderate scale sliding scale.  Elevated troponin.  Mildly elevated troponin with a flat curve at 19. Most likely secondary to demand as there is no chest pain.  Hypertension.   Blood pressure from 120's-160's --continue amlodipine 10 mg daily (new)  AKI.   Cr peaked at 1.49.  Baseline around 1.  AKI resolved with gentle IV hydration. --Hold further MIVF, encourage oral hydration  History of DVT on Eliquis Was on Eliquis at home due to history of DVT in February 2021.  CTA negative for acute PE. --continue home Eliquis  Leukocytosis Likely due to steroid.  Procal neg as of 9/16.  No other signs and symptoms of infection other than COVID.  No fever. --Monitor for now   Objective: Vitals:   04/14/20 1059 04/14/20 1100 04/14/20 1200 04/14/20 1213  BP:    129/72  Pulse:  84 99 98  Resp:  20 (!) 25 20  Temp:    98 F (36.7 C)  TempSrc:    Axillary  SpO2: 92% 93% 92% 91%  Weight:      Height:        Intake/Output Summary (Last 24 hours) at 04/14/2020 1543 Last data filed at 04/14/2020 0300 Gross per 24 hour  Intake --  Output 1200 ml  Net -1200 ml   Filed Weights   04/10/20 0300 04/12/20 0325 04/14/20 0534  Weight: 81.7 kg 82.2 kg 80.5 kg    Examination:  Constitutional: NAD, alert, oriented HEENT: conjunctivae and lids  normal, EOMI CV: RRR no M,R,G. Distal pulses +2.  No cyanosis.   RESP: on heated hf and nonrebreather  GI: +BS, NTND Extremities: No effusions, edema in BLE SKIN: warm, dry and intact Neuro: II - XII grossly intact.  Sensation intact Psych: Normal mood and affect.     DVT prophylaxis: IE:PPIRJJO Code Status: DNR Palliative consulted Family Communication:  Status is: inpatient Dispo:   The patient is from: home Anticipated d/c is to: SNF or LTAC Anticipated d/c date is: unclear Patient currently is not medically stable to d/c due to: has very high O2 requirement, unable to wean despite max therapy.  Pt is at high risk of decompensation.     Consultants:   Pulmonary  Procedures:  Antimicrobials:   Data Reviewed: I have personally reviewed following labs and imaging studies  CBC: Recent Labs  Lab 04/08/20 0518 04/08/20 0518 04/09/20 0710 04/11/20 0429 04/12/20 1215 04/13/20 0752 04/14/20 0639  WBC 12.9*   < > 13.2* 14.4* 16.6* 13.5* 15.2*  NEUTROABS 11.2*  --   --   --   --   --   --   HGB 10.4*   < > 10.3* 10.3* 11.0* 10.8* 11.2*  HCT 31.3*   < > 31.3* 32.8* 32.4* 32.1* 33.9*  MCV 64.8*   < > 65.1* 66.8* 64.2* 64.8* 64.4*  PLT 363   < > 379 402* 407* 396 450*   < > = values in this interval not displayed.   Basic Metabolic Panel: Recent Labs  Lab 04/08/20 0518 04/08/20 0518 04/09/20 0710 04/11/20 0429 04/12/20 1215 04/13/20 0752 04/14/20 0639  NA 141   < > 137 138 138 136 138  K 3.9   < > 3.5 4.5 5.2* 5.0 4.5  CL 99   < > 102 107 107 106 105  CO2 29   < > 22 22 24 22 24   GLUCOSE 89   < > 93 130* 168* 142* 124*  BUN 25*   < > 22 19 21 21  25*  CREATININE 1.28*   < > 1.09 0.94 1.01 0.99 1.10  CALCIUM 6.9*   < > 6.9* 7.7* 8.0* 7.9* 8.4*  MG 2.2  --   --   --  1.9 1.9 1.9  PHOS 3.8  --   --   --   --   --   --    < > = values in this interval not displayed.   GFR: Estimated Creatinine Clearance: 48.1 mL/min (by C-G formula based on SCr of 1.1  mg/dL). Liver Function Tests: Recent Labs  Lab 04/08/20 0518 04/09/20 0710 04/11/20 0429  AST 39 29 36  ALT 30 25 27   ALKPHOS 65 65 65  BILITOT 0.9 0.7 0.8  PROT 5.9* 6.0* 6.2*  ALBUMIN 2.2* 2.1* 2.1*   No results for input(s): LIPASE, AMYLASE in the last 168 hours. No results for input(s): AMMONIA in the last 168 hours. Coagulation Profile: No results for input(s): INR, PROTIME in the last 168 hours. Cardiac Enzymes: No results for input(s): CKTOTAL,  CKMB, CKMBINDEX, TROPONINI in the last 168 hours. BNP (last 3 results) No results for input(s): PROBNP in the last 8760 hours. HbA1C: No results for input(s): HGBA1C in the last 72 hours. CBG: Recent Labs  Lab 04/13/20 1231 04/13/20 1650 04/13/20 2114 04/14/20 0836 04/14/20 1211  GLUCAP 261* 357* 320* 80 166*   Lipid Profile: No results for input(s): CHOL, HDL, LDLCALC, TRIG, CHOLHDL, LDLDIRECT in the last 72 hours. Thyroid Function Tests: No results for input(s): TSH, T4TOTAL, FREET4, T3FREE, THYROIDAB in the last 72 hours. Anemia Panel: No results for input(s): VITAMINB12, FOLATE, FERRITIN, TIBC, IRON, RETICCTPCT in the last 72 hours. Sepsis Labs: Recent Labs  Lab 04/08/20 0518 04/11/20 0429  PROCALCITON <0.10 <0.10    Recent Results (from the past 240 hour(s))  MRSA PCR Screening     Status: None   Collection Time: 04/05/20 11:57 AM   Specimen: Nasal Mucosa; Nasopharyngeal  Result Value Ref Range Status   MRSA by PCR NEGATIVE NEGATIVE Final    Comment:        The GeneXpert MRSA Assay (FDA approved for NASAL specimens only), is one component of a comprehensive MRSA colonization surveillance program. It is not intended to diagnose MRSA infection nor to guide or monitor treatment for MRSA infections. Performed at [facility], [address] [place], [place] [address]      Radiology Studies: No results found.  Scheduled Meds:  amLODipine  10 mg Oral Daily   apixaban  5 mg Oral BID    ferrous fumarate-b12-vitamic C-folic acid  1 capsule Oral BID PC   insulin aspart  0-15 Units Subcutaneous TID WC   insulin aspart  0-5 Units Subcutaneous QHS   insulin detemir  10 Units Subcutaneous BID   Ipratropium-Albuterol  1 puff Inhalation QID   linagliptin  5 mg Oral Daily   mouth rinse  15 mL Mouth Rinse BID   methylPREDNISolone (SOLU-MEDROL) injection  40 mg Intravenous Daily   multivitamin with minerals  1 tablet Oral Daily   thiamine  100 mg Oral Daily   Continuous Infusions:    LOS: 11 days    Darlin Priestly, MD [facility]  If 7PM-7AM, please contact night-coverage [email or web address]  04/14/2020, 3:43 PM

## 2020-04-15 LAB — GLUCOSE, CAPILLARY
Glucose-Capillary: 79 mg/dL (ref 70–99)
Glucose-Capillary: 176 mg/dL — ABNORMAL HIGH (ref 70–99)
Glucose-Capillary: 419 mg/dL — ABNORMAL HIGH (ref 70–99)
Glucose-Capillary: 355 mg/dL — ABNORMAL HIGH (ref 70–99)

## 2020-04-15 LAB — CBC
HCT: 34.5 % — ABNORMAL LOW (ref 39.0–52.0)
Hemoglobin: 11.4 g/dL — ABNORMAL LOW (ref 13.0–17.0)
MCH: 21.4 pg — ABNORMAL LOW (ref 26.0–34.0)
MCHC: 33 g/dL (ref 30.0–36.0)
MCV: 64.8 fL — ABNORMAL LOW (ref 80.0–100.0)
Platelets: 426 10*3/uL — ABNORMAL HIGH (ref 150–400)
RBC: 5.32 MIL/uL (ref 4.22–5.81)
RDW: 21.7 % — ABNORMAL HIGH (ref 11.5–15.5)
WBC: 14.6 10*3/uL — ABNORMAL HIGH (ref 4.0–10.5)
nRBC: 0 % (ref 0.0–0.2)

## 2020-04-15 LAB — BASIC METABOLIC PANEL
Anion gap: 11 (ref 5–15)
BUN: 21 mg/dL (ref 8–23)
CO2: 24 mmol/L (ref 22–32)
Calcium: 8.2 mg/dL — ABNORMAL LOW (ref 8.9–10.3)
Chloride: 107 mmol/L (ref 98–111)
Creatinine, Ser: 0.98 mg/dL (ref 0.61–1.24)
GFR calc Af Amer: >60 mL/min (ref >60)
GFR calc non Af Amer: >60 mL/min (ref >60)
Glucose, Bld: 62 mg/dL — ABNORMAL LOW (ref 70–99)
Potassium: 5 mmol/L (ref 3.5–5.1)
Sodium: 142 mmol/L (ref 135–145)

## 2020-04-15 LAB — C-REACTIVE PROTEIN: CRP: 5.4 mg/dL — ABNORMAL HIGH (ref <1.0)

## 2020-04-15 LAB — MAGNESIUM: Magnesium: 1.9 mg/dL (ref 1.7–2.4)

## 2020-04-15 MED ORDER — INSULIN ASPART 100 UNIT/ML ~~LOC~~ SOLN
5.0000 [IU] | Freq: Three times a day (TID) | SUBCUTANEOUS | Status: DC
  Administered 2020-04-15 – 2020-04-16 (×4): 5 [IU] via SUBCUTANEOUS
  Filled 2020-04-15 (×4): qty 1

## 2020-04-15 MED ORDER — METHYLPREDNISOLONE SODIUM SUCC 40 MG IJ SOLR
40.0000 mg | Freq: Two times a day (BID) | INTRAMUSCULAR | Status: DC
  Administered 2020-04-15 – 2020-04-17 (×4): 40 mg via INTRAVENOUS
  Filled 2020-04-15 (×4): qty 1

## 2020-04-15 MED ORDER — INSULIN DETEMIR 100 UNIT/ML ~~LOC~~ SOLN
10.0000 [IU] | Freq: Every day | SUBCUTANEOUS | Status: DC
  Administered 2020-04-15 – 2020-04-24 (×10): 10 [IU] via SUBCUTANEOUS
  Filled 2020-04-15 (×10): qty 0.1

## 2020-04-15 MED ORDER — ENSURE ENLIVE PO LIQD
237.0000 mL | Freq: Three times a day (TID) | ORAL | Status: DC
  Administered 2020-04-15 – 2020-04-19 (×4): 237 mL via ORAL

## 2020-04-15 NOTE — Progress Notes (Signed)
Pt's blood sugar is 419. MD aware, new orders/see MAR

## 2020-04-15 NOTE — Progress Notes (Signed)
PROGRESS NOTE    Stephen Paul  EZM:[id number] DOB: [birth year] DOA: 04/03/2020 PCP: Barbette Reichmann, MD   Brief Narrative: Taken from H&P Stephen Paul is a 86 y.o. male with medical history significant of diabetes and hypertension who apparently was told about a week ago that he was positive for COVID-19.  Came to ED with worsening shortness of breath.  Found to be hypoxic in 70s.  Chest x-ray consistent with COVID-19 pneumonia.  CTA negative. Patient is unvaccinated.  Subjective: Was able to wean off non rebreather today.  Pt has been eating a lot better.  BG trending up.   Assessment & Plan:   Principal Problem:   Acute respiratory failure due to COVID-19 [facility]) Active Problems:   Essential hypertension   Diabetes (HCC)   Goals of care, counseling/discussion   Palliative care by specialist   DNR (do not resuscitate)   Lactic acidosis   Anemia, iron deficiency   AKI (acute kidney injury) (HCC)   Chronic anticoagulation  Acute hypoxic respiratory failure secondary to COVID-19 pneumonia sepsis secondary to COVID-19 infection. Patient did meet sepsis criteria on presentation with being tachycardic, tachypneic, endorgan damage with lactic acidosis, AKI, elevated T bili and COVID-19 infection.  Patient did not receive any fluid on admission.  No cultures obtained.  Procalcitonin elevated at 0.51. UA was positive for ketones but not very impressive for any infection.  Blood cultures negative. Completed the course of remdesivir, baricitinib was given initially and discontinued due to worsening leukocytosis. D-dimer and CRP started improving.   --Completed the course of ceftriaxone and Zithromax. --Still requiring heated high flow, but weaned off non rebreather today 9/20 -Palliative care consulted to discuss the goals of care. --CRP trending up again PLAN: --increase solumedrol to 40 mg BID -Continue supplemental oxygen with heated HFNC  -Continue supplements. --Combivent  q6h  Non anion gap metabolic acidosis lactic acidosis.  Resolved.  Lactic acid remained elevated.  CK within normal limit.  No history of liver disease, liver functions normal.  Patient is hypoxic and lethargic secondary to COVID-19 pneumonia. -Continue to monitor  Anemia.   --Hgb stable between 10-11 Anemia workup showed iron def.  Folate and vit B12 wnl. --continue oral iron supplement  Type 2 diabetes mellitus.  Patient to have ketones in his urine most likely secondary to poor p.o. intake.  No anion gap.  A1c of 7.1.  Had 1 episode of hypoglycemia. PLAN: --continue linagliptin --reduce frequency of Levemir to 10u daily --Add meal-time 5u TID -Continue moderate scale sliding scale.  Elevated troponin.  Mildly elevated troponin with a flat curve at 19. Most likely secondary to demand as there is no chest pain.  Hypertension.   Blood pressure from 120's-160's.  Started on amlodipine 10 mg daily (new) --continue amlodipine  AKI.   Cr peaked at 1.49.  Baseline around 1.  AKI resolved with gentle IV hydration. --encourage oral hydration  History of DVT on Eliquis Was on Eliquis at home due to history of DVT in February 2021.  CTA negative for acute PE. --continue home Eliquis  Leukocytosis Likely due to steroid.  Procal neg as of 9/16.  No other signs and symptoms of infection other than COVID.  No fever. --Monitor for now   Objective: Vitals:   04/15/20 0920 04/15/20 1000 04/15/20 1045 04/15/20 1200  BP:    117/75  Pulse:  88  89  Resp:  19  17  Temp:    98.6 F (37 C)  TempSrc:  Oral  SpO2: (!) 84% 92% 93% 98%  Weight:      Height:        Intake/Output Summary (Last 24 hours) at 04/15/2020 1538 Last data filed at 04/15/2020 1206 Gross per 24 hour  Intake 360 ml  Output 1375 ml  Net -1015 ml   Filed Weights   04/12/20 0325 04/14/20 0534 04/15/20 0448  Weight: 82.2 kg 80.5 kg 77.8 kg    Examination:  Constitutional: NAD, alert, oriented HEENT:  conjunctivae and lids normal, EOMI CV: RRR no M,R,G. Distal pulses +2.  No cyanosis.   RESP: on heated hf and nonrebreather  GI: +BS, NTND Extremities: No effusions, edema in BLE SKIN: warm, dry and intact Neuro: II - XII grossly intact.  Sensation intact Psych: Normal mood and affect.     DVT prophylaxis: BP:ZWCHENI Code Status: DNR Palliative consulted Family Communication: wife updated on the phone today 04/15/20  Status is: inpatient Dispo:   The patient is from: home Anticipated d/c is to: LTAC Anticipated d/c date is: 2-3 days Patient currently is not medically stable to d/c due to: need to wean <65% before pt can be discharged to [facility].   Consultants:   Pulmonary  Procedures:  Antimicrobials:   Data Reviewed: I have personally reviewed following labs and imaging studies  CBC: Recent Labs  Lab 04/11/20 0429 04/12/20 1215 04/13/20 0752 04/14/20 0639 04/15/20 0714  WBC 14.4* 16.6* 13.5* 15.2* 14.6*  HGB 10.3* 11.0* 10.8* 11.2* 11.4*  HCT 32.8* 32.4* 32.1* 33.9* 34.5*  MCV 66.8* 64.2* 64.8* 64.4* 64.8*  PLT 402* 407* 396 450* 426*   Basic Metabolic Panel: Recent Labs  Lab 04/11/20 0429 04/12/20 1215 04/13/20 0752 04/14/20 0639 04/15/20 0714  NA 138 138 136 138 142  K 4.5 5.2* 5.0 4.5 5.0  CL 107 107 106 105 107  CO2 22 24 22 24 24   GLUCOSE 130* 168* 142* 124* 62*  BUN 19 21 21  25* 21  CREATININE 0.94 1.01 0.99 1.10 0.98  CALCIUM 7.7* 8.0* 7.9* 8.4* 8.2*  MG  --  1.9 1.9 1.9 1.9   GFR: Estimated Creatinine Clearance: 53.1 mL/min (by C-G formula based on SCr of 0.98 mg/dL). Liver Function Tests: Recent Labs  Lab 04/09/20 0710 04/11/20 0429  AST 29 36  ALT 25 27  ALKPHOS 65 65  BILITOT 0.7 0.8  PROT 6.0* 6.2*  ALBUMIN 2.1* 2.1*   No results for input(s): LIPASE, AMYLASE in the last 168 hours. No results for input(s): AMMONIA in the last 168 hours. Coagulation Profile: No results for input(s): INR, PROTIME in the last 168 hours. Cardiac  Enzymes: No results for input(s): CKTOTAL, CKMB, CKMBINDEX, TROPONINI in the last 168 hours. BNP (last 3 results) No results for input(s): PROBNP in the last 8760 hours. HbA1C: No results for input(s): HGBA1C in the last 72 hours. CBG: Recent Labs  Lab 04/14/20 1211 04/14/20 1643 04/14/20 2042 04/15/20 0850 04/15/20 1207  GLUCAP 166* 319* 248* 79 176*   Lipid Profile: No results for input(s): CHOL, HDL, LDLCALC, TRIG, CHOLHDL, LDLDIRECT in the last 72 hours. Thyroid Function Tests: No results for input(s): TSH, T4TOTAL, FREET4, T3FREE, THYROIDAB in the last 72 hours. Anemia Panel: No results for input(s): VITAMINB12, FOLATE, FERRITIN, TIBC, IRON, RETICCTPCT in the last 72 hours. Sepsis Labs: Recent Labs  Lab 04/11/20 0429  PROCALCITON <0.10    No results found for this or any previous visit (from the past 240 hour(s)).   Radiology Studies: No results found.  Scheduled  Meds: . amLODipine  10 mg Oral Daily  . apixaban  5 mg Oral BID  . feeding supplement (ENSURE ENLIVE)  237 mL Oral TID BM  . ferrous fumarate-b12-vitamic C-folic acid  1 capsule Oral BID PC  . insulin aspart  0-15 Units Subcutaneous TID WC  . insulin detemir  10 Units Subcutaneous Daily  . Ipratropium-Albuterol  1 puff Inhalation QID  . linagliptin  5 mg Oral Daily  . mouth rinse  15 mL Mouth Rinse BID  . methylPREDNISolone (SOLU-MEDROL) injection  40 mg Intravenous Daily  . multivitamin with minerals  1 tablet Oral Daily  . thiamine  100 mg Oral Daily   Continuous Infusions:    LOS: 12 days    Darlin Priestly, MD [facility]  If 7PM-7AM, please contact night-coverage [email or web address]  04/15/2020, 3:38 PM

## 2020-04-15 NOTE — Progress Notes (Signed)
Initial Nutrition Assessment  DOCUMENTATION CODES:   Not applicable  INTERVENTION:   Ensure Enlive po TID, each supplement provides 350 kcal and 20 grams of protein  MVI daily   NUTRITION DIAGNOSIS:   Increased nutrient needs related to catabolic illness (COVID 19) as evidenced by increased estimated needs.  GOAL:   Patient will meet greater than or equal to 90% of their needs  MONITOR:   PO intake, Supplement acceptance, Labs, Weight trends, Skin, I & O's  REASON FOR ASSESSMENT:   LOS    ASSESSMENT:   86 y.o. male with medical history significant of diabetes and hypertension who is admitted with COVID 19 PNA.  Unable to reach patient by phone. Per chart review, pt lethargic today. Pt is documented to have eaten 50% of his breakfast this morning. RD will add supplements to help pt meet his estimated needs. Pt placed on a dysphagia 2 diet as he was reported to be having trouble chewing. Per chart, pt is down 11lbs(6%) since admit; this is significant. Pt seen by palliative care 9/10 and decided to continue current measures and if patient  decompensates to transition to comfort measures.  Medications reviewed and include: insulin, ferrous fumerate, solu-medrol, MVI, thiamine  Labs reviewed: wbc- 14.6(H), Hgb 11.4(L), Hct 34.5(L), MCV 64.8(L), MCH 21.4(L) cbgs- 79, 176 x 24 hrs AIC 7.1(H)- 9/8  NUTRITION - FOCUSED PHYSICAL EXAM: Unable to perform at this time as pt with COVID 19  Diet Order:   Diet Order            DIET DYS 2 Room service appropriate? Yes; Fluid consistency: Thin  Diet effective now                EDUCATION NEEDS:   No education needs have been identified at this time  Skin:  Skin Assessment: Reviewed RN Assessment  Last BM:  9/19- TYPE 5  Height:   Ht Readings from Last 1 Encounters:  04/03/20 5\' 6"  (1.676 m)    Weight:   Wt Readings from Last 1 Encounters:  04/15/20 77.8 kg    Ideal Body Weight:  59 kg  BMI:  Body mass index  is 27.7 kg/m.  Estimated Nutritional Needs:   Kcal:  1700-1900kcal/day  Protein:  85-95g/day  Fluid:  >1.5L/day  04/17/20 MS, RD, LDN Please refer to [facility] for RD and/or RD on-call/weekend/after hours pager

## 2020-04-16 LAB — BASIC METABOLIC PANEL
Anion gap: 6 (ref 5–15)
BUN: 27 mg/dL — ABNORMAL HIGH (ref 8–23)
CO2: 24 mmol/L (ref 22–32)
Calcium: 8.2 mg/dL — ABNORMAL LOW (ref 8.9–10.3)
Chloride: 108 mmol/L (ref 98–111)
Creatinine, Ser: 1.06 mg/dL (ref 0.61–1.24)
GFR calc Af Amer: >60 mL/min (ref >60)
GFR calc non Af Amer: >60 mL/min (ref >60)
Glucose, Bld: 240 mg/dL — ABNORMAL HIGH (ref 70–99)
Potassium: 6.5 mmol/L (ref 3.5–5.1)
Sodium: 138 mmol/L (ref 135–145)

## 2020-04-16 LAB — POTASSIUM: Potassium: 5 mmol/L (ref 3.5–5.1)

## 2020-04-16 LAB — CBC
HCT: 30.7 % — ABNORMAL LOW (ref 39.0–52.0)
Hemoglobin: 10.3 g/dL — ABNORMAL LOW (ref 13.0–17.0)
MCH: 21.7 pg — ABNORMAL LOW (ref 26.0–34.0)
MCHC: 33.6 g/dL (ref 30.0–36.0)
MCV: 64.6 fL — ABNORMAL LOW (ref 80.0–100.0)
Platelets: 401 10*3/uL — ABNORMAL HIGH (ref 150–400)
RBC: 4.75 MIL/uL (ref 4.22–5.81)
RDW: 21 % — ABNORMAL HIGH (ref 11.5–15.5)
WBC: 14.1 10*3/uL — ABNORMAL HIGH (ref 4.0–10.5)
nRBC: 0 % (ref 0.0–0.2)

## 2020-04-16 LAB — GLUCOSE, CAPILLARY
Glucose-Capillary: 254 mg/dL — ABNORMAL HIGH (ref 70–99)
Glucose-Capillary: 333 mg/dL — ABNORMAL HIGH (ref 70–99)
Glucose-Capillary: 314 mg/dL — ABNORMAL HIGH (ref 70–99)
Glucose-Capillary: 187 mg/dL — ABNORMAL HIGH (ref 70–99)

## 2020-04-16 LAB — MAGNESIUM: Magnesium: 1.9 mg/dL (ref 1.7–2.4)

## 2020-04-16 LAB — C-REACTIVE PROTEIN: CRP: 3.6 mg/dL — ABNORMAL HIGH (ref <1.0)

## 2020-04-16 MED ORDER — DEXTROSE 50 % IV SOLN
50.0000 mL | Freq: Once | INTRAVENOUS | Status: AC
  Administered 2020-04-16: 50 mL via INTRAVENOUS
  Filled 2020-04-16: qty 50

## 2020-04-16 MED ORDER — SODIUM POLYSTYRENE SULFONATE 15 GM/60ML PO SUSP: 15.0000 g | Freq: Once | ORAL | Status: DC

## 2020-04-16 MED ORDER — INSULIN ASPART 100 UNIT/ML IV SOLN
10.0000 [IU] | Freq: Once | INTRAVENOUS | Status: AC
  Administered 2020-04-16: 10 [IU] via INTRAVENOUS
  Filled 2020-04-16: qty 0.1

## 2020-04-16 MED ORDER — CALCIUM GLUCONATE-NACL 1-0.675 GM/50ML-% IV SOLN
1.0000 g | Freq: Once | INTRAVENOUS | Status: AC
  Administered 2020-04-16: 1000 mg via INTRAVENOUS
  Filled 2020-04-16: qty 50

## 2020-04-16 MED ORDER — SODIUM ZIRCONIUM CYCLOSILICATE 5 G PO PACK
5.0000 g | PACK | Freq: Every day | ORAL | Status: DC
  Administered 2020-04-16 – 2020-04-17 (×2): 5 g via ORAL
  Filled 2020-04-16 (×3): qty 1

## 2020-04-16 MED ORDER — SODIUM ZIRCONIUM CYCLOSILICATE 10 G PO PACK: 10.0000 g | PACK | Freq: Two times a day (BID) | ORAL | Status: DC

## 2020-04-16 MED ORDER — INSULIN ASPART 100 UNIT/ML ~~LOC~~ SOLN
10.0000 [IU] | Freq: Three times a day (TID) | SUBCUTANEOUS | Status: DC
  Administered 2020-04-17 (×3): 10 [IU] via SUBCUTANEOUS
  Filled 2020-04-16 (×3): qty 1

## 2020-04-16 NOTE — Progress Notes (Signed)
PT Cancellation Note  Patient Details Name: Stephen Paul MRN: [id number] DOB: [birth year]   Cancelled Treatment:    Reason Eval/Treat Not Completed: Patient declined, no reason specified.  Chart reviewed.  Pt resting in bed upon PT arrival.  Pt declining therapy d/t being tired and wanting to rest instead.  Therapist attempted to educate and encourage pt to participate in therapy session but pt continued to decline.  Will re-attempt PT session at a later date/time.  Hendricks Limes, PT 04/16/20, 4:10 PM

## 2020-04-16 NOTE — Care Management Important Message (Signed)
Important Message  Patient Details  Name: Stephen Paul MRN: [id number] Date of Birth: 1931/10/20   Medicare Important Message Given:  Yes  Reviewed with spouse via phone.  Confirmed she received the copy of the Medicare IM in the mail.     Johnell Comings 04/16/2020, 10:20 AM

## 2020-04-16 NOTE — Progress Notes (Signed)
PROGRESS NOTE    Stephen Paul  NLG:[id number] DOB: 09/05/1931 DOA: 04/03/2020 PCP: Barbette Reichmann, MD   Brief Narrative: Taken from H&P Stephen Paul is a 86 y.o. male with medical history significant of diabetes and hypertension who apparently was told about a week ago that he was positive for COVID-19.  Came to ED with worsening shortness of breath.  Found to be hypoxic in 70s.  Chest x-ray consistent with COVID-19 pneumonia.  CTA negative. Patient is unvaccinated.  Subjective: Potassium came back 6.5 early this morning, received temporizing agents with insulin and dextrose.    Pt was back on non-rebreather today.     Assessment & Plan:   Principal Problem:   Acute respiratory failure due to COVID-19 [facility]) Active Problems:   Essential hypertension   Diabetes (HCC)   Goals of care, counseling/discussion   Palliative care by specialist   DNR (do not resuscitate)   Lactic acidosis   Anemia, iron deficiency   AKI (acute kidney injury) (HCC)   Chronic anticoagulation  Acute hypoxic respiratory failure secondary to COVID-19 pneumonia sepsis secondary to COVID-19 infection. Patient did meet sepsis criteria on presentation with being tachycardic, tachypneic, endorgan damage with lactic acidosis, AKI, elevated T bili and COVID-19 infection.  Patient did not receive any fluid on admission.  No cultures obtained.  Procalcitonin elevated at 0.51. UA was positive for ketones but not very impressive for any infection.  Blood cultures negative. Completed the course of remdesivir, baricitinib was given initially and discontinued due to worsening leukocytosis. D-dimer and CRP started improving.   --Completed the course of ceftriaxone and Zithromax. --Still requiring heated high flow, off non rebreather 9/20 but back on it again on 9/21 -Palliative care consulted to discuss the goals of care. --CRP trending up again, so solumedrol increased back up to 40 mg BID PLAN: --continue  solumedrol 40 mg BID --trend CRP -Continue supplemental oxygen with heated HFNC and non-rebreather -Continue supplements. --Combivent q6h  Non anion gap metabolic acidosis lactic acidosis.  Resolved.  Lactic acid remained elevated.  CK within normal limit.  No history of liver disease, liver functions normal.  Patient is hypoxic and lethargic secondary to COVID-19 pneumonia. -Continue to monitor  Anemia.   --Hgb stable between 10-11 Anemia workup showed iron def.  Folate and vit B12 wnl. --continue oral iron supplement  Type 2 diabetes mellitus.  Patient to have ketones in his urine most likely secondary to poor p.o. intake.  No anion gap.  A1c of 7.1.  Had 1 episode of hypoglycemia. PLAN: --continue linagliptin --continue Levemir 10u daily --increase meal-time to 10u TID -Continue moderate scale sliding scale.  Elevated troponin.  Mildly elevated troponin with a flat curve at 19. Most likely secondary to demand as there is no chest pain.  Hypertension.   Blood pressure from 120's-160's.  Started on amlodipine 10 mg daily (new) --continue amlodipine  AKI.   Cr peaked at 1.49.  Baseline around 1.  AKI resolved with gentle IV hydration. --encourage oral hydration  History of DVT on Eliquis Was on Eliquis at home due to history of DVT in February 2021.  CTA negative for acute PE. --continue home Eliquis  Leukocytosis Likely due to steroid.  Procal neg as of 9/16.  No other signs and symptoms of infection other than COVID.  No fever. --Monitor for now  Hyperkalemia, not POA --Potassium 6.5 this morning.  unclear the cause.  S/p insulin and dextrose.  Repeat K normalized. --Start [employer] --monitor K  Objective: Vitals:   04/16/20 0304 04/16/20 0400 04/16/20 0806 04/16/20 1156  BP: (!) 143/86 135/78 (!) 145/92 129/74  Pulse: 68 69 85 97  Resp: 20  20 18   Temp: (!) 97.5 F (36.4 C)  98.3 F (36.8 C)   TempSrc: Oral  Oral   SpO2: 100% 99% 98% 96%  Weight: 80.7 kg      Height:        Intake/Output Summary (Last 24 hours) at 04/16/2020 1524 Last data filed at 04/16/2020 1205 Gross per 24 hour  Intake 360 ml  Output 2750 ml  Net -2390 ml   Filed Weights   04/14/20 0534 04/15/20 0448 04/16/20 0304  Weight: 80.5 kg 77.8 kg 80.7 kg    Examination:  Constitutional: NAD, alert, oriented HEENT: conjunctivae and lids normal, EOMI CV: RRR no M,R,G. Distal pulses +2.  No cyanosis.   RESP: on both heated hf and non-rebreather GI: +BS, NTND Extremities: No effusions, edema in BLE SKIN: warm, dry and intact Neuro: II - XII grossly intact.  Sensation intact    DVT prophylaxis: 04/18/20 Code Status: DNR Palliative consulted Family Communication: wife updated on the phone today 04/16/20  Status is: inpatient Dispo:   The patient is from: home Anticipated d/c is to: LTAC Anticipated d/c date is: unclear Patient currently is not medically stable to d/c due to: need to wean <65% before pt can be discharged to [facility].  Not making progressive in weaning.     Consultants:   Pulmonary  Procedures:  Antimicrobials:   Data Reviewed: I have personally reviewed following labs and imaging studies  CBC: Recent Labs  Lab 04/12/20 1215 04/13/20 0752 04/14/20 0639 04/15/20 0714 04/16/20 0459  WBC 16.6* 13.5* 15.2* 14.6* 14.1*  HGB 11.0* 10.8* 11.2* 11.4* 10.3*  HCT 32.4* 32.1* 33.9* 34.5* 30.7*  MCV 64.2* 64.8* 64.4* 64.8* 64.6*  PLT 407* 396 450* 426* 401*   Basic Metabolic Panel: Recent Labs  Lab 04/12/20 1215 04/12/20 1215 04/13/20 0752 04/14/20 0639 04/15/20 0714 04/16/20 0459 04/16/20 1047  NA 138  --  136 138 142 138  --   K 5.2*   < > 5.0 4.5 5.0 6.5* 5.0  CL 107  --  106 105 107 108  --   CO2 24  --  22 24 24 24   --   GLUCOSE 168*  --  142* 124* 62* 240*  --   BUN 21  --  21 25* 21 27*  --   CREATININE 1.01  --  0.99 1.10 0.98 1.06  --   CALCIUM 8.0*  --  7.9* 8.4* 8.2* 8.2*  --   MG 1.9  --  1.9 1.9 1.9 1.9  --    < > =  values in this interval not displayed.   GFR: Estimated Creatinine Clearance: 50 mL/min (by C-G formula based on SCr of 1.06 mg/dL). Liver Function Tests: Recent Labs  Lab 04/11/20 0429  AST 36  ALT 27  ALKPHOS 65  BILITOT 0.8  PROT 6.2*  ALBUMIN 2.1*   No results for input(s): LIPASE, AMYLASE in the last 168 hours. No results for input(s): AMMONIA in the last 168 hours. Coagulation Profile: No results for input(s): INR, PROTIME in the last 168 hours. Cardiac Enzymes: No results for input(s): CKTOTAL, CKMB, CKMBINDEX, TROPONINI in the last 168 hours. BNP (last 3 results) No results for input(s): PROBNP in the last 8760 hours. HbA1C: No results for input(s): HGBA1C in the last 72 hours. CBG: Recent Labs  Lab 04/15/20 1207 04/15/20 1648 04/15/20 2107 04/16/20 0805 04/16/20 1154  GLUCAP 176* 419* 355* 254* 333*   Lipid Profile: No results for input(s): CHOL, HDL, LDLCALC, TRIG, CHOLHDL, LDLDIRECT in the last 72 hours. Thyroid Function Tests: No results for input(s): TSH, T4TOTAL, FREET4, T3FREE, THYROIDAB in the last 72 hours. Anemia Panel: No results for input(s): VITAMINB12, FOLATE, FERRITIN, TIBC, IRON, RETICCTPCT in the last 72 hours. Sepsis Labs: Recent Labs  Lab 04/11/20 0429  PROCALCITON <0.10    No results found for this or any previous visit (from the past 240 hour(s)).   Radiology Studies: No results found.  Scheduled Meds: . amLODipine  10 mg Oral Daily  . apixaban  5 mg Oral BID  . feeding supplement (ENSURE ENLIVE)  237 mL Oral TID BM  . ferrous fumarate-b12-vitamic C-folic acid  1 capsule Oral BID PC  . insulin aspart  0-15 Units Subcutaneous TID WC  . insulin aspart  5 Units Subcutaneous TID WC  . insulin detemir  10 Units Subcutaneous Daily  . Ipratropium-Albuterol  1 puff Inhalation QID  . linagliptin  5 mg Oral Daily  . mouth rinse  15 mL Mouth Rinse BID  . methylPREDNISolone (SOLU-MEDROL) injection  40 mg Intravenous Q12H  .  multivitamin with minerals  1 tablet Oral Daily  . sodium zirconium cyclosilicate  5 g Oral Daily  . thiamine  100 mg Oral Daily   Continuous Infusions:    LOS: 13 days    Darlin Priestly, MD [facility]  If 7PM-7AM, please contact night-coverage [email or web address]  04/16/2020, 3:24 PM

## 2020-04-16 NOTE — Progress Notes (Signed)
Reported K 6.5 to Reyes Ivan NP

## 2020-04-17 LAB — CBC
HCT: 32.2 % — ABNORMAL LOW (ref 39.0–52.0)
Hemoglobin: 10.5 g/dL — ABNORMAL LOW (ref 13.0–17.0)
MCH: 21.6 pg — ABNORMAL LOW (ref 26.0–34.0)
MCHC: 32.6 g/dL (ref 30.0–36.0)
MCV: 66.1 fL — ABNORMAL LOW (ref 80.0–100.0)
Platelets: 414 10*3/uL — ABNORMAL HIGH (ref 150–400)
RBC: 4.87 MIL/uL (ref 4.22–5.81)
RDW: 20.7 % — ABNORMAL HIGH (ref 11.5–15.5)
WBC: 16.2 10*3/uL — ABNORMAL HIGH (ref 4.0–10.5)
nRBC: 0 % (ref 0.0–0.2)

## 2020-04-17 LAB — GLUCOSE, CAPILLARY
Glucose-Capillary: 163 mg/dL — ABNORMAL HIGH (ref 70–99)
Glucose-Capillary: 281 mg/dL — ABNORMAL HIGH (ref 70–99)
Glucose-Capillary: 300 mg/dL — ABNORMAL HIGH (ref 70–99)
Glucose-Capillary: 289 mg/dL — ABNORMAL HIGH (ref 70–99)

## 2020-04-17 LAB — BASIC METABOLIC PANEL
Anion gap: 10 (ref 5–15)
BUN: 26 mg/dL — ABNORMAL HIGH (ref 8–23)
CO2: 20 mmol/L — ABNORMAL LOW (ref 22–32)
Calcium: 8.2 mg/dL — ABNORMAL LOW (ref 8.9–10.3)
Chloride: 103 mmol/L (ref 98–111)
Creatinine, Ser: 1.07 mg/dL (ref 0.61–1.24)
GFR calc Af Amer: >60 mL/min (ref >60)
GFR calc non Af Amer: >60 mL/min (ref >60)
Glucose, Bld: 197 mg/dL — ABNORMAL HIGH (ref 70–99)
Potassium: 4.9 mmol/L (ref 3.5–5.1)
Sodium: 133 mmol/L — ABNORMAL LOW (ref 135–145)

## 2020-04-17 LAB — PROCALCITONIN: Procalcitonin: <0.1 ng/mL

## 2020-04-17 LAB — C-REACTIVE PROTEIN: CRP: 1.3 mg/dL — ABNORMAL HIGH (ref <1.0)

## 2020-04-17 LAB — MAGNESIUM: Magnesium: 1.8 mg/dL (ref 1.7–2.4)

## 2020-04-17 MED ORDER — METHYLPREDNISOLONE SODIUM SUCC 40 MG IJ SOLR: 40.0000 mg | Freq: Every day | INTRAMUSCULAR | Status: DC

## 2020-04-17 MED ORDER — INSULIN ASPART 100 UNIT/ML ~~LOC~~ SOLN
13.0000 [IU] | Freq: Three times a day (TID) | SUBCUTANEOUS | Status: DC
  Administered 2020-04-18 (×3): 13 [IU] via SUBCUTANEOUS
  Filled 2020-04-17 (×3): qty 1

## 2020-04-17 NOTE — Progress Notes (Signed)
PROGRESS NOTE    Stephen Paul  LNL:[id number] DOB: 24-Jun-1932 DOA: 04/03/2020 PCP: Barbette Reichmann, MD   Brief Narrative: Taken from H&P Stephen Paul is a 86 y.o. male with medical history significant of diabetes and hypertension who apparently was told about a week ago that he was positive for COVID-19.  Came to ED with worsening shortness of breath.  Found to be hypoxic in 70s.  Chest x-ray consistent with COVID-19 pneumonia.  CTA negative. Patient is unvaccinated.  Subjective: Pt refused last PT attempt.  Still on heated Hf and non-rebreather together.   Assessment & Plan:   Principal Problem:   Acute respiratory failure due to COVID-19 [facility]) Active Problems:   Essential hypertension   Diabetes (HCC)   Goals of care, counseling/discussion   Palliative care by specialist   DNR (do not resuscitate)   Lactic acidosis   Anemia, iron deficiency   AKI (acute kidney injury) (HCC)   Chronic anticoagulation  Acute hypoxic respiratory failure secondary to COVID-19 pneumonia sepsis secondary to COVID-19 infection. Patient did meet sepsis criteria on presentation with being tachycardic, tachypneic, endorgan damage with lactic acidosis, AKI, elevated T bili and COVID-19 infection.  Patient did not receive any fluid on admission.  No cultures obtained.  Procalcitonin elevated at 0.51. UA was positive for ketones but not very impressive for any infection.  Blood cultures negative. Completed the course of remdesivir, baricitinib was given initially and discontinued due to worsening leukocytosis. D-dimer and CRP started improving.   --Completed the course of ceftriaxone and Zithromax. --Still requiring heated high flow, off non rebreather 9/20 but back on it again on 9/21 -Palliative care consulted to discuss the goals of care. --CRP trending up again, so solumedrol increased back up to 40 mg BID, now trending down PLAN: --taper to solumedrol 40 mg daily --trend CRP -Continue  supplemental oxygen with heated HFNC and non-rebreather -Continue supplements. --Combivent q6h  Non anion gap metabolic acidosis lactic acidosis.  Resolved.  Lactic acid remained elevated.  CK within normal limit.  No history of liver disease, liver functions normal.  Patient is hypoxic and lethargic secondary to COVID-19 pneumonia. -Continue to monitor  Anemia.   --Hgb stable between 10-11 Anemia workup showed iron def.  Folate and vit B12 wnl. --continue oral iron supplement  Type 2 diabetes mellitus.  Patient to have ketones in his urine most likely secondary to poor p.o. intake.  No anion gap.  A1c of 7.1.  Had 1 episode of hypoglycemia. --BG have been trending up, seemed to be from meals PLAN: --continue linagliptin --continue Levemir 10u daily --increase mealtime to 13u TID -Continue moderate scale sliding scale.  Elevated troponin.  Mildly elevated troponin with a flat curve at 19. Most likely secondary to demand as there is no chest pain.  Hypertension.   Blood pressure from 120's-160's.  Started on amlodipine 10 mg daily (new) --continue amlodipine  AKI.   Cr peaked at 1.49.  Baseline around 1.  AKI resolved with gentle IV hydration. --encourage oral hydration  History of DVT on Eliquis Was on Eliquis at home due to history of DVT in February 2021.  CTA negative for acute PE. --continue home Eliquis  Leukocytosis Likely due to steroid.  Procal neg as of 9/16.  No other signs and symptoms of infection other than COVID.  No fever. --Monitor for now --repeat procal --hold abx for now  Hyperkalemia, not POA --Potassium 6.5 morning of 9/21.  unclear the cause.  S/p insulin and dextrose.  Repeat K normalized.  Started on Acton --continue Lokelma for now --monitor K   Objective: Vitals:   04/17/20 0400 04/17/20 0500 04/17/20 0739 04/17/20 1158  BP: (!) 156/86  (!) 145/90 135/84  Pulse: 90  88 90  Resp: 17  16 16   Temp: 97.7 F (36.5 C) 97.7 F (36.5 C)  97.7 F (36.5 C) 98.2 F (36.8 C)  TempSrc: Axillary Axillary Axillary Axillary  SpO2: 98%  99% 100%  Weight: 77.6 kg     Height:        Intake/Output Summary (Last 24 hours) at 04/17/2020 1432 Last data filed at 04/17/2020 1159 Gross per 24 hour  Intake 240 ml  Output 1975 ml  Net -1735 ml   Filed Weights   04/15/20 0448 04/16/20 0304 04/17/20 0400  Weight: 77.8 kg 80.7 kg 77.6 kg    Examination:  Constitutional: NAD, alert, oriented HEENT: conjunctivae and lids normal, EOMI CV: RRR no M,R,G. Distal pulses +2.  No cyanosis.   RESP: on both heated hf and non-rebreather GI: +BS, NTND Extremities: No effusions, edema in BLE SKIN: warm, dry and intact Neuro: II - XII grossly intact.  Sensation intact    DVT prophylaxis: 04/19/20 Code Status: DNR Palliative consulted Family Communication:  Status is: inpatient Dispo:   The patient is from: home Anticipated d/c is to: LTAC Anticipated d/c date is: unclear Patient currently is not medically stable to d/c due to: need to wean <65% before pt can be discharged to [facility].  Not making progressive in weaning.  Still needing nonrebreather on top of heated hf   Consultants:   Pulmonary  Procedures:  Antimicrobials:   Data Reviewed: I have personally reviewed following labs and imaging studies  CBC: Recent Labs  Lab 04/13/20 0752 04/14/20 0639 04/15/20 0714 04/16/20 0459 04/17/20 0527  WBC 13.5* 15.2* 14.6* 14.1* 16.2*  HGB 10.8* 11.2* 11.4* 10.3* 10.5*  HCT 32.1* 33.9* 34.5* 30.7* 32.2*  MCV 64.8* 64.4* 64.8* 64.6* 66.1*  PLT 396 450* 426* 401* 414*   Basic Metabolic Panel: Recent Labs  Lab 04/13/20 0752 04/13/20 0752 04/14/20 0639 04/15/20 0714 04/16/20 0459 04/16/20 1047 04/17/20 0527  NA 136  --  138 142 138  --  133*  K 5.0   < > 4.5 5.0 6.5* 5.0 4.9  CL 106  --  105 107 108  --  103  CO2 22  --  24 24 24   --  20*  GLUCOSE 142*  --  124* 62* 240*  --  197*  BUN 21  --  25* 21 27*  --  26*    CREATININE 0.99  --  1.10 0.98 1.06  --  1.07  CALCIUM 7.9*  --  8.4* 8.2* 8.2*  --  8.2*  MG 1.9  --  1.9 1.9 1.9  --  1.8   < > = values in this interval not displayed.   GFR: Estimated Creatinine Clearance: 48.6 mL/min (by C-G formula based on SCr of 1.07 mg/dL). Liver Function Tests: Recent Labs  Lab 04/11/20 0429  AST 36  ALT 27  ALKPHOS 65  BILITOT 0.8  PROT 6.2*  ALBUMIN 2.1*   No results for input(s): LIPASE, AMYLASE in the last 168 hours. No results for input(s): AMMONIA in the last 168 hours. Coagulation Profile: No results for input(s): INR, PROTIME in the last 168 hours. Cardiac Enzymes: No results for input(s): CKTOTAL, CKMB, CKMBINDEX, TROPONINI in the last 168 hours. BNP (last 3 results) No results for  input(s): PROBNP in the last 8760 hours. HbA1C: No results for input(s): HGBA1C in the last 72 hours. CBG: Recent Labs  Lab 04/16/20 1154 04/16/20 1651 04/16/20 2148 04/17/20 0737 04/17/20 1156  GLUCAP 333* 314* 187* 163* 281*   Lipid Profile: No results for input(s): CHOL, HDL, LDLCALC, TRIG, CHOLHDL, LDLDIRECT in the last 72 hours. Thyroid Function Tests: No results for input(s): TSH, T4TOTAL, FREET4, T3FREE, THYROIDAB in the last 72 hours. Anemia Panel: No results for input(s): VITAMINB12, FOLATE, FERRITIN, TIBC, IRON, RETICCTPCT in the last 72 hours. Sepsis Labs: Recent Labs  Lab 04/11/20 0429  PROCALCITON <0.10    No results found for this or any previous visit (from the past 240 hour(s)).   Radiology Studies: No results found.  Scheduled Meds: . amLODipine  10 mg Oral Daily  . apixaban  5 mg Oral BID  . feeding supplement (ENSURE ENLIVE)  237 mL Oral TID BM  . ferrous fumarate-b12-vitamic C-folic acid  1 capsule Oral BID PC  . insulin aspart  0-15 Units Subcutaneous TID WC  . insulin aspart  10 Units Subcutaneous TID WC  . insulin detemir  10 Units Subcutaneous Daily  . Ipratropium-Albuterol  1 puff Inhalation QID  . linagliptin  5  mg Oral Daily  . mouth rinse  15 mL Mouth Rinse BID  . methylPREDNISolone (SOLU-MEDROL) injection  40 mg Intravenous Q12H  . multivitamin with minerals  1 tablet Oral Daily  . sodium zirconium cyclosilicate  5 g Oral Daily  . thiamine  100 mg Oral Daily   Continuous Infusions:    LOS: 14 days    Darlin Priestly, MD [facility]  If 7PM-7AM, please contact night-coverage [email or web address]  04/17/2020, 2:32 PM

## 2020-04-17 NOTE — Plan of Care (Signed)
Pt resting in bed comfortably He appears to be in no apparent distress Assessment completed as charted.  HFNC 40L 60% Pt tolerated 100% of all meals  Intermittent confusion. Pt able to reorient easily Multi attempts made to reach patient family Pt seems very upset that he can not reach family. Able to redirect at this time. Bed alarm remains in place.  Call bell within reach.  Will continue to closely monitor.   Problem: Education: Goal: Knowledge of General Education information will improve Description: Including pain rating scale, medication(s)/side effects and non-pharmacologic comfort measures Outcome: Progressing   Problem: Health Behavior/Discharge Planning: Goal: Ability to manage health-related needs will improve Outcome: Progressing   Problem: Clinical Measurements: Goal: Ability to maintain clinical measurements within normal limits will improve Outcome: Progressing Goal: Will remain free from infection Outcome: Progressing Goal: Diagnostic test results will improve Outcome: Progressing Goal: Respiratory complications will improve Outcome: Progressing Goal: Cardiovascular complication will be avoided Outcome: Progressing   Problem: Activity: Goal: Risk for activity intolerance will decrease Outcome: Progressing   Problem: Nutrition: Goal: Adequate nutrition will be maintained Outcome: Progressing   Problem: Coping: Goal: Level of anxiety will decrease Outcome: Progressing   Problem: Elimination: Goal: Will not experience complications related to bowel motility Outcome: Progressing   Problem: Pain Managment: Goal: General experience of comfort will improve Outcome: Progressing   Problem: Safety: Goal: Ability to remain free from injury will improve Outcome: Progressing   Problem: Skin Integrity: Goal: Risk for impaired skin integrity will decrease Outcome: Progressing

## 2020-04-17 NOTE — Progress Notes (Signed)
PT Cancellation Note  Patient Details Name: Stephen Paul MRN: [id number] DOB: 13-Sep-1931   Cancelled Treatment:    Reason Eval/Treat Not Completed: Patient declined, no reason specified.  Pt in bed upon PT arrival with hospital phone in his hands.  Pt perseverating on needing to make a phone call.  Therapist offered to help make a phone call for pt but pt stating that if he knew the number he would call himself.  Therapist offered to look up family member's phone number but pt then stating that they just did not answer when he called.  Attempted to distract pt and get pt to participate in therapy with different methods/strategies but pt stated he would not do anything with therapist because therapist would not help him with phone call.  Therapist again attempted to help with phone call but pt again stating that they just would not answer if he called and pt would not let therapist assist him with phone call.  Unable to get pt to participate in therapy session (nurse reporting pt has been confused today).  Will re-attempt PT treatment session at a later date/time.  Hendricks Limes, PT 04/17/20, 3:49 PM

## 2020-04-18 LAB — CBC
HCT: 31.3 % — ABNORMAL LOW (ref 39.0–52.0)
Hemoglobin: 10.1 g/dL — ABNORMAL LOW (ref 13.0–17.0)
MCH: 21.5 pg — ABNORMAL LOW (ref 26.0–34.0)
MCHC: 32.3 g/dL (ref 30.0–36.0)
MCV: 66.6 fL — ABNORMAL LOW (ref 80.0–100.0)
Platelets: 383 10*3/uL (ref 150–400)
RBC: 4.7 MIL/uL (ref 4.22–5.81)
RDW: 20.7 % — ABNORMAL HIGH (ref 11.5–15.5)
WBC: 16.5 10*3/uL — ABNORMAL HIGH (ref 4.0–10.5)
nRBC: 0 % (ref 0.0–0.2)

## 2020-04-18 LAB — BASIC METABOLIC PANEL
Anion gap: 7 (ref 5–15)
BUN: 28 mg/dL — ABNORMAL HIGH (ref 8–23)
CO2: 25 mmol/L (ref 22–32)
Calcium: 8.5 mg/dL — ABNORMAL LOW (ref 8.9–10.3)
Chloride: 104 mmol/L (ref 98–111)
Creatinine, Ser: 1.21 mg/dL (ref 0.61–1.24)
GFR calc Af Amer: >60 mL/min (ref >60)
GFR calc non Af Amer: 54 mL/min — ABNORMAL LOW (ref >60)
Glucose, Bld: 210 mg/dL — ABNORMAL HIGH (ref 70–99)
Potassium: 5.6 mmol/L — ABNORMAL HIGH (ref 3.5–5.1)
Sodium: 136 mmol/L (ref 135–145)

## 2020-04-18 LAB — GLUCOSE, CAPILLARY
Glucose-Capillary: 179 mg/dL — ABNORMAL HIGH (ref 70–99)
Glucose-Capillary: 111 mg/dL — ABNORMAL HIGH (ref 70–99)
Glucose-Capillary: 279 mg/dL — ABNORMAL HIGH (ref 70–99)
Glucose-Capillary: 303 mg/dL — ABNORMAL HIGH (ref 70–99)

## 2020-04-18 LAB — C-REACTIVE PROTEIN: CRP: 0.7 mg/dL (ref <1.0)

## 2020-04-18 LAB — MAGNESIUM: Magnesium: 2 mg/dL (ref 1.7–2.4)

## 2020-04-18 MED ORDER — INSULIN ASPART 100 UNIT/ML ~~LOC~~ SOLN
15.0000 [IU] | Freq: Three times a day (TID) | SUBCUTANEOUS | Status: DC
  Administered 2020-04-19 – 2020-04-22 (×10): 15 [IU] via SUBCUTANEOUS
  Filled 2020-04-18 (×11): qty 1

## 2020-04-18 MED ORDER — DEXAMETHASONE 4 MG PO TABS
6.0000 mg | ORAL_TABLET | Freq: Every day | ORAL | Status: DC
  Administered 2020-04-18 – 2020-04-19 (×2): 6 mg via ORAL
  Filled 2020-04-18 (×3): qty 1.5

## 2020-04-18 MED ORDER — SODIUM POLYSTYRENE SULFONATE 15 GM/60ML PO SUSP
15.0000 g | Freq: Once | ORAL | Status: AC
  Administered 2020-04-18: 15 g via ORAL
  Filled 2020-04-18: qty 60

## 2020-04-18 NOTE — Progress Notes (Signed)
PROGRESS NOTE    Stephen Paul  ZOX:[id number] DOB: 04/22/1932 DOA: 04/03/2020 PCP: Barbette Reichmann, MD   Brief Narrative: Taken from H&P Stephen Paul is a 86 y.o. male with medical history significant of diabetes and hypertension who apparently was told about a week ago that he was positive for COVID-19.  Came to ED with worsening shortness of breath.  Found to be hypoxic in 70s.  Chest x-ray consistent with COVID-19 pneumonia.  CTA negative. Patient is unvaccinated.  Subjective: Pt continued to refuse working with PT.  Unable to wean.   Assessment & Plan:   Principal Problem:   Acute respiratory failure due to COVID-19 [facility]) Active Problems:   Essential hypertension   Diabetes (HCC)   Goals of care, counseling/discussion   Palliative care by specialist   DNR (do not resuscitate)   Lactic acidosis   Anemia, iron deficiency   AKI (acute kidney injury) (HCC)   Chronic anticoagulation  Acute hypoxic respiratory failure secondary to COVID-19 pneumonia sepsis secondary to COVID-19 infection. Patient did meet sepsis criteria on presentation with being tachycardic, tachypneic, endorgan damage with lactic acidosis, AKI, elevated T bili and COVID-19 infection.  Patient did not receive any fluid on admission.  No cultures obtained.  Procalcitonin elevated at 0.51. UA was positive for ketones but not very impressive for any infection.  Blood cultures negative. Completed the course of remdesivir, baricitinib was given initially and discontinued due to worsening leukocytosis. D-dimer and CRP started improving.   --Completed the course of ceftriaxone and Zithromax. --Still requiring heated high flow, off non rebreather 9/20 but back on it again on 9/21 -Palliative care consulted to discuss the goals of care. --CRP trending up again, so solumedrol increased back up to 40 mg BID, now trending back down to normal PLAN: --continue oral decadron -Continue supplemental oxygen with heated  HFNC and non-rebreather -Continue supplements. --Combivent q6h  Non anion gap metabolic acidosis lactic acidosis.  Resolved.  Lactic acid remained elevated.  CK within normal limit.  No history of liver disease, liver functions normal.  Patient is hypoxic and lethargic secondary to COVID-19 pneumonia. -Continue to monitor  Anemia.   --Hgb stable between 10-11 Anemia workup showed iron def.  Folate and vit B12 wnl. --continue oral iron supplement  Type 2 diabetes mellitus.  Patient to have ketones in his urine most likely secondary to poor p.o. intake.  No anion gap.  A1c of 7.1.  Had 1 episode of hypoglycemia. --BG have been trending up, seemed to be from meals PLAN: --continue linagliptin --continue Levemir 10u daily --increase mealtime to 15 TID -Continue moderate scale sliding scale.  Elevated troponin.  Mildly elevated troponin with a flat curve at 19. Most likely secondary to demand as there is no chest pain.  Hypertension.   Blood pressure from 120's-160's.  Started on amlodipine 10 mg daily (new) --continue amlodipine  AKI.   Cr peaked at 1.49.  Baseline around 1.  AKI resolved with gentle IV hydration. --encourage oral hydration  History of DVT on Eliquis Was on Eliquis at home due to history of DVT in February 2021.  CTA negative for acute PE. --continue home Eliquis  Leukocytosis Likely due to steroid.  Procal neg as of 9/16.  No other signs and symptoms of infection other than COVID.  No fever. --Monitor for now --repeat procal again neg on 9/22 --Hold abx  Hyperkalemia, not POA --Potassium 6.5 morning of 9/21.  unclear the cause.  S/p insulin and dextrose.  Repeat K  normalized.  Started on [place] PLAN: --d/c [place]ma --order Kayexalate x1 today   Objective: Vitals:   04/18/20 0355 04/18/20 0400 04/18/20 0750 04/18/20 1100  BP: 128/74 133/72 138/88 139/81  Pulse: 74 63 93 98  Resp: 16 14 19 17   Temp: 98.2 F (36.8 C)  97.8 F (36.6 C) 97.8 F (36.6  C)  TempSrc: Oral  Axillary Axillary  SpO2: (!) 89% 99% 94% 97%  Weight: 74.7 kg     Height:        Intake/Output Summary (Last 24 hours) at 04/18/2020 1551 Last data filed at 04/18/2020 1135 Gross per 24 hour  Intake --  Output 3420 ml  Net -3420 ml   Filed Weights   04/16/20 0304 04/17/20 0400 04/18/20 0355  Weight: 80.7 kg 77.6 kg 74.7 kg    Examination:  Constitutional: NAD, alert, oriented HEENT: conjunctivae and lids normal, EOMI CV: RRR no M,R,G. Distal pulses +2.  No cyanosis.   RESP: on both heated hf and non-rebreather GI: +BS, NTND Extremities: No effusions, edema in BLE SKIN: warm, dry and intact Neuro: II - XII grossly intact.  Sensation intact    DVT prophylaxis: 04/20/20 Code Status: DNR Palliative consulted Family Communication:  Status is: inpatient Dispo:   The patient is from: home Anticipated d/c is to: LTAC Anticipated d/c date is: unclear Patient currently is not medically stable to d/c due to: need to wean <65% before pt can be discharged to [facility].  Not making progressive in weaning.  Still needing nonrebreather on top of heated hf   Consultants:   Pulmonary  Procedures:  Antimicrobials:   Data Reviewed: I have personally reviewed following labs and imaging studies  CBC: Recent Labs  Lab 04/14/20 0639 04/15/20 0714 04/16/20 0459 04/17/20 0527 04/18/20 0412  WBC 15.2* 14.6* 14.1* 16.2* 16.5*  HGB 11.2* 11.4* 10.3* 10.5* 10.1*  HCT 33.9* 34.5* 30.7* 32.2* 31.3*  MCV 64.4* 64.8* 64.6* 66.1* 66.6*  PLT 450* 426* 401* 414* 383   Basic Metabolic Panel: Recent Labs  Lab 04/14/20 0639 04/14/20 0639 04/15/20 0714 04/16/20 0459 04/16/20 1047 04/17/20 0527 04/18/20 0412  NA 138  --  142 138  --  133* 136  K 4.5   < > 5.0 6.5* 5.0 4.9 5.6*  CL 105  --  107 108  --  103 104  CO2 24  --  24 24  --  20* 25  GLUCOSE 124*  --  62* 240*  --  197* 210*  BUN 25*  --  21 27*  --  26* 28*  CREATININE 1.10  --  0.98 1.06  --  1.07 1.21   CALCIUM 8.4*  --  8.2* 8.2*  --  8.2* 8.5*  MG 1.9  --  1.9 1.9  --  1.8 2.0   < > = values in this interval not displayed.   GFR: Estimated Creatinine Clearance: 39.5 mL/min (by C-G formula based on SCr of 1.21 mg/dL). Liver Function Tests: No results for input(s): AST, ALT, ALKPHOS, BILITOT, PROT, ALBUMIN in the last 168 hours. No results for input(s): LIPASE, AMYLASE in the last 168 hours. No results for input(s): AMMONIA in the last 168 hours. Coagulation Profile: No results for input(s): INR, PROTIME in the last 168 hours. Cardiac Enzymes: No results for input(s): CKTOTAL, CKMB, CKMBINDEX, TROPONINI in the last 168 hours. BNP (last 3 results) No results for input(s): PROBNP in the last 8760 hours. HbA1C: No results for input(s): HGBA1C in the last 72 hours.  CBG: Recent Labs  Lab 04/17/20 1156 04/17/20 1632 04/17/20 2026 04/18/20 0748 04/18/20 1132  GLUCAP 281* 300* 289* 179* 111*   Lipid Profile: No results for input(s): CHOL, HDL, LDLCALC, TRIG, CHOLHDL, LDLDIRECT in the last 72 hours. Thyroid Function Tests: No results for input(s): TSH, T4TOTAL, FREET4, T3FREE, THYROIDAB in the last 72 hours. Anemia Panel: No results for input(s): VITAMINB12, FOLATE, FERRITIN, TIBC, IRON, RETICCTPCT in the last 72 hours. Sepsis Labs: Recent Labs  Lab 04/17/20 0527  PROCALCITON <0.10    No results found for this or any previous visit (from the past 240 hour(s)).   Radiology Studies: No results found.  Scheduled Meds:  amLODipine  10 mg Oral Daily   apixaban  5 mg Oral BID   dexamethasone  6 mg Oral Daily   feeding supplement (ENSURE ENLIVE)  237 mL Oral TID BM   ferrous fumarate-b12-vitamic C-folic acid  1 capsule Oral BID PC   insulin aspart  0-15 Units Subcutaneous TID WC   insulin aspart  13 Units Subcutaneous TID WC   insulin detemir  10 Units Subcutaneous Daily   Ipratropium-Albuterol  1 puff Inhalation QID   linagliptin  5 mg Oral Daily   mouth rinse   15 mL Mouth Rinse BID   multivitamin with minerals  1 tablet Oral Daily   thiamine  100 mg Oral Daily   Continuous Infusions:    LOS: 15 days    Darlin Priestly, MD [facility]  If 7PM-7AM, please contact night-coverage [email or web address]  04/18/2020, 3:51 PM

## 2020-04-18 NOTE — Progress Notes (Signed)
PT Cancellation Note  Patient Details Name: Stephen Paul MRN: [id number] DOB: 1932-04-12   Cancelled Treatment:    Reason Eval/Treat Not Completed: Medical issues which prohibited therapy (Chart reviewed, services held. Most recent labwork shows K+: 5.6, outside of safe range for PT services. Will resume services at later date/time once appropriate.)  3:31 PM, 04/18/20 Rosamaria Lints, PT, DPT Physical Therapist - [facility]  [phone] (ASCOM)    [name] 04/18/2020, 3:30 PM

## 2020-04-19 LAB — BASIC METABOLIC PANEL
Anion gap: 9 (ref 5–15)
BUN: 25 mg/dL — ABNORMAL HIGH (ref 8–23)
CO2: 23 mmol/L (ref 22–32)
Calcium: 8.1 mg/dL — ABNORMAL LOW (ref 8.9–10.3)
Chloride: 105 mmol/L (ref 98–111)
Creatinine, Ser: 1.01 mg/dL (ref 0.61–1.24)
GFR calc Af Amer: >60 mL/min (ref >60)
GFR calc non Af Amer: >60 mL/min (ref >60)
Glucose, Bld: 263 mg/dL — ABNORMAL HIGH (ref 70–99)
Potassium: 4.3 mmol/L (ref 3.5–5.1)
Sodium: 137 mmol/L (ref 135–145)

## 2020-04-19 LAB — CBC
HCT: 33.2 % — ABNORMAL LOW (ref 39.0–52.0)
Hemoglobin: 11.3 g/dL — ABNORMAL LOW (ref 13.0–17.0)
MCH: 21.8 pg — ABNORMAL LOW (ref 26.0–34.0)
MCHC: 34 g/dL (ref 30.0–36.0)
MCV: 64 fL — ABNORMAL LOW (ref 80.0–100.0)
Platelets: 371 10*3/uL (ref 150–400)
RBC: 5.19 MIL/uL (ref 4.22–5.81)
RDW: 21.5 % — ABNORMAL HIGH (ref 11.5–15.5)
WBC: 15.8 10*3/uL — ABNORMAL HIGH (ref 4.0–10.5)
nRBC: 0 % (ref 0.0–0.2)

## 2020-04-19 LAB — GLUCOSE, CAPILLARY
Glucose-Capillary: 232 mg/dL — ABNORMAL HIGH (ref 70–99)
Glucose-Capillary: 220 mg/dL — ABNORMAL HIGH (ref 70–99)
Glucose-Capillary: 126 mg/dL — ABNORMAL HIGH (ref 70–99)
Glucose-Capillary: 135 mg/dL — ABNORMAL HIGH (ref 70–99)

## 2020-04-19 LAB — MAGNESIUM: Magnesium: 1.8 mg/dL (ref 1.7–2.4)

## 2020-04-19 LAB — FIBRIN DERIVATIVES D-DIMER (ARMC ONLY): Fibrin derivatives D-dimer (ARMC): 1240.63 ng/mL (FEU) — ABNORMAL HIGH (ref 0.00–499.00)

## 2020-04-19 NOTE — Progress Notes (Signed)
PROGRESS NOTE    Stephen Paul  YQI:[id number] DOB: 04/17/1932 DOA: 04/03/2020 PCP: Barbette Reichmann, MD   Brief Narrative: Taken from H&P Stephen Paul is a 86 y.o. male with medical history significant of diabetes and hypertension who apparently was told about a week ago that he was positive for COVID-19.  Came to ED with worsening shortness of breath.  Found to be hypoxic in 70s.  Chest x-ray consistent with COVID-19 pneumonia.  CTA negative. Patient is unvaccinated.  Subjective: Pt continue to decline PT.  Still eating well.  Intermittently needing nonrebreather on top of heated hf.   Assessment & Plan:   Principal Problem:   Acute respiratory failure due to COVID-19 [facility]) Active Problems:   Essential hypertension   Diabetes (HCC)   Goals of care, counseling/discussion   Palliative care by specialist   DNR (do not resuscitate)   Lactic acidosis   Anemia, iron deficiency   AKI (acute kidney injury) (HCC)   Chronic anticoagulation  Acute hypoxic respiratory failure secondary to COVID-19 pneumonia sepsis secondary to COVID-19 infection. Patient did meet sepsis criteria on presentation with being tachycardic, tachypneic, endorgan damage with lactic acidosis, AKI, elevated T bili and COVID-19 infection.  Patient did not receive any fluid on admission.  No cultures obtained.  Procalcitonin elevated at 0.51. UA was positive for ketones but not very impressive for any infection.  Blood cultures negative. Completed the course of remdesivir, baricitinib was given initially and discontinued due to worsening leukocytosis. D-dimer and CRP started improving.   --Completed the course of ceftriaxone and Zithromax. --Still requiring heated high flow, off non rebreather 9/20 but back on it again on 9/21 -Palliative care consulted to discuss the goals of care. --CRP trending up again, so solumedrol increased back up to 40 mg BID, now trending back down to normal PLAN: --continue oral  decadron 6 mg daily, will need taper since pt has been on high-dose steroid for >10 days -Continue supplemental oxygen with heated HFNC and non-rebreather, wean as able -Continue supplements. --Combivent q6h  Non anion gap metabolic acidosis lactic acidosis, Resolved.  Lactic acid remained elevated.  CK within normal limit.  No history of liver disease, liver functions normal.    Anemia.   --Hgb stable between 10-11 Anemia workup showed iron def.  Folate and vit B12 wnl. --continue oral iron supplement  Type 2 diabetes mellitus.  Patient to have ketones in his urine most likely secondary to poor p.o. intake.  No anion gap.  A1c of 7.1.  Had 1 episode of hypoglycemia. --BG have been trending up, seemed to be from meals PLAN: --continue linagliptin --continue Levemir 10u daily --continue mealtime 15u TID -Continue moderate scale sliding scale.  Elevated troponin.  Mildly elevated troponin with a flat curve at 19.  This can be considered lab variance.    Hypertension.   Blood pressure from 120's-150's.  Started on amlodipine 10 mg daily (new) --continue amlodipine  AKI.   Cr peaked at 1.49.  Baseline around 1.  AKI resolved with gentle IV hydration. --encourage oral hydration  History of DVT on Eliquis Was on Eliquis at home due to history of DVT in February 2021.  CTA negative for acute PE. --continue home Eliquis  Leukocytosis Likely due to steroid.  Procal neg as of 9/16.  No other signs and symptoms of infection other than COVID.  No fever.  repeat procal again neg on 9/22 --Monitor for now and hold abx  Hyperkalemia, not POA --Potassium 6.5 morning of  9/21.  unclear the cause.  S/p insulin and dextrose.  Started on [place] which didn't help so d/c'ed.  Potassium normalized after Kayexalate x1. --Monitor K   Objective: Vitals:   04/19/20 0400 04/19/20 0756 04/19/20 1130 04/19/20 1507  BP: 135/90 (!) 150/86 129/78 127/78  Pulse: 94 95 99 94  Resp: 17 20 20 15   Temp:  98.4 F (36.9 C) 97.9 F (36.6 C) (!) 97.3 F (36.3 C) 98.7 F (37.1 C)  TempSrc: Oral Oral Oral Oral  SpO2: 91% 93% 93% 94%  Weight: 72.6 kg     Height:        Intake/Output Summary (Last 24 hours) at 04/19/2020 1617 Last data filed at 04/19/2020 1509 Gross per 24 hour  Intake --  Output 2975 ml  Net -2975 ml   Filed Weights   04/17/20 0400 04/18/20 0355 04/19/20 0400  Weight: 77.6 kg 74.7 kg 72.6 kg    Examination:  Constitutional: NAD, alert, oriented to self, but not very coherent HEENT: conjunctivae and lids normal, EOMI CV: RRR no M,R,G. Distal pulses +2.  No cyanosis.   RESP: on both heated hf and non-rebreather GI: +BS, NTND Extremities: No effusions, edema in BLE SKIN: warm, dry and intact Neuro: II - XII grossly intact.  Sensation intact    DVT prophylaxis: 04/21/20 Code Status: DNR Palliative consulted Family Communication:  Status is: inpatient Dispo:   The patient is from: home Anticipated d/c is to: LTAC Anticipated d/c date is: unclear Patient currently is not medically stable to d/c due to: need to wean <65% before pt can be discharged to [facility].  Not making progress in weaning.  Still needing nonrebreather on top of heated hf   Consultants:   Pulmonary  Procedures:  Antimicrobials:   Data Reviewed: I have personally reviewed following labs and imaging studies  CBC: Recent Labs  Lab 04/15/20 0714 04/16/20 0459 04/17/20 0527 04/18/20 0412 04/19/20 0625  WBC 14.6* 14.1* 16.2* 16.5* 15.8*  HGB 11.4* 10.3* 10.5* 10.1* 11.3*  HCT 34.5* 30.7* 32.2* 31.3* 33.2*  MCV 64.8* 64.6* 66.1* 66.6* 64.0*  PLT 426* 401* 414* 383 371   Basic Metabolic Panel: Recent Labs  Lab 04/15/20 0714 04/15/20 0714 04/16/20 0459 04/16/20 1047 04/17/20 0527 04/18/20 0412 04/19/20 0625  NA 142  --  138  --  133* 136 137  K 5.0   < > 6.5* 5.0 4.9 5.6* 4.3  CL 107  --  108  --  103 104 105  CO2 24  --  24  --  20* 25 23  GLUCOSE 62*  --  240*  --  197*  210* 263*  BUN 21  --  27*  --  26* 28* 25*  CREATININE 0.98  --  1.06  --  1.07 1.21 1.01  CALCIUM 8.2*  --  8.2*  --  8.2* 8.5* 8.1*  MG 1.9  --  1.9  --  1.8 2.0 1.8   < > = values in this interval not displayed.   GFR: Estimated Creatinine Clearance: 47.4 mL/min (by C-G formula based on SCr of 1.01 mg/dL). Liver Function Tests: No results for input(s): AST, ALT, ALKPHOS, BILITOT, PROT, ALBUMIN in the last 168 hours. No results for input(s): LIPASE, AMYLASE in the last 168 hours. No results for input(s): AMMONIA in the last 168 hours. Coagulation Profile: No results for input(s): INR, PROTIME in the last 168 hours. Cardiac Enzymes: No results for input(s): CKTOTAL, CKMB, CKMBINDEX, TROPONINI in the last 168 hours.  BNP (last 3 results) No results for input(s): PROBNP in the last 8760 hours. HbA1C: No results for input(s): HGBA1C in the last 72 hours. CBG: Recent Labs  Lab 04/18/20 1132 04/18/20 1653 04/18/20 2016 04/19/20 0754 04/19/20 1131  GLUCAP 111* 279* 303* 232* 220*   Lipid Profile: No results for input(s): CHOL, HDL, LDLCALC, TRIG, CHOLHDL, LDLDIRECT in the last 72 hours. Thyroid Function Tests: No results for input(s): TSH, T4TOTAL, FREET4, T3FREE, THYROIDAB in the last 72 hours. Anemia Panel: No results for input(s): VITAMINB12, FOLATE, FERRITIN, TIBC, IRON, RETICCTPCT in the last 72 hours. Sepsis Labs: Recent Labs  Lab 04/17/20 0527  PROCALCITON <0.10    No results found for this or any previous visit (from the past 240 hour(s)).   Radiology Studies: No results found.  Scheduled Meds: . amLODipine  10 mg Oral Daily  . apixaban  5 mg Oral BID  . dexamethasone  6 mg Oral Daily  . feeding supplement (ENSURE ENLIVE)  237 mL Oral TID BM  . ferrous fumarate-b12-vitamic C-folic acid  1 capsule Oral BID PC  . insulin aspart  0-15 Units Subcutaneous TID WC  . insulin aspart  15 Units Subcutaneous TID WC  . insulin detemir  10 Units Subcutaneous Daily  .  Ipratropium-Albuterol  1 puff Inhalation QID  . linagliptin  5 mg Oral Daily  . mouth rinse  15 mL Mouth Rinse BID  . multivitamin with minerals  1 tablet Oral Daily  . thiamine  100 mg Oral Daily   Continuous Infusions:    LOS: 16 days    Darlin Priestly, MD [facility]  If 7PM-7AM, please contact night-coverage [email or web address]  04/19/2020, 4:17 PM

## 2020-04-19 NOTE — Progress Notes (Signed)
Physical Therapy Treatment Patient Details Name: Stephen Paul MRN: [id number] DOB: Feb 15, 1932 Today's Date: 04/19/2020    History of Present Illness Murlin Schrieber is an 88yoM who comes to [facility] on 9/8 c SOB. Pt tested positive 1 week prior, but had no symptoms until recently. PMH: DM, HTN, (+) COVID test ~1 week prior to arrival. Pt moved to 55L heated highflow on 9/13.    PT Comments    Pt was supine in bed with HOB elevated ~ 25 degrees upon arriving. He agrees to PT session and is cooperative however disoriented to situation. Pt was on 40L HHF + 15 L NRB throughout session. Resting vitals: BP 136/92, sao2 94%, HR 92. He was able to exit R side of bed without assistance. Sat EOB x several minutes prior to standing and performing marching in place x 1-2 minute. Repeated this 3 x. Pt did have one occasion of de-sat to 84% with RR elevating to 38 and HR elevation to 115. He endorses no symptoms of lightheadedness or fatigue and overall tolerated session well. Pt will benefit from continued skilled PT at DC to address deficits and improve safe functional mobility.    Follow Up Recommendations  Supervision/Assistance - 24 hour;LTACH;SNF     Equipment Recommendations  Rolling walker with 5" wheels;3in1 (PT)    Recommendations for Other Services       Precautions / Restrictions Precautions Precautions: Fall Restrictions Weight Bearing Restrictions: No    Mobility  Bed Mobility Overal bed mobility: Modified Independent      General bed mobility comments: upon sitting up sao2 dropped to 89% from 94%.  Transfers Overall transfer level: Needs assistance Equipment used: Rolling walker (2 wheeled) Transfers: Sit to/from Stand Sit to Stand: Min assist    General transfer comment: pt performed standing EOB 3 x ~ 2 minute. he does desat to 84% on 40 L HHF and RR elevated to 38. After sitting and resting ~ 2 minutes, pt recovers and is eager to trial another attempt. pt unaware he is  SOB and does not endorse any symptoms of lightheadedness or fatigue.  Ambulation/Gait Ambulation/Gait assistance: Min guard Gait Distance (Feet): 0 Feet Assistive device: Rolling walker (2 wheeled) Gait Pattern/deviations: WFL(Within Functional Limits) Gait velocity: WFL   General Gait Details: pt performed static marching in place at EOB x 1-2 minutes each trial of standing. pt has good strength but is limited by respiiratory status/demand      Balance Overall balance assessment: Needs assistance Sitting-balance support: No upper extremity supported;Feet supported Sitting balance-Leahy Scale: Good Sitting balance - Comments: pt has no LOB in sitting at EOB with feet support only   Standing balance support: Bilateral upper extremity supported Standing balance-Leahy Scale: Fair Standing balance comment: reliant on Upperbody support         Cognition Arousal/Alertness: Awake/alert Behavior During Therapy: WFL for tasks assessed/performed Overall Cognitive Status: No family/caregiver present to determine baseline cognitive functioning Area of Impairment: Orientation;Attention;Memory            Orientation Level: Time;Situation Current Attention Level: Alternating;Divided Memory: Decreased recall of precautions;Decreased short-term memory         General Comments: Pt mumbles alot but was alert and able to follow co mmands consistently. He is disoriented to situation with poor insight of deficits and safety      Exercises General Exercises - Lower Extremity Ankle Circles/Pumps: AROM;10 reps Quad Sets: AROM;Both;10 reps Gluteal Sets: AROM;Both;10 reps Long Arc Quad: AROM;10 reps;Both;Seated  Pertinent Vitals/Pain Pain Assessment: No/denies pain           PT Goals (current goals can now be found in the care plan section) Acute Rehab PT Goals Patient Stated Goal: " none stated." Progress towards PT goals: Progressing toward goals    Frequency    Min  2X/week      PT Plan Current plan remains appropriate       AM-PAC PT "6 Clicks" Mobility   Outcome Measure  Help needed turning from your back to your side while in a flat bed without using bedrails?: A Little Help needed moving from lying on your back to sitting on the side of a flat bed without using bedrails?: A Little Help needed moving to and from a bed to a chair (including a wheelchair)?: A Little Help needed standing up from a chair using your arms (e.g., wheelchair or bedside chair)?: A Little Help needed to walk in hospital room?: A Lot Help needed climbing 3-5 steps with a railing? : A Lot 6 Click Score: 16    End of Session Equipment Utilized During Treatment: Oxygen (40 L HHF + NRB 15L) Activity Tolerance: Patient limited by fatigue Patient left: in bed;with call bell/phone within reach;with bed alarm set Nurse Communication: Mobility status PT Visit Diagnosis: Other abnormalities of gait and mobility (R26.89)     Time: 8338-2505 PT Time Calculation (min) (ACUTE ONLY): 18 min  Charges:  $Therapeutic Activity: 8-22 mins                     Jetta Lout PTA 04/19/20, 1:01 PM

## 2020-04-19 NOTE — Progress Notes (Signed)
Patient verbalizing frustration of being in hospital and having nobody to laugh and talk with. Verbalized frustration with inability to get in contact with family. Attempted to call patients wife twice. Unable to leave voicemail. Patient given emotional comfort

## 2020-04-19 NOTE — Progress Notes (Addendum)
Inpatient Diabetes Program Recommendations  AACE/ADA: New Consensus Statement on Inpatient Glycemic Control (2015)  Target Ranges:  Prepandial:   less than 140 mg/dL      Peak postprandial:   less than 180 mg/dL (1-2 hours)      Critically ill patients:  140 - 180 mg/dL   Results for LYNK, MARTI (MRN [id number]) as of 04/19/2020 14:53  Ref. Range 04/18/2020 07:48 04/18/2020 11:32 04/18/2020 16:53 04/18/2020 20:16  Glucose-Capillary Latest Ref Range: 70 - 99 mg/dL 536 (H)  16 units NOVOLOG  111 (H)  13 units NOVOLOG +  10 units LEVEMIR  279 (H)  21 units NOVOLOG  303 (H)   Results for ERSEL, ENSLIN (MRN [id number]) as of 04/19/2020 14:53  Ref. Range 04/19/2020 07:54 04/19/2020 11:31  Glucose-Capillary Latest Ref Range: 70 - 99 mg/dL 595 (H)  20 units NOVOLOG +  10 units LEVEMIR  220 (H)  20 units NOVOLOG     Outpatient DM Meds:Glipizide 10 mg Daily                Metformin 1000 mg BID   Current Insulin Orders:Novolog 0-15units TID      Novolog 15 units TID with meals      Levemir 10 units Daily        Tradjenta 5 mg QD     Increased Novolog Meal Coverage to 15 units TID this AM.   Added back Decadron yest AM so now CBGs are elevated likely due to the Decadron.    MD- If patient to remain on Decadron for several days, please consider the following:  1. Increase Levemir to 13 units Daily  2. Increase the Novolog Meal Coverage to 18 units TID with meals    --Will follow patient during hospitalization--  Ambrose Finland RN, MSN, CDE Diabetes Coordinator Inpatient Glycemic Control Team Team Pager: [phone] (8a-5p)

## 2020-04-20 LAB — GLUCOSE, CAPILLARY
Glucose-Capillary: 173 mg/dL — ABNORMAL HIGH (ref 70–99)
Glucose-Capillary: 314 mg/dL — ABNORMAL HIGH (ref 70–99)
Glucose-Capillary: 212 mg/dL — ABNORMAL HIGH (ref 70–99)
Glucose-Capillary: 216 mg/dL — ABNORMAL HIGH (ref 70–99)
Glucose-Capillary: 35 mg/dL — CL (ref 70–99)
Glucose-Capillary: 174 mg/dL — ABNORMAL HIGH (ref 70–99)

## 2020-04-20 LAB — CBC
HCT: 33.3 % — ABNORMAL LOW (ref 39.0–52.0)
Hemoglobin: 11.6 g/dL — ABNORMAL LOW (ref 13.0–17.0)
MCH: 22.2 pg — ABNORMAL LOW (ref 26.0–34.0)
MCHC: 34.8 g/dL (ref 30.0–36.0)
MCV: 63.7 fL — ABNORMAL LOW (ref 80.0–100.0)
Platelets: 347 10*3/uL (ref 150–400)
RBC: 5.23 MIL/uL (ref 4.22–5.81)
RDW: 21.5 % — ABNORMAL HIGH (ref 11.5–15.5)
WBC: 16.8 10*3/uL — ABNORMAL HIGH (ref 4.0–10.5)
nRBC: 0 % (ref 0.0–0.2)

## 2020-04-20 LAB — BASIC METABOLIC PANEL
Anion gap: 10 (ref 5–15)
BUN: 25 mg/dL — ABNORMAL HIGH (ref 8–23)
CO2: 22 mmol/L (ref 22–32)
Calcium: 8.3 mg/dL — ABNORMAL LOW (ref 8.9–10.3)
Chloride: 107 mmol/L (ref 98–111)
Creatinine, Ser: 1.13 mg/dL (ref 0.61–1.24)
GFR calc Af Amer: >60 mL/min (ref >60)
GFR calc non Af Amer: 59 mL/min — ABNORMAL LOW (ref >60)
Glucose, Bld: 149 mg/dL — ABNORMAL HIGH (ref 70–99)
Potassium: 4.5 mmol/L (ref 3.5–5.1)
Sodium: 139 mmol/L (ref 135–145)

## 2020-04-20 LAB — MAGNESIUM: Magnesium: 1.8 mg/dL (ref 1.7–2.4)

## 2020-04-20 MED ORDER — DEXTROSE 50 % IV SOLN
INTRAVENOUS | Status: AC
  Filled 2020-04-20: qty 50

## 2020-04-20 MED ORDER — DEXTROSE 50 % IV SOLN
25.0000 g | INTRAVENOUS | Status: AC
  Administered 2020-04-20: 25 g via INTRAVENOUS

## 2020-04-20 MED ORDER — DEXAMETHASONE 4 MG PO TABS
4.0000 mg | ORAL_TABLET | Freq: Every day | ORAL | Status: DC
  Administered 2020-04-20 – 2020-04-21 (×2): 4 mg via ORAL
  Filled 2020-04-20 (×2): qty 1

## 2020-04-20 NOTE — Progress Notes (Signed)
D50 25gms given for cbg of 35.  Will recheck in 15 minutes.

## 2020-04-20 NOTE — Progress Notes (Signed)
cbg now 216.  Pt also drank OJ.  Pt resting quietly in bed and answers questions appropriately.

## 2020-04-20 NOTE — Progress Notes (Signed)
[facility]  -  at [facility]   PATIENT NAME: Stephen Paul    MR#:  [id number]  DATE OF BIRTH:  1931-12-13  SUBJECTIVE:   Patient very upset because he found out someone has deducted money from his bank account. He is not able to focus on his health or any other question I'm asking.  Remains on high flow nasal cannula off non-rebreather for now  REVIEW OF SYSTEMS:   Review of Systems  Constitutional: Negative for chills, fever and weight loss.  HENT: Negative for ear discharge, ear pain and nosebleeds.   Eyes: Negative for blurred vision, pain and discharge.  Respiratory: Positive for shortness of breath. Negative for sputum production, wheezing and stridor.   Cardiovascular: Negative for chest pain, palpitations, orthopnea and PND.  Gastrointestinal: Negative for abdominal pain, diarrhea, nausea and vomiting.  Genitourinary: Negative for frequency and urgency.  Musculoskeletal: Negative for back pain and joint pain.  Neurological: Negative for sensory change, speech change and focal weakness.  Psychiatric/Behavioral: Negative for depression and hallucinations. The patient is not nervous/anxious.    Tolerating Diet:yes Tolerating PT:   DRUG ALLERGIES:   Allergies  Allergen Reactions  . Mycophenolate Mofetil Rash    VITALS:  Blood pressure 132/89, pulse (!) 108, temperature 97.7 F (36.5 C), temperature source Axillary, resp. rate (!) 26, height 5\' 6"  (1.676 m), weight 72.6 kg, SpO2 (!) 88 %.  PHYSICAL EXAMINATION:   Physical Exam  GENERAL:  86 y.o.-year-old patient lying in the bed with mild acute distress.  LUNGS: distant breath sounds bilaterally, no wheezing, rales, rhonchi. No use of accessory muscles of respiration.  CARDIOVASCULAR: S1, S2 normal. No murmurs, rubs, or gallops.  ABDOMEN: Soft, nontender, nondistended. Bowel sounds present. No organomegaly or mass.  EXTREMITIES: No cyanosis, clubbing or edema b/l.    NEUROLOGIC: Cranial  nerves II through XII are intact. No focal Motor or sensory deficits b/l.  weak PSYCHIATRIC:  patient is alert and oriented x 3.  SKIN: No obvious rash, lesion, or ulcer.  Per RN  LABORATORY PANEL:  CBC Recent Labs  Lab 04/20/20 0438  WBC 16.8*  HGB 11.6*  HCT 33.3*  PLT 347    Chemistries  Recent Labs  Lab 04/20/20 0438  NA 139  K 4.5  CL 107  CO2 22  GLUCOSE 149*  BUN 25*  CREATININE 1.13  CALCIUM 8.3*  MG 1.8   Cardiac Enzymes No results for input(s): TROPONINI in the last 168 hours. RADIOLOGY:  No results found. ASSESSMENT AND PLAN:  Stephen Paul a 44 y.o.malewith medical history significant ofdiabetes and hypertension who apparently was told about a week ago that he was positive for COVID-19.  Came to ED with worsening shortness of breath.  Found to be hypoxic in 70s.  Chest x-ray consistent with COVID-19 pneumonia.  CTA negative. Patient is unvaccinated.  Acute hypoxic respiratory failure secondary to COVID-19 pneumonia sepsis secondary to COVID-19 infection. -Sepsis resolved Patient did meet sepsis criteria on presentation with being tachycardic, tachypneic, endorgan damage with lactic acidosis, AKI, elevated T bili and COVID-19 infection.   -Patient did not receive any fluid on admission.  No cultures obtained. - Procalcitonin elevated at 0.51. UA was positive for ketones but not very impressive for any infection.  Blood cultures negative.  -Completed the course of remdesivir, baricitinib was given initially and discontinued due to worsening leukocytosis.  -D-dimer and CRP started improving.   --Completed the course of ceftriaxone and Zithromax. --Still requiring heated high flow, off  non rebreather 9/20 but back on it again on 9/21 -Palliative care consulted to discuss the goals of care.--DNR --CRP trending up again, so solumedrol increased back up to 40 mg BID, now trending back down to normal --continue oral decadron 6 mg daily, will need taper  since pt has been on high-dose steroid for >10 days -Continue supplemental oxygen with heated HFNC  wean as able to keep sats >85% -Continue supplements. --Combivent q6h  Non anion gap metabolic acidosis lactic acidosis, Resolved.  Lactic acid remained elevated.  CK within normal limit.  No history of liver disease, liver functions normal.    Anemia.   --Hgb stable between 10-11 Anemia workup showed iron def.  Folate and vit B12 wnl. --continue oral iron supplement  Type 2 diabetes mellitus.  Patient to have ketones in his urine most likely secondary to poor p.o. intake.  No anion gap.  A1c of 7.1.  Had 1 episode of hypoglycemia. --BG have been trending up, seemed to be from meals --continue linagliptin --continue Levemir 10u daily --continue Aspart  mealtime 15u TID -Continue moderate scale sliding scale.  Hypertension.   Blood pressure from 120's-150's.  Started on amlodipine 10 mg daily (new) --continue amlodipine  AKI.   -resolved Cr peaked at 1.49.  Baseline around 1.  AKI resolved with gentle IV hydration. --encourage oral hydration  History of DVT on Eliquis Was on Eliquis at home due to history of DVT in February 2021.  CTA negative for acute PE. --continue home Eliquis  Leukocytosis Likely due to steroid.  Procal neg as of 9/16.  No other signs and symptoms of infection other than COVID.  No fever.  repeat procal again neg on 9/22 --Monitor for now and hold abx  Hyperkalemia, not POA --K 4.5 improved   DVT prophylaxis: UK:GURKYHC Code Status: DNR Palliative consulted Family Communication:  Status is: inpatient Dispo:   The patient is from: home Anticipated d/c is to: LTAC Anticipated d/c date is: unclear Patient currently is not medically stable to d/c due to: need to wean <65% before pt can be discharged to [facility].  Not making progress in weaning.  Still needing intermittent  nonrebreather on top of heated hf      TOTAL TIME TAKING CARE OF THIS  PATIENT: *25* minutes.  >50% time spent on counselling and coordination of care  Note: This dictation was prepared with Dragon dictation along with smaller phrase technology. Any transcriptional errors that result from this process are unintentional.  Enedina Finner M.D    [facility]   CC: Primary care physician; Barbette Reichmann, MDPatient ID: Dian Situ, male   DOB: 1931-10-02, 86 y.o.   MRN: [id number]

## 2020-04-20 NOTE — Progress Notes (Signed)
   04/20/20 1542  Assess: MEWS Score  Pulse Rate (!) 101  ECG Heart Rate (!) 101  Resp (!) 23  SpO2 97 %  Assess: MEWS Score  MEWS Temp 0  MEWS Systolic 0  MEWS Pulse 1  MEWS RR 1  MEWS LOC 0  MEWS Score 2  MEWS Score Color Yellow  Assess: if the MEWS score is Yellow or Red  Were vital signs taken at a resting state? Yes  Focused Assessment No change from prior assessment  Early Detection of Sepsis Score *See Row Information* Low  MEWS guidelines implemented *See Row Information* No, previously yellow, continue vital signs every 4 hours

## 2020-04-20 NOTE — Progress Notes (Signed)
CBG now 174 1 hour after D50.

## 2020-04-21 LAB — GLUCOSE, CAPILLARY
Glucose-Capillary: 169 mg/dL — ABNORMAL HIGH (ref 70–99)
Glucose-Capillary: 175 mg/dL — ABNORMAL HIGH (ref 70–99)
Glucose-Capillary: 260 mg/dL — ABNORMAL HIGH (ref 70–99)
Glucose-Capillary: 225 mg/dL — ABNORMAL HIGH (ref 70–99)
Glucose-Capillary: 110 mg/dL — ABNORMAL HIGH (ref 70–99)

## 2020-04-21 MED ORDER — DEXAMETHASONE 4 MG PO TABS
2.0000 mg | ORAL_TABLET | Freq: Every day | ORAL | Status: DC
  Administered 2020-04-22 – 2020-04-24 (×3): 2 mg via ORAL
  Filled 2020-04-21 (×3): qty 0.5

## 2020-04-21 NOTE — Progress Notes (Signed)
[facility]  - [place] [facility]   PATIENT NAME: Stephen Paul    MR#:  [id number]  DATE OF BIRTH:  09-24-1931  SUBJECTIVE:  patient appears very frustrated this morning. He is upset about not able to go home. Complaining of some headache. Does not want to eat no respiratory distress noted Remains on Heated high flow nasal cannula-- sats are hundred percent. No adjustment has been made in his oxygen requirement since yesterday   REVIEW OF SYSTEMS:   Review of Systems  Constitutional: Negative for chills, fever and weight loss.  HENT: Negative for ear discharge, ear pain and nosebleeds.   Eyes: Negative for blurred vision, pain and discharge.  Respiratory: Positive for shortness of breath. Negative for sputum production, wheezing and stridor.   Cardiovascular: Negative for chest pain, palpitations, orthopnea and PND.  Gastrointestinal: Negative for abdominal pain, diarrhea, nausea and vomiting.  Genitourinary: Negative for frequency and urgency.  Musculoskeletal: Negative for back pain and joint pain.  Neurological: Negative for sensory change, speech change and focal weakness.  Psychiatric/Behavioral: Negative for depression and hallucinations. The patient is not nervous/anxious.    Tolerating Diet:yes Tolerating PT: HH  DRUG ALLERGIES:   Allergies  Allergen Reactions   Mycophenolate Mofetil Rash    VITALS:  Blood pressure 117/75, pulse 86, temperature 98.1 F (36.7 C), temperature source Axillary, resp. rate 17, height 5\' 6"  (1.676 m), weight 72.2 kg, SpO2 97 %.  PHYSICAL EXAMINATION:   Physical Exam  GENERAL:  86 y.o.-year-old patient lying in the bed with mild acute distress.  LUNGS: distant breath sounds bilaterally, no wheezing, rales, rhonchi. No use of accessory muscles of respiration.  CARDIOVASCULAR: S1, S2 normal. No murmurs, rubs, or gallops.  ABDOMEN: Soft, nontender, nondistended. Bowel sounds present. No organomegaly or mass.   EXTREMITIES: No cyanosis, clubbing or edema b/l.    NEUROLOGIC: Cranial nerves II through XII are intact. No focal Motor or sensory deficits b/l.  weak PSYCHIATRIC:  patient is alert and oriented x 3.  SKIN: No obvious rash, lesion, or ulcer.  Per RN  LABORATORY PANEL:  CBC Recent Labs  Lab 04/20/20 0438  WBC 16.8*  HGB 11.6*  HCT 33.3*  PLT 347    Chemistries  Recent Labs  Lab 04/20/20 0438  NA 139  K 4.5  CL 107  CO2 22  GLUCOSE 149*  BUN 25*  CREATININE 1.13  CALCIUM 8.3*  MG 1.8   Cardiac Enzymes No results for input(s): TROPONINI in the last 168 hours. RADIOLOGY:  No results found. ASSESSMENT AND PLAN:  EPIMENIO SCHETTER a 86 y.o.malewith medical history significant ofdiabetes and hypertension who apparently was told about a week ago that he was positive for COVID-19.  Came to ED with worsening shortness of breath.  Found to be hypoxic in 70s.  Chest x-ray consistent with COVID-19 pneumonia.  CTA negative. Patient is unvaccinated.  Acute hypoxic respiratory failure secondary to COVID-19 pneumonia sepsis secondary to COVID-19 infection. -Sepsis resolved Patient did meet sepsis criteria on presentation with being tachycardic, tachypneic, endorgan damage with lactic acidosis, AKI, elevated T bili and COVID-19 infection.   -Patient did not receive any fluid on admission.  No cultures obtained. - Procalcitonin elevated at 0.51. UA was positive for ketones but not very impressive for any infection.  Blood cultures negative.  -Completed the course of remdesivir, baricitinib was given initially and discontinued due to worsening leukocytosis.  -D-dimer and CRP mproving.   --Completed the course of ceftriaxone and Zithromax. --Still  requiring heated high flow, off non rebreather 9/25 --9/26 On Heated HFNC 40 l/min, 52% FIO2  -Palliative care consulted to discuss the goals of care.--DNR --CRP trending up again, so solumedrol increased back up to 40 mg BID, now  trending back down to normal --continue oral decadron 6 mg daily, will need taper since pt has been on high-dose steroid for >10 days -Continue supplemental oxygen with heated HFNC  wean as able to keep sats >85% --Combivent q6h  Non anion gap metabolic acidosis lactic acidosis, Resolved.  Lactic acid remained elevated.  CK within normal limit.  No history of liver disease, liver functions normal.    Anemia.   --Hgb stable between 10-11 Anemia workup showed iron def.  Folate and vit B12 wnl. --continue oral iron supplement  Type 2 diabetes mellitus.  Patient to have ketones in his urine most likely secondary to poor p.o. intake.  No anion gap.  A1c of 7.1.  Had 1 episode of hypoglycemia. --BG have been trending up, seemed to be from meals --continue linagliptin --continue Levemir 10u daily --continue Aspart  mealtime 15u TID -Continue moderate scale sliding scale.  Hypertension.   Blood pressure from 120's-150's.  Started on amlodipine 10 mg daily (new) --continue amlodipine  AKI.   -resolved Cr peaked at 1.49.  Baseline around 1.  AKI resolved with gentle IV hydration. --encourage oral hydration  History of DVT on Eliquis Was on Eliquis at home due to history of DVT in February 2021.  CTA negative for acute PE. --continue home Eliquis  Leukocytosis Likely due to steroid.  Procal neg as of 9/16.  No other signs and symptoms of infection other than COVID.  No fever.  repeat procal again neg on 9/22 --Monitor for now and hold abx  Hyperkalemia, not POA --K 4.5 improved   DVT prophylaxis: TM:[id number] Code Status: DNR Palliative consulted Family Communication: wife Status is: inpatient Dispo:   The patient is from: home Anticipated d/c is to: LTAC Anticipated d/c date is: unclear Patient currently is not medically stable to d/c due to: need to wean <65% before pt can be discharged to [facility].  Not making progress in weaning.  Still needing intermittent  nonrebreather  on top of heated hf      TOTAL TIME TAKING CARE OF THIS PATIENT: *25* minutes.  >50% time spent on counselling and coordination of care  Note: This dictation was prepared with Dragon dictation along with smaller phrase technology. Any transcriptional errors that result from this process are unintentional.  Enedina Finner M.D    [facility]   CC: Primary care physician; Barbette Reichmann, MDPatient ID: Dian Situ, male   DOB: April 24, 1932, 86 y.o.   MRN: [id number]

## 2020-04-22 LAB — GLUCOSE, CAPILLARY
Glucose-Capillary: 144 mg/dL — ABNORMAL HIGH (ref 70–99)
Glucose-Capillary: 193 mg/dL — ABNORMAL HIGH (ref 70–99)
Glucose-Capillary: 56 mg/dL — ABNORMAL LOW (ref 70–99)
Glucose-Capillary: 132 mg/dL — ABNORMAL HIGH (ref 70–99)
Glucose-Capillary: 53 mg/dL — ABNORMAL LOW (ref 70–99)
Glucose-Capillary: 81 mg/dL (ref 70–99)
Glucose-Capillary: 124 mg/dL — ABNORMAL HIGH (ref 70–99)

## 2020-04-22 MED ORDER — DEXTROSE 50 % IV SOLN: 50.0000 mL | Freq: Once | INTRAVENOUS | Status: AC

## 2020-04-22 MED ORDER — DEXTROSE 50 % IV SOLN
INTRAVENOUS | Status: AC
  Administered 2020-04-22: 50 mL
  Filled 2020-04-22: qty 50

## 2020-04-22 NOTE — Progress Notes (Signed)
Wife Marily Memos updated over the phone.

## 2020-04-22 NOTE — Progress Notes (Signed)
PT Cancellation Note  Patient Details Name: Stephen Paul MRN: [id number] DOB: 25-Jan-1932   Cancelled Treatment:    Reason Eval/Treat Not Completed: Medical issues which prohibited therapy (Chart reviewed for planned treatment session.  Respiratory preparing to enter room for O2 adjustment, planning transition to heated HFNC due to continued desaturation.  Will continue efforts at later time/date as medically appropriate and available.)   [name] H. Manson Passey, PT, DPT, NCS 04/22/20, 10:45 AM [phone]

## 2020-04-22 NOTE — Progress Notes (Signed)
Nutrition Follow-up  DOCUMENTATION CODES:   Not applicable  INTERVENTION:  D/c Ensure Enlive, pt refusing  2x protein serving TID all meals  Continue MVI daily   NUTRITION DIAGNOSIS:   Increased nutrient needs related to catabolic illness (COVID 19) as evidenced by estimated needs.  Ongoing  GOAL:   Patient will meet greater than or equal to 90% of their needs  Progressing  MONITOR:   PO intake, Supplement acceptance, Labs, Weight trends, Skin, I & O's  REASON FOR ASSESSMENT:   LOS    ASSESSMENT:   86 y.o. male with medical history significant of diabetes and hypertension who is admitted with COVID 19 PNA.  Pt is off of nonrebreather. Pt on heated high flow.   Pt noted to be frustrated about not being able to go home.   Pt is eating well, but has been refusing Ensure per RN (ordered TID). Will d/c Ensure and order double protein portions with all meals to aid in meeting protein/kcal needs.  PO Intake: 0-100% x last 8 recorded meals (75% average meal intake)  Labs: CBGs 110-144 Medications: Decadron, Trinsicon, Novolog, Levemir, Tradjenta, MVI, Thiamine  Diet Order:   Diet Order            DIET DYS 2 Room service appropriate? Yes; Fluid consistency: Thin  Diet effective now                 EDUCATION NEEDS:   No education needs have been identified at this time  Skin:  Skin Assessment: Reviewed RN Assessment  Last BM:  9/26  Height:   Ht Readings from Last 1 Encounters:  04/03/20 5\' 6"  (1.676 m)    Weight:   Wt Readings from Last 1 Encounters:  04/22/20 70.4 kg    Ideal Body Weight:  59 kg  BMI:  Body mass index is 25.05 kg/m.  Estimated Nutritional Needs:   Kcal:  1700-1900kcal/day  Protein:  85-95g/day  Fluid:  >1.5L/day    03/31/2020, MS, RD, LDN RD pager number and weekend/on-call pager number located in Amion.

## 2020-04-22 NOTE — Progress Notes (Signed)
[facility]  - [place] at [facility]   PATIENT NAME: Stephen Paul    MR#:  [id number]  DATE OF BIRTH:  1932-04-02  SUBJECTIVE:  Remains on Heated high flow nasal cannula and now added NRB back due to desaturations  no improvement in weaning with oxygen REVIEW OF SYSTEMS:   Review of Systems  Constitutional: Negative for chills, fever and weight loss.  HENT: Negative for ear discharge, ear pain and nosebleeds.   Eyes: Negative for blurred vision, pain and discharge.  Respiratory: Positive for shortness of breath. Negative for sputum production, wheezing and stridor.   Cardiovascular: Negative for chest pain, palpitations, orthopnea and PND.  Gastrointestinal: Negative for abdominal pain, diarrhea, nausea and vomiting.  Genitourinary: Negative for frequency and urgency.  Musculoskeletal: Negative for back pain and joint pain.  Neurological: Negative for sensory change, speech change and focal weakness.  Psychiatric/Behavioral: Negative for depression and hallucinations. The patient is not nervous/anxious.    Tolerating Diet:yes Tolerating PT: not much due to significant hypoxia on min exertion  DRUG ALLERGIES:   Allergies  Allergen Reactions  . Mycophenolate Mofetil Rash    VITALS:  Blood pressure 132/72, pulse 82, temperature 97.8 F (36.6 C), temperature source Axillary, resp. rate 16, height 5\' 6"  (1.676 m), weight 70.4 kg, SpO2 99 %.  PHYSICAL EXAMINATION:   Physical Exam  GENERAL:  86 y.o.-year-old patient lying in the bed with mild acute distress.  Deconditioned, weak LUNGS: distant breath sounds bilaterally, no wheezing, rales, rhonchi. No use of accessory muscles of respiration.  CARDIOVASCULAR: S1, S2 normal. No murmurs, rubs, or gallops.  ABDOMEN: Soft, nontender, nondistended. Bowel sounds present. No organomegaly or mass.  EXTREMITIES: No cyanosis, clubbing or edema b/l.    NEUROLOGIC: Cranial nerves II through XII are intact. No focal Motor  or sensory deficits b/l.  weak PSYCHIATRIC:  patient is alert and oriented x 2 SKIN: No obvious rash, lesion, or ulcer.  Per RN  LABORATORY PANEL:  CBC Recent Labs  Lab 04/20/20 0438  WBC 16.8*  HGB 11.6*  HCT 33.3*  PLT 347    Chemistries  Recent Labs  Lab 04/20/20 0438  NA 139  K 4.5  CL 107  CO2 22  GLUCOSE 149*  BUN 25*  CREATININE 1.13  CALCIUM 8.3*  MG 1.8   Cardiac Enzymes No results for input(s): TROPONINI in the last 168 hours. RADIOLOGY:  No results found. ASSESSMENT AND PLAN:  Stephen Paul a 86 y.o.malewith medical history significant ofdiabetes and hypertension who apparently was told about a week ago that he was positive for COVID-19.  Came to ED with worsening shortness of breath.  Found to be hypoxic in 70s.  Chest x-ray consistent with COVID-19 pneumonia.  CTA negative. Patient is unvaccinated.  Acute hypoxic respiratory failure secondary to COVID-19 pneumonia sepsis secondary to COVID-19 infection. -Sepsis resolved Patient did meet sepsis criteria on presentation with being tachycardic, tachypneic, endorgan damage with lactic acidosis, AKI, elevated T bili and COVID-19 infection.   -Patient did not receive any fluid on admission.  No cultures obtained. - Procalcitonin elevated at 0.51. UA was positive for ketones but not very impressive for any infection.  Blood cultures negative.  -Completed the course of remdesivir, baricitinib was given initially and discontinued due to worsening leukocytosis.  --Completed the course of ceftriaxone and Zithromax. --Still requiring heated high flow, off non rebreather  --9/26 On Heated HFNC 40 l/min, 52% FIO2  -9/27-- HFNC/Heateed 100% and 50 L/min -Palliative care consulted to  discuss the goals of care.--DNR --CRP trending up again, so solumedrol increased back up to 40 mg BID, now trending back down to normal --continue oral decadron 6 mg daily, will need taper since pt has been on high-dose steroid  for >10 days --Continue supplemental oxygen with heated HFNC and NRB  wean as able to keep sats >85%--weaning has been quiet challenging!! --Combivent q6h -lasix 40 mg x1 today -9/27--cxr for am  Non anion gap metabolic acidosis lactic acidosis, Resolved.  Lactic acid remained elevated.  CK within normal limit.  No history of liver disease, liver functions normal.    Anemia.   --Hgb stable between 10-11 Anemia workup showed iron def.  Folate and vit B12 wnl. --continue oral iron supplement  Type 2 diabetes mellitus.  Patient to have ketones in his urine most likely secondary to poor p.o. intake.  No anion gap.  A1c of 7.1.  Had 1 episode of hypoglycemia. --BG have been trending up, seemed to be from meals --continue linagliptin, Levemir 10u daily, Aspart  mealtime 15u TID -Continue moderate scale sliding scale.  Hypertension.   Blood pressure from 120's-150's.   -Started on amlodipine 10 mg daily (new)  AKI.   -resolved Cr peaked at 1.49.  Baseline around 1.   --encourage oral hydration  History of DVT on Eliquis Was on Eliquis at home due to history of DVT in February 2021.  CTA negative for acute PE. --continue home Eliquis  Leukocytosis Likely due to steroid.  Procal neg as of 9/16.  No other signs and symptoms of infection other than COVID.  No fever.  repeat procal again neg on 9/22 --Monitor for now and hold abx  Hyperkalemia, not POA --K 4.5 improved   DVT prophylaxis: [id number] Code Status: DNR Palliative consulted Family Communication: wife on the phone today. Mentioned about LTAC discharge. she will d/w husband  Status is: inpatient Dispo:   The patient is from: home Anticipated d/c is to: ?LTAC Anticipated d/c date is: unclear Patient currently is not medically stable to d/c due to: need to wean <65% before pt can be discharged to [facility].  Not making progress in weaning.  Still needing intermittent  nonrebreather on top of heated hf TOC for d/c  planning  TOTAL TIME TAKING CARE OF THIS PATIENT: *25* minutes.  >50% time spent on counselling and coordination of care  Note: This dictation was prepared with Dragon dictation along with smaller phrase technology. Any transcriptional errors that result from this process are unintentional.  Enedina Finner M.D    [facility]   CC: Primary care physician; Barbette Reichmann, MDPatient ID: Dian Situ, male   DOB: [birth year], 86 y.o.   MRN: [id number]

## 2020-04-22 NOTE — Progress Notes (Signed)
Hypoglycemic Event  CBG: 56  Treatment: 4oz of juice and dinner   Symptoms: asymptomatic   Follow-up CBG: Time:1752 CBG Result:53   Possible Reasons for Event: low po intake today   Comments/MD notified:none    Stephen Paul

## 2020-04-22 NOTE — Progress Notes (Signed)
Hypoglycemic Event  CBG: 53  Treatment:  25 grams of 50% Dextrose IV   Symptoms: none  Follow-up CBG: Time: 1815 CBG Result:132  Possible Reasons for Event: poor po intake   Comments/MD notified:none    Stephen Paul

## 2020-04-22 NOTE — Progress Notes (Signed)
[facility]  - [place] at [facility]   PATIENT NAME: Stephen Paul    MR#:  [id number]  DATE OF BIRTH:  01-06-1932  SUBJECTIVE:  patient appears very frustrated this morning. He is upset about not able to go home. no respiratory distress noted however desats very easily Remains on Heated high flow nasal cannula and now added NRB back due to desaturations   REVIEW OF SYSTEMS:   Review of Systems  Constitutional: Negative for chills, fever and weight loss.  HENT: Negative for ear discharge, ear pain and nosebleeds.   Eyes: Negative for blurred vision, pain and discharge.  Respiratory: Positive for shortness of breath. Negative for sputum production, wheezing and stridor.   Cardiovascular: Negative for chest pain, palpitations, orthopnea and PND.  Gastrointestinal: Negative for abdominal pain, diarrhea, nausea and vomiting.  Genitourinary: Negative for frequency and urgency.  Musculoskeletal: Negative for back pain and joint pain.  Neurological: Negative for sensory change, speech change and focal weakness.  Psychiatric/Behavioral: Negative for depression and hallucinations. The patient is not nervous/anxious.    Tolerating Diet:yes Tolerating PT: not much due to significant hypoxia on min exertion  DRUG ALLERGIES:   Allergies  Allergen Reactions  . Mycophenolate Mofetil Rash    VITALS:  Blood pressure 132/72, pulse 97, temperature 97.8 F (36.6 C), temperature source Axillary, resp. rate 18, height 5\' 6"  (1.676 m), weight 70.4 kg, SpO2 92 %.  PHYSICAL EXAMINATION:   Physical Exam  GENERAL:  86 y.o.-year-old patient lying in the bed with mild acute distress.  Deconditioned, weak LUNGS: distant breath sounds bilaterally, no wheezing, rales, rhonchi. No use of accessory muscles of respiration.  CARDIOVASCULAR: S1, S2 normal. No murmurs, rubs, or gallops.  ABDOMEN: Soft, nontender, nondistended. Bowel sounds present. No organomegaly or mass.  EXTREMITIES: No  cyanosis, clubbing or edema b/l.    NEUROLOGIC: Cranial nerves II through XII are intact. No focal Motor or sensory deficits b/l.  weak PSYCHIATRIC:  patient is alert and oriented x 2 SKIN: No obvious rash, lesion, or ulcer.  Per RN  LABORATORY PANEL:  CBC Recent Labs  Lab 04/20/20 0438  WBC 16.8*  HGB 11.6*  HCT 33.3*  PLT 347    Chemistries  Recent Labs  Lab 04/20/20 0438  NA 139  K 4.5  CL 107  CO2 22  GLUCOSE 149*  BUN 25*  CREATININE 1.13  CALCIUM 8.3*  MG 1.8   Cardiac Enzymes No results for input(s): TROPONINI in the last 168 hours. RADIOLOGY:  No results found. ASSESSMENT AND PLAN:  Stephen Paul a 29 y.o.malewith medical history significant ofdiabetes and hypertension who apparently was told about a week ago that he was positive for COVID-19.  Came to ED with worsening shortness of breath.  Found to be hypoxic in 70s.  Chest x-ray consistent with COVID-19 pneumonia.  CTA negative. Patient is unvaccinated.  Acute hypoxic respiratory failure secondary to COVID-19 pneumonia sepsis secondary to COVID-19 infection. -Sepsis resolved Patient did meet sepsis criteria on presentation with being tachycardic, tachypneic, endorgan damage with lactic acidosis, AKI, elevated T bili and COVID-19 infection.   -Patient did not receive any fluid on admission.  No cultures obtained. - Procalcitonin elevated at 0.51. UA was positive for ketones but not very impressive for any infection.  Blood cultures negative.  -Completed the course of remdesivir, baricitinib was given initially and discontinued due to worsening leukocytosis.  --Completed the course of ceftriaxone and Zithromax. --Still requiring heated high flow, off non rebreather  --9/26  On Heated HFNC 40 l/min, 52% FIO2  -9/27-- HFNC/Heateed 100% and 50 L/min -Palliative care consulted to discuss the goals of care.--DNR --CRP trending up again, so solumedrol increased back up to 40 mg BID, now trending back  down to normal --continue oral decadron 6 mg daily, will need taper since pt has been on high-dose steroid for >10 days --Continue supplemental oxygen with heated HFNC and NRB  wean as able to keep sats >85%--weaning has been quiet challenging!! --Combivent q6h -lasix 40 mg x1 today -9/27--cxr for am  Non anion gap metabolic acidosis lactic acidosis, Resolved.  Lactic acid remained elevated.  CK within normal limit.  No history of liver disease, liver functions normal.    Anemia.   --Hgb stable between 10-11 Anemia workup showed iron def.  Folate and vit B12 wnl. --continue oral iron supplement  Type 2 diabetes mellitus.  Patient to have ketones in his urine most likely secondary to poor p.o. intake.  No anion gap.  A1c of 7.1.  Had 1 episode of hypoglycemia. --BG have been trending up, seemed to be from meals --continue linagliptin, Levemir 10u daily, Aspart  mealtime 15u TID -Continue moderate scale sliding scale.  Hypertension.   Blood pressure from 120's-150's.   -Started on amlodipine 10 mg daily (new)  AKI.   -resolved Cr peaked at 1.49.  Baseline around 1.   --encourage oral hydration  History of DVT on Eliquis Was on Eliquis at home due to history of DVT in February 2021.  CTA negative for acute PE. --continue home Eliquis  Leukocytosis Likely due to steroid.  Procal neg as of 9/16.  No other signs and symptoms of infection other than COVID.  No fever.  repeat procal again neg on 9/22 --Monitor for now and hold abx  Hyperkalemia, not POA --K 4.5 improved   DVT prophylaxis: [id number] Code Status: DNR Palliative consulted Family Communication: wife Status is: inpatient Dispo:   The patient is from: home Anticipated d/c is to: ??LTAC Anticipated d/c date is: unclear Patient currently is not medically stable to d/c due to: need to wean <65% before pt can be discharged to [facility].  Not making progress in weaning.  Still needing intermittent  nonrebreather on  top of heated hf TOC for d/c planning  TOTAL TIME TAKING CARE OF THIS PATIENT: *25* minutes.  >50% time spent on counselling and coordination of care  Note: This dictation was prepared with Dragon dictation along with smaller phrase technology. Any transcriptional errors that result from this process are unintentional.  Enedina Finner M.D    [facility]   CC: Primary care physician; Barbette Reichmann, MDPatient ID: Dian Situ, male   DOB: Apr 10, 1932, 86 y.o.   MRN: [id number]

## 2020-04-22 NOTE — Clinical Social Work Note (Signed)
CSW spoke with [facility] and they state pt has to be showing signs of improvement for them to take pt. Per chart review at this time pt is not reviewing.   [place], [place] [phone]

## 2020-04-23 ENCOUNTER — Inpatient Hospital Stay: Payer: Medicare Other

## 2020-04-23 LAB — GLUCOSE, CAPILLARY
Glucose-Capillary: 121 mg/dL — ABNORMAL HIGH (ref 70–99)
Glucose-Capillary: 136 mg/dL — ABNORMAL HIGH (ref 70–99)
Glucose-Capillary: 237 mg/dL — ABNORMAL HIGH (ref 70–99)
Glucose-Capillary: 288 mg/dL — ABNORMAL HIGH (ref 70–99)
Glucose-Capillary: 331 mg/dL — ABNORMAL HIGH (ref 70–99)

## 2020-04-23 MED ORDER — INSULIN ASPART 100 UNIT/ML ~~LOC~~ SOLN
5.0000 [IU] | Freq: Three times a day (TID) | SUBCUTANEOUS | Status: DC
  Administered 2020-04-23 – 2020-04-24 (×4): 5 [IU] via SUBCUTANEOUS
  Filled 2020-04-23 (×4): qty 1

## 2020-04-23 NOTE — Progress Notes (Signed)
Physical Therapy Treatment Patient Details Name: Stephen Paul MRN: [id number] DOB: Feb 27, 1932 Today's Date: 04/23/2020    History of Present Illness Stephen Paul is an 88yoM who comes to [facility] on 9/8 c SOB. Pt tested positive 1 week prior, but had no symptoms until recently. PMH: DM, HTN, (+) COVID test ~1 week prior to arrival. Pt moved to 55L heated highflow on 9/13.    PT Comments    Pt was long sitting in bed with HHFNC on 40 L and non rebreather around his neck but not actually wearing. BP 123/70, sao2 98%, between 18-35 throughout session. sao2 > 92% throughout. Pt becomes very tearful during session. Has baseline cognition deficits. Unwilling to get OOB but did agree to there ex. See exercises listed below. Pt will benefit from long term placement to continue skilled PT and progress towards PLOF and improve overall independence.      Follow Up Recommendations  LTACH     Equipment Recommendations  Other (comment) (defer to next level of care)    Recommendations for Other Services       Precautions / Restrictions Precautions Precautions: Fall Restrictions Weight Bearing Restrictions: No    Mobility  Bed Mobility      General bed mobility comments: pt performed supine exercises only.         Cognition Arousal/Alertness: Awake/alert Behavior During Therapy: WFL for tasks assessed/performed Overall Cognitive Status: No family/caregiver present to determine baseline cognitive functioning Area of Impairment: Orientation;Attention;Memory                 Orientation Level: Time;Situation             General Comments: Pt was very tearful throughout limited session. Was unwilling to trial sitting up EOB 2/2 to pain.       Exercises General Exercises - Lower Extremity Ankle Circles/Pumps: AROM;10 reps Quad Sets: AROM;Both;10 reps Gluteal Sets: AROM;Both;10 reps Heel Slides: AROM;10 reps Hip ABduction/ADduction: 10 reps;AROM Straight Leg Raises:  AROM;10 reps    General Comments        Pertinent Vitals/Pain Pain Assessment: No/denies pain           PT Goals (current goals can now be found in the care plan section) Acute Rehab PT Goals Patient Stated Goal: " none stated." Progress towards PT goals: Progressing toward goals    Frequency    Min 2X/week      PT Plan Current plan remains appropriate    Co-evaluation              AM-PAC PT "6 Clicks" Mobility   Outcome Measure  Help needed turning from your back to your side while in a flat bed without using bedrails?: A Little Help needed moving from lying on your back to sitting on the side of a flat bed without using bedrails?: A Little Help needed moving to and from a bed to a chair (including a wheelchair)?: A Little Help needed standing up from a chair using your arms (e.g., wheelchair or bedside chair)?: A Little Help needed to walk in hospital room?: A Lot Help needed climbing 3-5 steps with a railing? : A Lot 6 Click Score: 16    End of Session Equipment Utilized During Treatment: Oxygen (40 L HHF non rebreather around neck but not being used) Activity Tolerance: Patient limited by fatigue Patient left: in bed;with call bell/phone within reach;with bed alarm set Nurse Communication: Mobility status PT Visit Diagnosis: Other abnormalities of gait and mobility (R26.89)  Time: 7681-1572 PT Time Calculation (min) (ACUTE ONLY): 11 min  Charges:  $Therapeutic Exercise: 8-22 mins                     Jetta Lout PTA 04/23/20, 4:26 PM

## 2020-04-23 NOTE — Progress Notes (Signed)
[facility]  - [place] at [facility]   PATIENT NAME: Stephen Paul    MR#:  [id number]  DATE OF BIRTH:  December 27, 1931  SUBJECTIVE:  Remains on high flow nasal cannula and now added prn  NRB back due to desaturations Remains on 70% Fio2 Did not eat much BF REVIEW OF SYSTEMS:   Review of Systems  Constitutional: Negative for chills, fever and weight loss.  HENT: Negative for ear discharge, ear pain and nosebleeds.   Eyes: Negative for blurred vision, pain and discharge.  Respiratory: Positive for shortness of breath. Negative for sputum production, wheezing and stridor.   Cardiovascular: Negative for chest pain, palpitations, orthopnea and PND.  Gastrointestinal: Negative for abdominal pain, diarrhea, nausea and vomiting.  Genitourinary: Negative for frequency and urgency.  Musculoskeletal: Negative for back pain and joint pain.  Neurological: Negative for sensory change, speech change and focal weakness.  Psychiatric/Behavioral: Negative for depression and hallucinations. The patient is not nervous/anxious.    Tolerating Diet:yes Tolerating PT: not much due to significant hypoxia on min exertion  DRUG ALLERGIES:   Allergies  Allergen Reactions  . Mycophenolate Mofetil Rash    VITALS:  Blood pressure 123/76, pulse 89, temperature 97.9 F (36.6 C), resp. rate 18, height 5\' 6"  (1.676 m), weight 70.4 kg, SpO2 92 %.  PHYSICAL EXAMINATION:   Physical Exam  GENERAL:  86 y.o.-year-old patient lying in the bed with mild acute distress.  Deconditioned, weak LUNGS: distant breath sounds bilaterally, no wheezing, rales, rhonchi. No use of accessory muscles of respiration.  CARDIOVASCULAR: S1, S2 normal. No murmurs, rubs, or gallops.  ABDOMEN: Soft, nontender, nondistended. Bowel sounds present. No organomegaly or mass.  EXTREMITIES: No cyanosis, clubbing or edema b/l.    NEUROLOGIC: Cranial nerves II through XII are intact. No focal Motor or sensory deficits b/l.   weak PSYCHIATRIC:  patient is alert and oriented x 2 SKIN: No obvious rash, lesion, or ulcer.  Per RN  LABORATORY PANEL:  CBC Recent Labs  Lab 04/20/20 0438  WBC 16.8*  HGB 11.6*  HCT 33.3*  PLT 347    Chemistries  Recent Labs  Lab 04/20/20 0438  NA 139  K 4.5  CL 107  CO2 22  GLUCOSE 149*  BUN 25*  CREATININE 1.13  CALCIUM 8.3*  MG 1.8   Cardiac Enzymes No results for input(s): TROPONINI in the last 168 hours. RADIOLOGY:  DG Chest Port 1 View  Result Date: 04/23/2020 CLINICAL DATA:  Shortness of breath.  COVID-19 positive EXAM: PORTABLE CHEST 1 VIEW COMPARISON:  April 11, 2020. FINDINGS: There is airspace opacity in the mid and lower lung regions, more on the right than on the left. There is also diffuse interstitial thickening. There is slightly less opacity in the left upper lobe compared to most recent study. Heart size and pulmonary vascularity normal. No adenopathy. No bone lesions. IMPRESSION: Multifocal interstitial and alveolar opacity, more on the right than the left with slight clearing on the left and essentially no change on the right compared to prior study. Appearance consistent with multifocal pneumonia, likely of COVID-19 etiology given the history. Stable cardiac silhouette. No adenopathy appreciable. Electronically Signed   By: April 13, 2020 III M.D.   On: 04/23/2020 07:48   ASSESSMENT AND PLAN:  Stephen Paul a 37 y.o.malewith medical history significant ofdiabetes and hypertension who apparently was told about a week ago that he was positive for COVID-19.  Came to ED with worsening shortness of breath.  Found to be  hypoxic in 70s.  Chest x-ray consistent with COVID-19 pneumonia.  CTA negative. Patient is unvaccinated.  Acute hypoxic respiratory failure secondary to COVID-19 pneumonia sepsis secondary to COVID-19 infection. -Sepsis resolved Patient did meet sepsis criteria on presentation with being tachycardic, tachypneic, endorgan  damage with lactic acidosis, AKI, elevated T bili and COVID-19 infection.   -Patient did not receive any fluid on admission.  No cultures obtained. - Procalcitonin elevated at 0.51. UA was positive for ketones but not very impressive for any infection.  Blood cultures negative.  -Completed the course of remdesivir, baricitinib was given initially and discontinued due to worsening leukocytosis.  --Completed the course of ceftriaxone and Zithromax. --Still requiring heated high flow, off non rebreather  --9/26 On Heated HFNC 40 l/min, 52% FIO2  -9/27-- HFNC/Heateed 100% and 50 L/min -Palliative care consulted to discuss the goals of care.--DNR --CRP trending up again, so solumedrol increased back up to 40 mg BID, now trending back down to normal --continue oral decadron 6 mg daily, will need taper since pt has been on high-dose steroid for >10 days--down to 2 mg po daily--wean off in 2-3 days --Continue supplemental oxygen with  HFNC and NRB  wean as able to keep sats >85%--weaning has been quiet challenging!! --Combivent q6h -9/27--lasix 40 mg x1 today -9/28-- CXR shows Multifocal interstitial and alveolar opacity, more on the right than the left with slight clearing on the left and essentially no change on the right compared to prior study. Appearance consistent with multifocal pneumonia, likely of COVID-19   Non anion gap metabolic acidosis lactic acidosis, Resolved.  Lactic acid remained elevated.  CK within normal limit.  No history of liver disease, liver functions normal.    Anemia.   --Hgb stable between 10-11 Anemia workup showed iron def.  Folate and vit B12 wnl. --continue oral iron supplement  Type 2 diabetes mellitus.  Patient to have ketones in his urine most likely secondary to poor p.o. intake.  No anion gap.  A1c of 7.1.  Had 1 episode of hypoglycemia. --BG have been trending up, seemed to be from meals --continue linagliptin, Levemir 10u daily, Aspart  mealtime 15u  TID -Continue moderate scale sliding scale.  Hypertension.   Blood pressure from 120's-150's.   -Started on amlodipine 10 mg daily (new)  AKI.   -resolved Cr peaked at 1.49.  Baseline around 1.   --encourage oral hydration  History of DVT on Eliquis Was on Eliquis at home due to history of DVT in February 2021.  CTA negative for acute PE. --continue home Eliquis  Leukocytosis Likely due to steroid.  Procal neg as of 9/16.  No other signs and symptoms of infection other than COVID.  No fever.  repeat procal again neg on 9/22 --Monitor for now and hold abx  Hyperkalemia, not POA --K 4.5 improved   DVT prophylaxis: [id number] Code Status: DNR Palliative consulted Family Communication: wife on the phone today. Mentioned about LTAC discharge. she will d/w husband  Status is: inpatient Dispo:   The patient is from: home Anticipated d/c is to: ?LTAC Anticipated d/c date is: unclear Patient currently is not medically stable to d/c due to: need to wean <65% before pt can be discharged to [facility].  Not making progress in weaning.  Still needing intermittent  nonrebreather on top of hfnc TOC for d/c planning--to [facility] with [facility]  04/23/20-- I spoke with patient at length regarding his discharge planning and option for LTAC was presented. Patient kept repeating he  wants to get better and be discharged from the hospital. I explained to him he is requiring high amount of oxygen given the lung damage from COVID pneumonia and is best option once FIU to less than 65% is LTAC. He did say yes to me for pursuing this option. I did leave a message for Isrrael Fluckiger regarding above discussion.  TOTAL TIME TAKING CARE OF THIS PATIENT: *25* minutes.  >50% time spent on counselling and coordination of care  Note: This dictation was prepared with Dragon dictation along with smaller phrase technology. Any transcriptional errors that result from this process are unintentional.  Enedina Finner M.D     [facility]   CC: Primary care physician; Barbette Reichmann, MDPatient ID: Stephen Paul, male   DOB: [birth year], 86 y.o.   MRN: [id number]

## 2020-04-23 NOTE — TOC Progression Note (Signed)
Transition of Care [facility]) - Progression Note    Patient Details  Name: Stephen Paul MRN: [id number] Date of Birth: 02/07/1932  [facility] CM/SW Contact  Maree Krabbe, LCSW Phone Number: 04/23/2020, 11:49 AM  Clinical Narrative:    Per MD pt is agreeable to [facility], however his Fi02 is currently at 70%, must be at 65% or lower for [facility] admission. MD aware. Referral has been made to [facility].        Expected Discharge Plan and Services                                                 Social Determinants of Health (SDOH) Interventions    Readmission Risk Interventions No flowsheet data found.

## 2020-04-24 ENCOUNTER — Ambulatory Visit (HOSPITAL_COMMUNITY)
Admission: AD | Admit: 2020-04-24 | Discharge: 2020-04-24 | Disposition: A | Discharge status: Home / Self Care | Payer: Medicare Other | Source: Other Acute Inpatient Hospital | Attending: Internal Medicine | Admitting: Internal Medicine

## 2020-04-24 ENCOUNTER — Inpatient Hospital Stay
Admission: RE | Admit: 2020-04-24 | Discharge: 2020-05-27 | Disposition: E | Payer: Medicare Other | Source: Other Acute Inpatient Hospital | Attending: Internal Medicine | Admitting: Internal Medicine

## 2020-04-24 DIAGNOSIS — U071 COVID-19: Secondary | ICD-10-CM | POA: Diagnosis present

## 2020-04-24 DIAGNOSIS — J4489 Other specified chronic obstructive pulmonary disease: Secondary | ICD-10-CM | POA: Diagnosis present

## 2020-04-24 DIAGNOSIS — J449 Chronic obstructive pulmonary disease, unspecified: Secondary | ICD-10-CM | POA: Diagnosis present

## 2020-04-24 DIAGNOSIS — J989 Respiratory disorder, unspecified: Secondary | ICD-10-CM | POA: Insufficient documentation

## 2020-04-24 DIAGNOSIS — J9690 Respiratory failure, unspecified, unspecified whether with hypoxia or hypercapnia: Secondary | ICD-10-CM

## 2020-04-24 DIAGNOSIS — R6520 Severe sepsis without septic shock: Secondary | ICD-10-CM | POA: Diagnosis present

## 2020-04-24 DIAGNOSIS — J9621 Acute and chronic respiratory failure with hypoxia: Secondary | ICD-10-CM | POA: Diagnosis present

## 2020-04-24 DIAGNOSIS — R6889 Other general symptoms and signs: Secondary | ICD-10-CM

## 2020-04-24 DIAGNOSIS — A419 Sepsis, unspecified organism: Secondary | ICD-10-CM | POA: Diagnosis present

## 2020-04-24 DIAGNOSIS — J189 Pneumonia, unspecified organism: Secondary | ICD-10-CM

## 2020-04-24 HISTORY — DX: Sepsis, unspecified organism: A41.9

## 2020-04-24 HISTORY — DX: Chronic obstructive pulmonary disease, unspecified: J44.9

## 2020-04-24 HISTORY — DX: Acute and chronic respiratory failure with hypoxia: J96.21

## 2020-04-24 HISTORY — DX: COVID-19: U07.1

## 2020-04-24 LAB — CBC
HCT: 36.9 % — ABNORMAL LOW (ref 39.0–52.0)
Hemoglobin: 12.3 g/dL — ABNORMAL LOW (ref 13.0–17.0)
MCH: 22.3 pg — ABNORMAL LOW (ref 26.0–34.0)
MCHC: 33.3 g/dL (ref 30.0–36.0)
MCV: 67 fL — ABNORMAL LOW (ref 80.0–100.0)
Platelets: 229 10*3/uL (ref 150–400)
RBC: 5.51 MIL/uL (ref 4.22–5.81)
RDW: 22.9 % — ABNORMAL HIGH (ref 11.5–15.5)
WBC: 16.6 10*3/uL — ABNORMAL HIGH (ref 4.0–10.5)
nRBC: 0 % (ref 0.0–0.2)

## 2020-04-24 LAB — CREATININE, SERUM
Creatinine, Ser: 1.25 mg/dL — ABNORMAL HIGH (ref 0.61–1.24)
GFR calc Af Amer: >60 mL/min (ref >60)
GFR calc non Af Amer: 52 mL/min — ABNORMAL LOW (ref >60)

## 2020-04-24 LAB — GLUCOSE, CAPILLARY
Glucose-Capillary: 139 mg/dL — ABNORMAL HIGH (ref 70–99)
Glucose-Capillary: 173 mg/dL — ABNORMAL HIGH (ref 70–99)
Glucose-Capillary: 174 mg/dL — ABNORMAL HIGH (ref 70–99)

## 2020-04-24 MED ORDER — FE FUMARATE-B12-VIT C-FA-IFC PO CAPS: 1.0000 | ORAL_CAPSULE | Freq: Two times a day (BID) | ORAL | Status: AC

## 2020-04-24 MED ORDER — AMLODIPINE BESYLATE 10 MG PO TABS: 10.0000 mg | ORAL_TABLET | Freq: Every day | ORAL | Status: AC

## 2020-04-24 MED ORDER — LINAGLIPTIN 5 MG PO TABS: 5.0000 mg | ORAL_TABLET | Freq: Every day | ORAL | Status: AC

## 2020-04-24 MED ORDER — THIAMINE HCL 100 MG PO TABS: 100.0000 mg | ORAL_TABLET | Freq: Every day | ORAL | Status: AC

## 2020-04-24 NOTE — Discharge Summary (Signed)
Physician Discharge Summary  Stephen Paul ZOX:[id number] DOB: [birth year] DOA: 04/03/2020  PCP: Stephen Reichmann, MD  Admit date: 04/03/2020 Discharge date: 04/26/20  Admitted From: Home Disposition: LTACH  Recommendations for Outpatient Follow-up:  1. Wean oxygen down as able 2. Continue PT 3. Continue palliative care 4. Please follow up on the following pending results: None   Discharge Condition: Guarded CODE STATUS: DNR Diet recommendation: Dysphagia 2 diet   Brief/Interim Summary:  Admission HPI written by Rometta Emery, MD   Chief Complaint: Shortness of breath  HPI: Stephen Paul is a 86 y.o. male with medical history significant of diabetes and hypertension who apparently was told about a week ago that he was positive for COVID-19.  Never had any symptoms did not feel any worse until now.  His shortness of breath was progressive and got worse.  On arrival in the ER his oxygen sat was in the 70s.  He is having some cough.  He also reported subjective fever at home.  He is having abdominal pain as well.  Patient was evaluated again and found to be COVID-19 positive in the ER.  Patient also has had some loss of appetite.  Currently on BiPAP in the ER with oxygen sat at 100%.  He is currently being admitted to the hospital with acute respiratory failure secondary to COVID-19 infection.  Appears to have COPD on CT.   Hospital course:  Acute respiratory failure with hypoxia Secondary to COVID-19 pneumonia.  Patient has been managed on heated high flow nasal cannula.  Patient has been weaned down to an FiO2 of 40% currently on 40 L/min.  Anticipate prolonged wean secondary.  COVID-19 Pneumonia Bilateral lung infiltrates and hypoxia.  Patient was treated with oxygen supplementation, remdesivir IV, baricitinib.  Patient was initially treated with ceftriaxone and azithromycin for concern of bacterial complaint.  Baricitinib was discontinued after 3 doses secondary to  worsening leukocytosis, but patient completed his course of remdesivir.  Patient was treated on IV Solu-Medrol which was then transitioned to Decadron.  Severe sepsis Present on admission. Associated lactic acidosis and AKI. Procalcitonin was mildly elevated at 0.51 and quickly resolved. Secondary to above. Blood cultures with no growth to date.  Date of positive Covid-19 test was 04/03/2020.  Non-anion gap metabolic acidosis Resolved.  Lactic acidosis Stable with no evidence of worsening sepsis concern. Trend discontinued early in admission.  Anemia Unknown if chronic or acute. Has been stable while inpatient. Microcytic with no episode of bleeding noted. Patient may need GI workup once stable for outpatient management unless anemia worsens or has frank GI bleed  Diabetes mellitus, type 2 Patient is on metformin 1000 mg twice daily and glipizide 10 mg daily.  Hemoglobin A1c of 7.1%.  While inpatient, patient has been managed on Levemir 10 units daily in addition to sliding scale insulin and linagliptin.  Blood sugars been complicated by dexamethasone use.  Blood sugars moderately controlled on this regimen. Since discontinuing steroids, will recommend holding home regimen and continuing with linagliptin. Can resume Levemir if blood sugar is uncontrolled at [facility]. Recommend continuing to check blood sugar before breakfast, lunch and dinner while at [facility] and reinitiate insulin as needed.  Essential hypertension Well-controlled on amlodipine while inpatient.  AKI Likely secondary to dehydration.  Patient improved with IV fluids.  Creatinine trended up slightly prior to discharge.  Avoid nephrotoxic agents.  History of DVT Continue Eliquis  Leukocytosis Likely demargination from steroid use. Afebrile.  Hyperkalemia Resolved.  Discharge Diagnoses:  Principal Problem:   Acute respiratory failure due to COVID-19 [facility]) Active Problems:   Essential hypertension   Diabetes (HCC)   Goals  of care, counseling/discussion   Palliative care by specialist   DNR (do not resuscitate)   Lactic acidosis   Anemia, iron deficiency   AKI (acute kidney injury) (HCC)   Chronic anticoagulation    Discharge Instructions   Allergies as of 03/30/2020      Reactions   Mycophenolate Mofetil Rash      Medication List    STOP taking these medications   cyanocobalamin 100 MCG tablet   ferrous sulfate 325 (65 FE) MG EC tablet   glipiZIDE 10 MG tablet Commonly known as: GLUCOTROL   metFORMIN 1000 MG tablet Commonly known as: GLUCOPHAGE     TAKE these medications   amLODipine 10 MG tablet Commonly known as: NORVASC Take 1 tablet (10 mg total) by mouth daily. Start taking on: April 25, 2020   apixaban 5 MG Tabs tablet Commonly known as: ELIQUIS Take 5 mg by mouth every 12 (twelve) hours.   aspirin 81 MG EC tablet Take 81 mg by mouth daily.   ferrous fumarate-b12-vitamic C-folic acid capsule Commonly known as: TRINSICON / FOLTRIN Take 1 capsule by mouth 2 (two) times daily after a meal.   gabapentin 100 MG capsule Commonly known as: NEURONTIN Take 200 mg by mouth at bedtime.   linagliptin 5 MG Tabs tablet Commonly known as: TRADJENTA Take 1 tablet (5 mg total) by mouth daily. Start taking on: April 25, 2020   omeprazole 20 MG capsule Commonly known as: PRILOSEC Take 20 mg by mouth daily.   polyvinyl alcohol 1.4 % ophthalmic solution Commonly known as: LIQUIFILM TEARS Place 1 drop into both eyes as needed.   thiamine 100 MG tablet Take 1 tablet (100 mg total) by mouth daily. Start taking on: April 25, 2020   traMADol 50 MG tablet Commonly known as: ULTRAM Take 50 mg by mouth in the morning and at bedtime.       Allergies  Allergen Reactions  . Mycophenolate Mofetil Rash    Consultations:  Palliative care medicine   Procedures/Studies: CT Angio Chest PE W and/or Wo Contrast  Result Date: 04/03/2020 CLINICAL DATA:  COVID-19  positive, shortness of breath, weakness, hypoxia, abdominal pain EXAM: CT ANGIOGRAPHY CHEST CT ABDOMEN AND PELVIS WITH CONTRAST TECHNIQUE: Multidetector CT imaging of the chest was performed using the standard protocol during bolus administration of intravenous contrast. Multiplanar CT image reconstructions and MIPs were obtained to evaluate the vascular anatomy. Multidetector CT imaging of the abdomen and pelvis was performed using the standard protocol during bolus administration of intravenous contrast. CONTRAST:  34mL OMNIPAQUE IOHEXOL 350 MG/ML SOLN COMPARISON:  04/03/2020 FINDINGS: CTA CHEST FINDINGS Cardiovascular: This is a technically adequate evaluation of the pulmonary vasculature. No filling defects or pulmonary emboli. The heart is unremarkable without pericardial effusion. Normal caliber of the thoracic aorta. Mediastinum/Nodes: Mildly enlarged subcarinal lymph node measures 13 mm in short axis. Trachea, esophagus, and thyroid are unremarkable. Lungs/Pleura: There is severe upper lobe predominant emphysema. No acute airspace disease, effusion, or pneumothorax. Bibasilar areas of scarring are noted, right greater than left. The central airways are patent. Musculoskeletal: No acute or destructive bony lesions. Reconstructed images demonstrate no additional findings. Review of the MIP images confirms the above findings. CT ABDOMEN and PELVIS FINDINGS Hepatobiliary: 4.2 cm cyst within the caudate lobe of the liver. Otherwise liver is unremarkable. No biliary dilation. The gallbladder is normal. Pancreas:  Unremarkable. No pancreatic ductal dilatation or surrounding inflammatory changes. Spleen: Normal in size without focal abnormality. Adrenals/Urinary Tract: Adrenal glands are unremarkable. Kidneys are normal, without renal calculi, focal lesion, or hydronephrosis. Bladder is unremarkable. Stomach/Bowel: No bowel obstruction or ileus. Normal appendix right lower quadrant. There is diffuse diverticulosis of  the descending and sigmoid colon without diverticulitis. Vascular/Lymphatic: Aortic atherosclerosis. No enlarged abdominal or pelvic lymph nodes. Reproductive: Prostate is unremarkable. Other: No free fluid or free gas.  No abdominal wall hernia. Musculoskeletal: No acute or destructive bony lesions. Reconstructed images demonstrate no additional findings. Review of the MIP images confirms the above findings. IMPRESSION: 1. No evidence of pulmonary embolus. 2. Borderline enlarged subcarinal lymph node, likely reactive. 3. Diverticulosis without diverticulitis. 4. Aortic Atherosclerosis (ICD10-I70.0) and Emphysema (ICD10-J43.9). Electronically Signed   By: Sharlet Salina M.D.   On: 04/03/2020 19:14   CT ABDOMEN PELVIS W CONTRAST  Result Date: 04/03/2020 CLINICAL DATA:  COVID-19 positive, shortness of breath, weakness, hypoxia, abdominal pain EXAM: CT ANGIOGRAPHY CHEST CT ABDOMEN AND PELVIS WITH CONTRAST TECHNIQUE: Multidetector CT imaging of the chest was performed using the standard protocol during bolus administration of intravenous contrast. Multiplanar CT image reconstructions and MIPs were obtained to evaluate the vascular anatomy. Multidetector CT imaging of the abdomen and pelvis was performed using the standard protocol during bolus administration of intravenous contrast. CONTRAST:  29mL OMNIPAQUE IOHEXOL 350 MG/ML SOLN COMPARISON:  04/03/2020 FINDINGS: CTA CHEST FINDINGS Cardiovascular: This is a technically adequate evaluation of the pulmonary vasculature. No filling defects or pulmonary emboli. The heart is unremarkable without pericardial effusion. Normal caliber of the thoracic aorta. Mediastinum/Nodes: Mildly enlarged subcarinal lymph node measures 13 mm in short axis. Trachea, esophagus, and thyroid are unremarkable. Lungs/Pleura: There is severe upper lobe predominant emphysema. No acute airspace disease, effusion, or pneumothorax. Bibasilar areas of scarring are noted, right greater than left. The  central airways are patent. Musculoskeletal: No acute or destructive bony lesions. Reconstructed images demonstrate no additional findings. Review of the MIP images confirms the above findings. CT ABDOMEN and PELVIS FINDINGS Hepatobiliary: 4.2 cm cyst within the caudate lobe of the liver. Otherwise liver is unremarkable. No biliary dilation. The gallbladder is normal. Pancreas: Unremarkable. No pancreatic ductal dilatation or surrounding inflammatory changes. Spleen: Normal in size without focal abnormality. Adrenals/Urinary Tract: Adrenal glands are unremarkable. Kidneys are normal, without renal calculi, focal lesion, or hydronephrosis. Bladder is unremarkable. Stomach/Bowel: No bowel obstruction or ileus. Normal appendix right lower quadrant. There is diffuse diverticulosis of the descending and sigmoid colon without diverticulitis. Vascular/Lymphatic: Aortic atherosclerosis. No enlarged abdominal or pelvic lymph nodes. Reproductive: Prostate is unremarkable. Other: No free fluid or free gas.  No abdominal wall hernia. Musculoskeletal: No acute or destructive bony lesions. Reconstructed images demonstrate no additional findings. Review of the MIP images confirms the above findings. IMPRESSION: 1. No evidence of pulmonary embolus. 2. Borderline enlarged subcarinal lymph node, likely reactive. 3. Diverticulosis without diverticulitis. 4. Aortic Atherosclerosis (ICD10-I70.0) and Emphysema (ICD10-J43.9). Electronically Signed   By: Sharlet Salina M.D.   On: 04/03/2020 19:14   DG Chest Port 1 View  Result Date: 04/23/2020 CLINICAL DATA:  Shortness of breath.  COVID-19 positive EXAM: PORTABLE CHEST 1 VIEW COMPARISON:  April 11, 2020. FINDINGS: There is airspace opacity in the mid and lower lung regions, more on the right than on the left. There is also diffuse interstitial thickening. There is slightly less opacity in the left upper lobe compared to most recent study. Heart size and pulmonary vascularity  normal.  No adenopathy. No bone lesions. IMPRESSION: Multifocal interstitial and alveolar opacity, more on the right than the left with slight clearing on the left and essentially no change on the right compared to prior study. Appearance consistent with multifocal pneumonia, likely of COVID-19 etiology given the history. Stable cardiac silhouette. No adenopathy appreciable. Electronically Signed   By: Bretta Bang III M.D.   On: 04/23/2020 07:48   DG Chest Port 1 View  Result Date: 04/11/2020 CLINICAL DATA:  Hypoxia.  COVID positive EXAM: PORTABLE CHEST 1 VIEW COMPARISON:  04/08/2020 FINDINGS: Stable airspace and interstitial opacity. Normal heart size. No visible effusion or pneumothorax. Artifact from EKG leads IMPRESSION: Stable infiltrates. Electronically Signed   By: Marnee Spring M.D.   On: 04/11/2020 09:13   DG Chest Port 1 View  Result Date: 04/08/2020 CLINICAL DATA:  COVID-19 positive.  Shortness of breath. EXAM: PORTABLE CHEST 1 VIEW COMPARISON:  April 03, 2020. FINDINGS: The heart size and mediastinal contours are within normal limits. No pneumothorax or pleural effusion is noted. Increased patchy airspace opacities are noted throughout both lungs concerning for multifocal pneumonia. The visualized skeletal structures are unremarkable. IMPRESSION: Increased patchy airspace opacities are noted throughout both lungs concerning for multifocal pneumonia. Electronically Signed   By: Lupita Raider M.D.   On: 04/08/2020 14:26   DG Chest Portable 1 View  Result Date: 04/03/2020 CLINICAL DATA:  Respiratory distress, weakness, short of breath EXAM: PORTABLE CHEST 1 VIEW COMPARISON:  None. FINDINGS: Single frontal view of the chest demonstrates an unremarkable cardiac silhouette. There is diffuse increased interstitial prominence throughout the lungs, without focal consolidation, effusion, or pneumothorax. There are no acute bony abnormalities. IMPRESSION: 1. Diffuse increased interstitial  prominence, without focal lung consolidation. Findings are of uncertain acuity, but could reflect interstitial edema, atypical infection, or scarring. Electronically Signed   By: Sharlet Salina M.D.   On: 04/03/2020 15:51      Subjective: Some dyspnea. Thirsty. No other concerns.  Discharge Exam: Vitals:   05-18-2020 1100 05/18/20 1237  BP: 130/78   Pulse: (!) 29   Resp: (!) 25   Temp: 98.2 F (36.8 C)   SpO2: 99% 94%   Vitals:   2020-05-18 0848 2020-05-18 1000 May 18, 2020 1100 05-18-2020 1237  BP: (!) 141/83 130/80 130/78   Pulse: 93  (!) 29   Resp: (!) 22 19 (!) 25   Temp: 97.8 F (36.6 C)  98.2 F (36.8 C)   TempSrc:      SpO2: 94%  99% 94%  Weight:      Height:        General: Pt is alert, awake, not in acute distress Cardiovascular: RRR, S1/S2 +, no rubs, no gallops Respiratory: Diminished breath sounds with disposable stethoscope; no significant rales heard. No wheezing heard. No increased work of breathing. Abdominal: Soft, NT, ND, bowel sounds + Extremities: no edema, no cyanosis    The results of significant diagnostics from this hospitalization (including imaging, microbiology, ancillary and laboratory) are listed below for reference.     Microbiology: No results found for this or any previous visit (from the past 240 hour(s)).   Labs: BNP (last 3 results) Recent Labs    04/03/20 1530  BNP 193.5*   Basic Metabolic Panel: Recent Labs  Lab 04/18/20 0412 04/19/20 0625 04/20/20 0438 05/18/20 0419  NA 136 137 139  --   K 5.6* 4.3 4.5  --   CL 104 105 107  --   CO2 25 23 22   --  GLUCOSE 210* 263* 149*  --   BUN 28* 25* 25*  --   CREATININE 1.21 1.01 1.13 1.25*  CALCIUM 8.5* 8.1* 8.3*  --   MG 2.0 1.8 1.8  --    Liver Function Tests: No results for input(s): AST, ALT, ALKPHOS, BILITOT, PROT, ALBUMIN in the last 168 hours. No results for input(s): LIPASE, AMYLASE in the last 168 hours. No results for input(s): AMMONIA in the last 168  hours. CBC: Recent Labs  Lab 04/18/20 0412 04/19/20 0625 04/20/20 0438 2020-06-02 0419  WBC 16.5* 15.8* 16.8* 16.6*  HGB 10.1* 11.3* 11.6* 12.3*  HCT 31.3* 33.2* 33.3* 36.9*  MCV 66.6* 64.0* 63.7* 67.0*  PLT 383 371 347 229   Cardiac Enzymes: No results for input(s): CKTOTAL, CKMB, CKMBINDEX, TROPONINI in the last 168 hours. BNP: Invalid input(s): POCBNP CBG: Recent Labs  Lab 04/23/20 1751 04/23/20 1955 04/23/20 2350 2020-06-02 0851 2020-06-02 1142  GLUCAP 331* 288* 136* 139* 173*   D-Dimer No results for input(s): DDIMER in the last 72 hours. Hgb A1c No results for input(s): HGBA1C in the last 72 hours. Lipid Profile No results for input(s): CHOL, HDL, LDLCALC, TRIG, CHOLHDL, LDLDIRECT in the last 72 hours. Thyroid function studies No results for input(s): TSH, T4TOTAL, T3FREE, THYROIDAB in the last 72 hours.  Invalid input(s): FREET3 Anemia work up No results for input(s): VITAMINB12, FOLATE, FERRITIN, TIBC, IRON, RETICCTPCT in the last 72 hours. Urinalysis    Component Value Date/Time   COLORURINE YELLOW (A) 04/03/2020 0828   APPEARANCEUR HAZY (A) 04/03/2020 0828   LABSPEC 1.027 04/03/2020 0828   PHURINE 5.0 04/03/2020 0828   GLUCOSEU >=500 (A) 04/03/2020 0828   HGBUR MODERATE (A) 04/03/2020 0828   BILIRUBINUR NEGATIVE 04/03/2020 0828   KETONESUR 20 (A) 04/03/2020 0828   PROTEINUR >=300 (A) 04/03/2020 0828   NITRITE NEGATIVE 04/03/2020 0828   LEUKOCYTESUR NEGATIVE 04/03/2020 0828    Time coordinating discharge: 35 minutes  SIGNED:   Jacquelin Hawkingalph Brookelyn Gaynor, MD [facility] Jul 04, 2020, 1:12 PM

## 2020-04-24 NOTE — TOC Transition Note (Signed)
Transition of Care [facility]) - CM/SW Discharge Note   Patient Details  Name: Stephen Paul MRN: [id number] Date of Birth: May 18, 1932  [facility]) CM/SW Contact:  Maree Krabbe, LCSW Phone Number: May 14, 2020, 1:16 PM   Clinical Narrative:   Patient will d/c to [facility] today. MD agreeable. Pt will go to room 5E01. Admitting provider is Luna Kitchens. Report # is [phone]. Pt will go via [facility]. Charge RN to arrange. Pt will need pickup no earlier than 6:00PM as pt's room will not be available until 6.    Final next level of care: Long Term Acute Care (LTAC) Barriers to Discharge: No Barriers Identified   Patient Goals and CMS Choice        Discharge Placement              Patient chooses bed at:  ([facility]) Patient to be transferred to facility by: [facility]   Patient and family notified of of transfer: 05/05/2020  Discharge Plan and Services                                     Social Determinants of Health (SDOH) Interventions     Readmission Risk Interventions No flowsheet data found.

## 2020-04-24 NOTE — Progress Notes (Signed)
PT Cancellation Note  Patient Details Name: Stephen Paul MRN: [id number] DOB: 1931/08/04   Cancelled Treatment:    Reason Eval/Treat Not Completed: Other (comment). Per RN, patient getting ready to go to [facility] today. Will continue to follow and see tomorrow if transfer does not occur.     Wadsworth Skolnick Apr 28, 2020, 1:01 PM

## 2020-04-24 NOTE — Progress Notes (Signed)
Patient continues on heated high flow nasal cannula at 40L 50% fio2 with non-rebreather mask at 15L, spo2 94%. Will continue to monitor.

## 2020-04-24 NOTE — Progress Notes (Signed)
T/C to [phone]). Report given to [employer]. Samone requested to keep the pts IV access in. Informed Samone the patient will be picked up from [facility] at 1800.Patient will be going to 5E01 at [facility].

## 2020-04-25 LAB — BASIC METABOLIC PANEL
Anion gap: 13 (ref 5–15)
BUN: 23 mg/dL (ref 8–23)
CO2: 16 mmol/L — ABNORMAL LOW (ref 22–32)
Calcium: 8.2 mg/dL — ABNORMAL LOW (ref 8.9–10.3)
Chloride: 107 mmol/L (ref 98–111)
Creatinine, Ser: 1.24 mg/dL (ref 0.61–1.24)
GFR calc Af Amer: >60 mL/min (ref >60)
GFR calc non Af Amer: 52 mL/min — ABNORMAL LOW (ref >60)
Glucose, Bld: 107 mg/dL — ABNORMAL HIGH (ref 70–99)
Potassium: 5 mmol/L (ref 3.5–5.1)
Sodium: 136 mmol/L (ref 135–145)

## 2020-04-25 LAB — CBC
HCT: 40.3 % (ref 39.0–52.0)
Hemoglobin: 13 g/dL (ref 13.0–17.0)
MCH: 22.3 pg — ABNORMAL LOW (ref 26.0–34.0)
MCHC: 32.3 g/dL (ref 30.0–36.0)
MCV: 69 fL — ABNORMAL LOW (ref 80.0–100.0)
Platelets: 188 10*3/uL (ref 150–400)
RBC: 5.84 MIL/uL — ABNORMAL HIGH (ref 4.22–5.81)
RDW: 23.8 % — ABNORMAL HIGH (ref 11.5–15.5)
WBC: 13.6 10*3/uL — ABNORMAL HIGH (ref 4.0–10.5)
nRBC: 0 % (ref 0.0–0.2)

## 2020-04-26 DEATH — deceased

## 2020-04-27 ENCOUNTER — Other Ambulatory Visit (HOSPITAL_COMMUNITY): Payer: Medicare Other

## 2020-04-27 LAB — BASIC METABOLIC PANEL
Anion gap: 12 (ref 5–15)
BUN: 22 mg/dL (ref 8–23)
CO2: 17 mmol/L — ABNORMAL LOW (ref 22–32)
Calcium: 8.1 mg/dL — ABNORMAL LOW (ref 8.9–10.3)
Chloride: 106 mmol/L (ref 98–111)
Creatinine, Ser: 1.24 mg/dL (ref 0.61–1.24)
GFR calc Af Amer: >60 mL/min (ref >60)
GFR calc non Af Amer: 52 mL/min — ABNORMAL LOW (ref >60)
Glucose, Bld: 417 mg/dL — ABNORMAL HIGH (ref 70–99)
Potassium: 4.5 mmol/L (ref 3.5–5.1)
Sodium: 135 mmol/L (ref 135–145)

## 2020-04-27 LAB — URINALYSIS, ROUTINE W REFLEX MICROSCOPIC
Bilirubin Urine: NEGATIVE
Glucose, UA: >=500 mg/dL — AB
Hgb urine dipstick: NEGATIVE
Ketones, ur: NEGATIVE mg/dL
Leukocytes,Ua: NEGATIVE
Nitrite: NEGATIVE
Protein, ur: NEGATIVE mg/dL
Specific Gravity, Urine: 1.013 (ref 1.005–1.030)
pH: 5 (ref 5.0–8.0)

## 2020-04-27 LAB — CBC
HCT: 37.3 % — ABNORMAL LOW (ref 39.0–52.0)
Hemoglobin: 12.3 g/dL — ABNORMAL LOW (ref 13.0–17.0)
MCH: 22.6 pg — ABNORMAL LOW (ref 26.0–34.0)
MCHC: 33 g/dL (ref 30.0–36.0)
MCV: 68.6 fL — ABNORMAL LOW (ref 80.0–100.0)
Platelets: 178 10*3/uL (ref 150–400)
RBC: 5.44 MIL/uL (ref 4.22–5.81)
RDW: 24 % — ABNORMAL HIGH (ref 11.5–15.5)
WBC: 13.9 10*3/uL — ABNORMAL HIGH (ref 4.0–10.5)
nRBC: 0 % (ref 0.0–0.2)

## 2020-04-29 ENCOUNTER — Other Ambulatory Visit (HOSPITAL_COMMUNITY): Payer: Medicare Other

## 2020-04-29 ENCOUNTER — Encounter: Payer: Self-pay | Admitting: Internal Medicine

## 2020-04-29 DIAGNOSIS — U071 COVID-19: Secondary | ICD-10-CM | POA: Diagnosis present

## 2020-04-29 DIAGNOSIS — J449 Chronic obstructive pulmonary disease, unspecified: Secondary | ICD-10-CM

## 2020-04-29 DIAGNOSIS — A419 Sepsis, unspecified organism: Secondary | ICD-10-CM

## 2020-04-29 DIAGNOSIS — R6520 Severe sepsis without septic shock: Secondary | ICD-10-CM | POA: Diagnosis present

## 2020-04-29 DIAGNOSIS — J9621 Acute and chronic respiratory failure with hypoxia: Secondary | ICD-10-CM | POA: Diagnosis present

## 2020-04-29 LAB — URINE CULTURE: Culture: 70000 — AB

## 2020-04-29 LAB — ECHOCARDIOGRAM LIMITED
S' Lateral: 2 cm
Weight: 2523.2 oz

## 2020-04-29 NOTE — Progress Notes (Signed)
  Echocardiogram 2D Echocardiogram has been performed.  Pieter Partridge 04/29/2020, 1:52 PM

## 2020-04-29 NOTE — Consult Note (Signed)
Pulmonary Critical Care Medicine [facility] [place]  PULMONARY SERVICE  Date of Service: 04/29/2020  PULMONARY CRITICAL CARE CONSULT   Stephen Paul  GYI:[id number]  DOB: 10-08-1931   DOA: 04/23/2020  Referring Physician: Carron Curie, MD  HPI: Stephen Paul is a 86 y.o. male seen for follow up of Acute on Chronic Respiratory Failure.  Patient has history of diabetes as well as hypertension presented to the hospital because of increasing shortness of breath apparently was tested a week prior to admission and positive for COVID-19.  Breathing got worse and so came into the emergency room.  In the ED was noted to have saturations of 70% also was noted to have a cough.  Patient was switched over to 100% oxygen and continued to have issues with saturation.  Patient also does have a history of COPD and that is apparent on the CT scan of the chest.  Hospital course patient was initially started on BiPAP in the emergency room with improvement in saturations.  Subsequently was switched over to high flow oxygen.  And was weaned down to 40% on 40 L.  The current problem is that his oxygen requirements have gone up.  Review of the chest x-ray actually shows improvement so it would be concerning for possibility of pulmonary vascular issues.  He also has had a drop in the bicarbonate and has grown Enterococcus in the urine so is possibly septic.  Patient has been started on antibiotics now  Review of Systems:  ROS performed and is unremarkable other than noted above.  Past Medical History:  Diagnosis Date  . Diabetes mellitus without complication (HCC)   . Hypertension     No past surgical history on file.  Social History:    reports that he has never smoked. He has never used smokeless tobacco. He reports that he does not drink alcohol and does not use drugs.  Family History: Non-Contributory to the present illness  Allergies  Allergen Reactions  . Mycophenolate Mofetil Rash     Medications: Reviewed on Rounds  Physical Exam:  Vitals: Temperature 96.5 pulse 88 respiratory rate 13 blood pressure is 136/86 saturations 92%  Ventilator Settings on high flow oxygen FiO2 of 75% with 30 L flow rate  . General: Comfortable at this time . Eyes: Grossly normal lids, irises & conjunctiva . ENT: grossly tongue is normal . Neck: no obvious mass . Cardiovascular: S1-S2 normal no gallop or rub . Respiratory: Coarse breath sounds noted bilaterally . Abdomen: Soft and nontender . Skin: no rash seen on limited exam . Musculoskeletal: not rigid . Psychiatric:unable to assess . Neurologic: no seizure no involuntary movements         Labs on Admission:  Basic Metabolic Panel: Recent Labs  Lab 04/25/2020 0419 04/25/20 0733 04/27/20 1009  NA  --  136 135  K  --  5.0 4.5  CL  --  107 106  CO2  --  16* 17*  GLUCOSE  --  107* 417*  BUN  --  23 22  CREATININE 1.25* 1.24 1.24  CALCIUM  --  8.2* 8.1*    No results for input(s): PHART, PCO2ART, PO2ART, HCO3, O2SAT in the last 168 hours.  Liver Function Tests: No results for input(s): AST, ALT, ALKPHOS, BILITOT, PROT, ALBUMIN in the last 168 hours. No results for input(s): LIPASE, AMYLASE in the last 168 hours. No results for input(s): AMMONIA in the last 168 hours.  CBC: Recent Labs  Lab 04/11/2020 0419 04/25/20  9935 04/27/20 1009  WBC 16.6* 13.6* 13.9*  HGB 12.3* 13.0 12.3*  HCT 36.9* 40.3 37.3*  MCV 67.0* 69.0* 68.6*  PLT 229 188 178    Cardiac Enzymes: No results for input(s): CKTOTAL, CKMB, CKMBINDEX, TROPONINI in the last 168 hours.  BNP (last 3 results) Recent Labs    04/03/20 1530  BNP 193.5*    ProBNP (last 3 results) No results for input(s): PROBNP in the last 8760 hours.   Radiological Exams on Admission: DG CHEST PORT 1 VIEW  Result Date: 04/27/2020 CLINICAL DATA:  Pneumonia. EXAM: PORTABLE CHEST 1 VIEW COMPARISON:  April 23, 2020. FINDINGS: The heart size and mediastinal  contours are within normal limits. No pneumothorax or pleural effusion is noted. Stable bilateral lung opacities are noted, right greater than left, consistent with multifocal pneumonia. The visualized skeletal structures are unremarkable. IMPRESSION: Stable bilateral lung opacities are noted, right greater than left, consistent with multifocal pneumonia. Electronically Signed   By: Lupita Raider M.D.   On: 04/27/2020 09:55    Assessment/Plan Active Problems:   Acute on chronic respiratory failure with hypoxia (HCC)   Severe sepsis (HCC)   COVID-19 virus infection   Emphysema with chronic bronchitis (HCC)   1. Acute on chronic respiratory failure with hypoxia patient's oxygenation appears to be getting worse has now been increased to 75% FiO2 with 30 L flow rate.  Chest x-ray really does not show worsening but still is showing the multifocal pneumonia.  Spoke with the primary care team consider getting a CT follow-up.  In addition would recommend getting an echocardiogram. 2. Severe sepsis right now patient is hemodynamically stable however her white count is going up has positive cultures patient has been started on antibiotic coverage 3. COVID-19 virus infection in recovery phase we will continue with supportive care patient was bumped up on steroids also. 4. Emphysema is apparent by the chest films and CT scan which I personally looked at would recommend continuing with steroids also nebulizers for treatment of the underlying COPD  I have personally seen and evaluated the patient, evaluated laboratory and imaging results, formulated the assessment and plan and placed orders. The Patient requires high complexity decision making with multiple systems involvement.  Case was discussed on Rounds with the Respiratory Therapy Director and the Respiratory staff Time Spent  Yevonne Pax, MD [facility] Pulmonary Critical Care Medicine Sleep Medicine

## 2020-04-30 NOTE — Progress Notes (Addendum)
Pulmonary Critical Care Medicine [facility] [place]   PULMONARY CRITICAL CARE SERVICE  PROGRESS NOTE  Date of Service: 04/30/2020  Stephen Paul  JOA:[id number]  DOB: [birth year]   DOA: May 13, 2020  Referring Physician: Carron Curie, MD  HPI: Stephen Paul is a 86 y.o. male seen for follow up of Acute on Chronic Respiratory Failure.  Patient continues on heated high flow nasal cannula at this time currently on 85% FiO2 satting well no distress.  Medications: Reviewed on Rounds  Physical Exam:  Vitals: Pulse 90 respirations 20 BP 138/87 O2 sat 93% temp 95.1  Ventilator Settings heated high flow nasal cannula 35 L 85% FiO2  . General: Comfortable at this time . Eyes: Grossly normal lids, irises & conjunctiva . ENT: grossly tongue is normal . Neck: no obvious mass . Cardiovascular: S1 S2 normal no gallop . Respiratory: Coarse breath sounds . Abdomen: soft . Skin: no rash seen on limited exam . Musculoskeletal: not rigid . Psychiatric:unable to assess . Neurologic: no seizure no involuntary movements         Lab Data:   Basic Metabolic Panel: Recent Labs  Lab 05/13/2020 0419 04/25/20 0733 04/27/20 1009  NA  --  136 135  K  --  5.0 4.5  CL  --  107 106  CO2  --  16* 17*  GLUCOSE  --  107* 417*  BUN  --  23 22  CREATININE 1.25* 1.24 1.24  CALCIUM  --  8.2* 8.1*    ABG: No results for input(s): PHART, PCO2ART, PO2ART, HCO3, O2SAT in the last 168 hours.  Liver Function Tests: No results for input(s): AST, ALT, ALKPHOS, BILITOT, PROT, ALBUMIN in the last 168 hours. No results for input(s): LIPASE, AMYLASE in the last 168 hours. No results for input(s): AMMONIA in the last 168 hours.  CBC: Recent Labs  Lab 05-13-20 0419 04/25/20 0733 04/27/20 1009  WBC 16.6* 13.6* 13.9*  HGB 12.3* 13.0 12.3*  HCT 36.9* 40.3 37.3*  MCV 67.0* 69.0* 68.6*  PLT 229 188 178    Cardiac Enzymes: No results for input(s): CKTOTAL, CKMB, CKMBINDEX, TROPONINI in the  last 168 hours.  BNP (last 3 results) Recent Labs    04/03/20 1530  BNP 193.5*    ProBNP (last 3 results) No results for input(s): PROBNP in the last 8760 hours.  Radiological Exams: ECHOCARDIOGRAM LIMITED  Result Date: 04/29/2020    ECHOCARDIOGRAM LIMITED REPORT   Patient Name:   Stephen Paul Date of Exam: 04/29/2020 Medical Rec #:  [id number]        Height:       66.0 in Accession #:    [id number]       Weight:       157.7 lb Date of Birth:  [birth year]         BSA:          1.808 m Patient Age:    88 years         BP:           139/79 mmHg Patient Gender: M                HR:           98 bpm. Exam Location:  Inpatient Procedure: Limited Echo, Cardiac Doppler and Color Doppler Indications:     Shortness of breath  History:         Patient has no prior history of Echocardiogram examinations.  COPD, Signs/Symptoms:Shortness of Breath and Resp. failure;                  Risk Factors:Hypertension and Diabetes. Sepsis, COVID+.  Sonographer:     Lavenia Atlas RDCS Referring Phys:  [id number] Florian Buff BROWN Diagnosing Phys: Orpah Cobb MD  Sonographer Comments: Technically difficult study due to poor echo windows. Image acquisition challenging due to COPD and Image acquisition challenging due to respiratory motion. IMPRESSIONS  1. Left ventricular ejection fraction, by estimation, is 55 to 60%. The left ventricle has normal function. The left ventricle has no regional wall motion abnormalities. There is mild concentric left ventricular hypertrophy. Left ventricular diastolic parameters are consistent with Grade I diastolic dysfunction (impaired relaxation).  2. Right ventricular systolic function is normal. The right ventricular size is normal. There is mildly elevated pulmonary artery systolic pressure.  3. Left atrial size was mildly dilated.  4. The mitral valve is degenerative. Trivial mitral valve regurgitation.  5. Tricuspid valve regurgitation is mild to moderate.  6. The  aortic valve is tricuspid. Aortic valve regurgitation is not visualized. Mild aortic valve sclerosis is present, with no evidence of aortic valve stenosis. FINDINGS  Left Ventricle: Left ventricular ejection fraction, by estimation, is 55 to 60%. The left ventricle has normal function. The left ventricle has no regional wall motion abnormalities. The left ventricular internal cavity size was normal in size. There is  mild concentric left ventricular hypertrophy. Left ventricular diastolic parameters are consistent with Grade I diastolic dysfunction (impaired relaxation). Right Ventricle: The right ventricular size is normal. No increase in right ventricular wall thickness. Right ventricular systolic function is normal. There is mildly elevated pulmonary artery systolic pressure. The tricuspid regurgitant velocity is 3.23  m/s, and with an assumed right atrial pressure of 3 mmHg, the estimated right ventricular systolic pressure is 44.7 mmHg. Left Atrium: Left atrial size was mildly dilated. Right Atrium: Right atrial size was normal in size. Pericardium: There is no evidence of pericardial effusion. Mitral Valve: The mitral valve is degenerative in appearance. Mild mitral annular calcification. Trivial mitral valve regurgitation. Tricuspid Valve: The tricuspid valve is normal in structure. Tricuspid valve regurgitation is mild to moderate. Aortic Valve: The aortic valve is tricuspid. Aortic valve regurgitation is not visualized. Mild aortic valve sclerosis is present, with no evidence of aortic valve stenosis. Pulmonic Valve: The pulmonic valve was not well visualized. Aorta: The aortic root is normal in size and structure. IAS/Shunts: The interatrial septum was not assessed. LEFT VENTRICLE PLAX 2D LVIDd:         2.70 cm  Diastology LVIDs:         2.00 cm  LV e' medial:  5.55 cm/s LV PW:         1.22 cm  LV e' lateral: 8.05 cm/s LV IVS:        1.30 cm LVOT diam:     2.00 cm LVOT Area:     3.14 cm  LEFT ATRIUM          Index LA diam:    3.10 cm 1.71 cm/m   AORTA Ao Root diam: 3.10 cm TRICUSPID VALVE TR Peak grad:   41.7 mmHg TR Vmax:        323.00 cm/s  SHUNTS Systemic Diam: 2.00 cm Orpah Cobb MD Electronically signed by Orpah Cobb MD Signature Date/Time: 04/29/2020/2:38:59 PM    Final     Assessment/Plan Active Problems:   Acute on chronic respiratory failure with hypoxia (HCC)   Severe  sepsis (HCC)   COVID-19 virus infection   Emphysema with chronic bronchitis (HCC)   1. Acute on chronic respiratory failure with hypoxia patient will continue heated high flow nasal cannula currently on 35 L and 85% FiO2 we will continue to wean FiO2 as tolerated.  Continue aggressive pulmonary toilet supportive measures. 2. Severe sepsis continue antibiotics as prescribed.  Hemodynamically stable at this time. 3. COVID-19 virus infection in recovery phase we will continue with supportive care patient was bumped up on steroids also. 4. Emphysema is apparent by the chest films continue nebulizers and steroids.   I have personally seen and evaluated the patient, evaluated laboratory and imaging results, formulated the assessment and plan and placed orders. The Patient requires high complexity decision making with multiple systems involvement.  Rounds were done with the Respiratory Therapy Director and Staff therapists and discussed with nursing staff also.  Yevonne Pax, MD [facility] Pulmonary Critical Care Medicine Sleep Medicine

## 2020-05-01 LAB — BASIC METABOLIC PANEL
Anion gap: 13 (ref 5–15)
BUN: 27 mg/dL — ABNORMAL HIGH (ref 8–23)
CO2: 16 mmol/L — ABNORMAL LOW (ref 22–32)
Calcium: 8.4 mg/dL — ABNORMAL LOW (ref 8.9–10.3)
Chloride: 111 mmol/L (ref 98–111)
Creatinine, Ser: 1.12 mg/dL (ref 0.61–1.24)
GFR calc non Af Amer: 59 mL/min — ABNORMAL LOW (ref >60)
Glucose, Bld: 131 mg/dL — ABNORMAL HIGH (ref 70–99)
Potassium: 4.6 mmol/L (ref 3.5–5.1)
Sodium: 140 mmol/L (ref 135–145)

## 2020-05-01 LAB — CBC
HCT: 40.3 % (ref 39.0–52.0)
Hemoglobin: 13.1 g/dL (ref 13.0–17.0)
MCH: 22.7 pg — ABNORMAL LOW (ref 26.0–34.0)
MCHC: 32.5 g/dL (ref 30.0–36.0)
MCV: 69.7 fL — ABNORMAL LOW (ref 80.0–100.0)
Platelets: 212 10*3/uL (ref 150–400)
RBC: 5.78 MIL/uL (ref 4.22–5.81)
RDW: 25.2 % — ABNORMAL HIGH (ref 11.5–15.5)
WBC: 8.6 10*3/uL (ref 4.0–10.5)
nRBC: 1.5 % — ABNORMAL HIGH (ref 0.0–0.2)

## 2020-05-01 LAB — MAGNESIUM: Magnesium: 2.1 mg/dL (ref 1.7–2.4)

## 2020-05-01 NOTE — Progress Notes (Addendum)
Pulmonary Critical Care Medicine [facility] [place]   PULMONARY CRITICAL CARE SERVICE  PROGRESS NOTE  Date of Service: 05/01/2020  CAIUS SILBERNAGEL  TFT:[id number]  DOB: [birth year]   DOA: 04/10/2020  Referring Physician: Carron Curie, MD  HPI: Stephen Paul is a 86 y.o. male seen for follow up of Acute on Chronic Respiratory Failure.  Patient continues to wean on heated high flow nasal cannula currently on 25 L 35% FiO2 satting well no distress.  Medications: Reviewed on Rounds  Physical Exam:  Vitals: Pulse 82 respirations 28 BP 146/84 O2 sat 93% temp 95.5  Ventilator Settings heated high flow nasal cannula 25 L 75% FiO2  . General: Comfortable at this time . Eyes: Grossly normal lids, irises & conjunctiva . ENT: grossly tongue is normal . Neck: no obvious mass . Cardiovascular: S1 S2 normal no gallop . Respiratory: No rales or rhonchi noted . Abdomen: soft . Skin: no rash seen on limited exam . Musculoskeletal: not rigid . Psychiatric:unable to assess . Neurologic: no seizure no involuntary movements         Lab Data:   Basic Metabolic Panel: Recent Labs  Lab 04/25/20 0733 04/27/20 1009  NA 136 135  K 5.0 4.5  CL 107 106  CO2 16* 17*  GLUCOSE 107* 417*  BUN 23 22  CREATININE 1.24 1.24  CALCIUM 8.2* 8.1*    ABG: No results for input(s): PHART, PCO2ART, PO2ART, HCO3, O2SAT in the last 168 hours.  Liver Function Tests: No results for input(s): AST, ALT, ALKPHOS, BILITOT, PROT, ALBUMIN in the last 168 hours. No results for input(s): LIPASE, AMYLASE in the last 168 hours. No results for input(s): AMMONIA in the last 168 hours.  CBC: Recent Labs  Lab 04/25/20 0733 04/27/20 1009  WBC 13.6* 13.9*  HGB 13.0 12.3*  HCT 40.3 37.3*  MCV 69.0* 68.6*  PLT 188 178    Cardiac Enzymes: No results for input(s): CKTOTAL, CKMB, CKMBINDEX, TROPONINI in the last 168 hours.  BNP (last 3 results) Recent Labs    04/03/20 1530  BNP 193.5*     ProBNP (last 3 results) No results for input(s): PROBNP in the last 8760 hours.  Radiological Exams: ECHOCARDIOGRAM LIMITED  Result Date: 04/29/2020    ECHOCARDIOGRAM LIMITED REPORT   Patient Name:   Stephen Paul Date of Exam: 04/29/2020 Medical Rec #:  [id number]        Height:       66.0 in Accession #:    [id number]       Weight:       157.7 lb Date of Birth:  25-Nov-1931         BSA:          1.808 m Patient Age:    88 years         BP:           139/79 mmHg Patient Gender: M                HR:           98 bpm. Exam Location:  Inpatient Procedure: Limited Echo, Cardiac Doppler and Color Doppler Indications:     Shortness of breath  History:         Patient has no prior history of Echocardiogram examinations.                  COPD, Signs/Symptoms:Shortness of Breath and Resp. failure;  Risk Factors:Hypertension and Diabetes. Sepsis, COVID+.  Sonographer:     Lavenia Atlas RDCS Referring Phys:  [id number] Florian Buff BROWN Diagnosing Phys: Orpah Cobb MD  Sonographer Comments: Technically difficult study due to poor echo windows. Image acquisition challenging due to COPD and Image acquisition challenging due to respiratory motion. IMPRESSIONS  1. Left ventricular ejection fraction, by estimation, is 55 to 60%. The left ventricle has normal function. The left ventricle has no regional wall motion abnormalities. There is mild concentric left ventricular hypertrophy. Left ventricular diastolic parameters are consistent with Grade I diastolic dysfunction (impaired relaxation).  2. Right ventricular systolic function is normal. The right ventricular size is normal. There is mildly elevated pulmonary artery systolic pressure.  3. Left atrial size was mildly dilated.  4. The mitral valve is degenerative. Trivial mitral valve regurgitation.  5. Tricuspid valve regurgitation is mild to moderate.  6. The aortic valve is tricuspid. Aortic valve regurgitation is not visualized. Mild aortic  valve sclerosis is present, with no evidence of aortic valve stenosis. FINDINGS  Left Ventricle: Left ventricular ejection fraction, by estimation, is 55 to 60%. The left ventricle has normal function. The left ventricle has no regional wall motion abnormalities. The left ventricular internal cavity size was normal in size. There is  mild concentric left ventricular hypertrophy. Left ventricular diastolic parameters are consistent with Grade I diastolic dysfunction (impaired relaxation). Right Ventricle: The right ventricular size is normal. No increase in right ventricular wall thickness. Right ventricular systolic function is normal. There is mildly elevated pulmonary artery systolic pressure. The tricuspid regurgitant velocity is 3.23  m/s, and with an assumed right atrial pressure of 3 mmHg, the estimated right ventricular systolic pressure is 44.7 mmHg. Left Atrium: Left atrial size was mildly dilated. Right Atrium: Right atrial size was normal in size. Pericardium: There is no evidence of pericardial effusion. Mitral Valve: The mitral valve is degenerative in appearance. Mild mitral annular calcification. Trivial mitral valve regurgitation. Tricuspid Valve: The tricuspid valve is normal in structure. Tricuspid valve regurgitation is mild to moderate. Aortic Valve: The aortic valve is tricuspid. Aortic valve regurgitation is not visualized. Mild aortic valve sclerosis is present, with no evidence of aortic valve stenosis. Pulmonic Valve: The pulmonic valve was not well visualized. Aorta: The aortic root is normal in size and structure. IAS/Shunts: The interatrial septum was not assessed. LEFT VENTRICLE PLAX 2D LVIDd:         2.70 cm  Diastology LVIDs:         2.00 cm  LV e' medial:  5.55 cm/s LV PW:         1.22 cm  LV e' lateral: 8.05 cm/s LV IVS:        1.30 cm LVOT diam:     2.00 cm LVOT Area:     3.14 cm  LEFT ATRIUM         Index LA diam:    3.10 cm 1.71 cm/m   AORTA Ao Root diam: 3.10 cm TRICUSPID VALVE  TR Peak grad:   41.7 mmHg TR Vmax:        323.00 cm/s  SHUNTS Systemic Diam: 2.00 cm Orpah Cobb MD Electronically signed by Orpah Cobb MD Signature Date/Time: 04/29/2020/2:38:59 PM    Final     Assessment/Plan Active Problems:   Acute on chronic respiratory failure with hypoxia (HCC)   Severe sepsis (HCC)   COVID-19 virus infection   Emphysema with chronic bronchitis (HCC)   1. Acute on chronic respiratory failure with hypoxia  patient will continue heated high flow nasal cannula currently on 35 L and 75% FiO2 we will continue to wean FiO2 as tolerated.  Continue aggressive pulmonary toilet supportive measures. 2. Severe sepsis continue antibiotics as prescribed.  Hemodynamically stable at this time. 3. COVID-19 virus infection in recovery phase we will continue with supportive care patient was bumped up on steroids also. 4. Emphysema is apparent by the chest films continue nebulizers and steroids.   I have personally seen and evaluated the patient, evaluated laboratory and imaging results, formulated the assessment and plan and placed orders. The Patient requires high complexity decision making with multiple systems involvement.  Rounds were done with the Respiratory Therapy Director and Staff therapists and discussed with nursing staff also.  Yevonne Pax, MD [facility] Pulmonary Critical Care Medicine Sleep Medicine

## 2020-05-02 LAB — CULTURE, BLOOD (ROUTINE X 2)
Culture: NO GROWTH
Culture: NO GROWTH

## 2020-05-02 NOTE — Progress Notes (Signed)
Pulmonary Critical Care Medicine [facility] [place]   PULMONARY CRITICAL CARE SERVICE  PROGRESS NOTE  Date of Service: 05/02/2020  Stephen Paul  JJO:[id number]  DOB: 12-24-1931   DOA: 04/06/2020  Referring Physician: Carron Curie, MD  HPI: Stephen Paul is a 86 y.o. male seen for follow up of Acute on Chronic Respiratory Failure.  Patient is on 20 L of oxygen at 80% FiO2 saturations are 95 to 97%  Medications: Reviewed on Rounds  Physical Exam:  Vitals: Temperature is 95.8 pulse 88 respiratory rate 22 blood pressure is 121/82 saturations 95%  Ventilator Settings off the ventilator on high flow oxygen  . General: Comfortable at this time . Eyes: Grossly normal lids, irises & conjunctiva . ENT: grossly tongue is normal . Neck: no obvious mass . Cardiovascular: S1 S2 normal no gallop . Respiratory: No rhonchi very coarse breath sounds . Abdomen: soft . Skin: no rash seen on limited exam . Musculoskeletal: not rigid . Psychiatric:unable to assess . Neurologic: no seizure no involuntary movements         Lab Data:   Basic Metabolic Panel: Recent Labs  Lab 04/27/20 1009 05/01/20 1018  NA 135 140  K 4.5 4.6  CL 106 111  CO2 17* 16*  GLUCOSE 417* 131*  BUN 22 27*  CREATININE 1.24 1.12  CALCIUM 8.1* 8.4*  MG  --  2.1    ABG: No results for input(s): PHART, PCO2ART, PO2ART, HCO3, O2SAT in the last 168 hours.  Liver Function Tests: No results for input(s): AST, ALT, ALKPHOS, BILITOT, PROT, ALBUMIN in the last 168 hours. No results for input(s): LIPASE, AMYLASE in the last 168 hours. No results for input(s): AMMONIA in the last 168 hours.  CBC: Recent Labs  Lab 04/27/20 1009 05/01/20 1018  WBC 13.9* 8.6  HGB 12.3* 13.1  HCT 37.3* 40.3  MCV 68.6* 69.7*  PLT 178 212    Cardiac Enzymes: No results for input(s): CKTOTAL, CKMB, CKMBINDEX, TROPONINI in the last 168 hours.  BNP (last 3 results) Recent Labs    04/03/20 1530  BNP 193.5*     ProBNP (last 3 results) No results for input(s): PROBNP in the last 8760 hours.  Radiological Exams: No results found.  Assessment/Plan Active Problems:   Acute on chronic respiratory failure with hypoxia (HCC)   Severe sepsis (HCC)   COVID-19 virus infection   Emphysema with chronic bronchitis (HCC)   1. Acute on chronic respiratory failure hypoxia we will continue with on oxygen therapy titrate as tolerated.  Continue secretion management pulmonary toilet. 2. Severe sepsis resolved 3. COVID-19 virus infection in resolution 4. Emphysema on medical management nebulizers as needed   I have personally seen and evaluated the patient, evaluated laboratory and imaging results, formulated the assessment and plan and placed orders. The Patient requires high complexity decision making with multiple systems involvement.  Rounds were done with the Respiratory Therapy Director and Staff therapists and discussed with nursing staff also.  Yevonne Pax, MD [facility] Pulmonary Critical Care Medicine Sleep Medicine

## 2020-05-03 LAB — BLOOD GAS, ARTERIAL
Acid-base deficit: 9.1 mmol/L — ABNORMAL HIGH (ref 0.0–2.0)
Bicarbonate: 14.2 mmol/L — ABNORMAL LOW (ref 20.0–28.0)
FIO2: 100
O2 Saturation: 99.1 %
Patient temperature: 37
pCO2 arterial: 20.9 mmHg — ABNORMAL LOW (ref 32.0–48.0)
pH, Arterial: 7.448 (ref 7.350–7.450)
pO2, Arterial: 159 mmHg — ABNORMAL HIGH (ref 83.0–108.0)

## 2020-05-03 LAB — URINALYSIS, ROUTINE W REFLEX MICROSCOPIC
Bilirubin Urine: NEGATIVE
Glucose, UA: 50 mg/dL — AB
Hgb urine dipstick: NEGATIVE
Ketones, ur: NEGATIVE mg/dL
Leukocytes,Ua: NEGATIVE
Nitrite: NEGATIVE
Protein, ur: NEGATIVE mg/dL
Specific Gravity, Urine: 1.009 (ref 1.005–1.030)
pH: 5 (ref 5.0–8.0)

## 2020-05-03 NOTE — Progress Notes (Addendum)
Pulmonary Critical Care Medicine [facility] [place]   PULMONARY CRITICAL CARE SERVICE  PROGRESS NOTE  Date of Service: 05/03/2020  Stephen Paul  [id number]:[id number]  DOB: Nov 26, 1931   DOA: 04/07/2020  Referring Physician: Carron Curie, MD  HPI: Stephen Paul is a 86 y.o. male seen for follow up of Acute on Chronic Respiratory Failure.  Patient remains on heated high flow nasal cannula currently on 25 L 60% FiO2 satting well no distress.  Medications: Reviewed on Rounds  Physical Exam:  Vitals: Pulse 85 respirations 12 BP 140/84 O2 sat 95% temp 95.7  Ventilator Settings heated high flow 25 L and 60% FiO2  . General: Comfortable at this time . Eyes: Grossly normal lids, irises & conjunctiva . ENT: grossly tongue is normal . Neck: no obvious mass . Cardiovascular: S1 S2 normal no gallop . Respiratory: No rales or rhonchi noted . Abdomen: soft . Skin: no rash seen on limited exam . Musculoskeletal: not rigid . Psychiatric:unable to assess . Neurologic: no seizure no involuntary movements         Lab Data:   Basic Metabolic Panel: Recent Labs  Lab 04/27/20 1009 05/01/20 1018  NA 135 140  K 4.5 4.6  CL 106 111  CO2 17* 16*  GLUCOSE 417* 131*  BUN 22 27*  CREATININE 1.24 1.12  CALCIUM 8.1* 8.4*  MG  --  2.1    ABG: Recent Labs  Lab 05/03/20 1103  PHART 7.448  PCO2ART 20.9*  PO2ART 159*  HCO3 14.2*  O2SAT 99.1    Liver Function Tests: No results for input(s): AST, ALT, ALKPHOS, BILITOT, PROT, ALBUMIN in the last 168 hours. No results for input(s): LIPASE, AMYLASE in the last 168 hours. No results for input(s): AMMONIA in the last 168 hours.  CBC: Recent Labs  Lab 04/27/20 1009 05/01/20 1018  WBC 13.9* 8.6  HGB 12.3* 13.1  HCT 37.3* 40.3  MCV 68.6* 69.7*  PLT 178 212    Cardiac Enzymes: No results for input(s): CKTOTAL, CKMB, CKMBINDEX, TROPONINI in the last 168 hours.  BNP (last 3 results) Recent Labs    04/03/20 1530  BNP  193.5*    ProBNP (last 3 results) No results for input(s): PROBNP in the last 8760 hours.  Radiological Exams: No results found.  Assessment/Plan Active Problems:   Acute on chronic respiratory failure with hypoxia (HCC)   Severe sepsis (HCC)   COVID-19 virus infection   Emphysema with chronic bronchitis (HCC)   1. Acute on chronic respiratory failure hypoxia patient continues on heated high flow currently on 60% FiO2 we will continue to wean as tolerated.  Satting well at this time continue supportive measures. 2. COVID-19 virus infection in resolution 3. Emphysema on medical management nebulizers as needed   I have personally seen and evaluated the patient, evaluated laboratory and imaging results, formulated the assessment and plan and placed orders. The Patient requires high complexity decision making with multiple systems involvement.  Rounds were done with the Respiratory Therapy Director and Staff therapists and discussed with nursing staff also.  Yevonne Pax, MD [facility] Pulmonary Critical Care Medicine Sleep Medicine

## 2020-05-04 ENCOUNTER — Other Ambulatory Visit (HOSPITAL_COMMUNITY): Payer: Medicare Other

## 2020-05-04 LAB — CBC
HCT: 30.5 % — ABNORMAL LOW (ref 39.0–52.0)
Hemoglobin: 10.1 g/dL — ABNORMAL LOW (ref 13.0–17.0)
MCH: 22.9 pg — ABNORMAL LOW (ref 26.0–34.0)
MCHC: 33.1 g/dL (ref 30.0–36.0)
MCV: 69 fL — ABNORMAL LOW (ref 80.0–100.0)
Platelets: 177 10*3/uL (ref 150–400)
RBC: 4.42 MIL/uL (ref 4.22–5.81)
RDW: 25.1 % — ABNORMAL HIGH (ref 11.5–15.5)
WBC: 5.1 10*3/uL (ref 4.0–10.5)
nRBC: 0.4 % — ABNORMAL HIGH (ref 0.0–0.2)

## 2020-05-04 LAB — BASIC METABOLIC PANEL
Anion gap: 14 (ref 5–15)
BUN: 40 mg/dL — ABNORMAL HIGH (ref 8–23)
CO2: 14 mmol/L — ABNORMAL LOW (ref 22–32)
Calcium: 8.3 mg/dL — ABNORMAL LOW (ref 8.9–10.3)
Chloride: 112 mmol/L — ABNORMAL HIGH (ref 98–111)
Creatinine, Ser: 1.24 mg/dL (ref 0.61–1.24)
GFR, Estimated: 52 mL/min — ABNORMAL LOW (ref >60)
Glucose, Bld: 207 mg/dL — ABNORMAL HIGH (ref 70–99)
Potassium: 4.5 mmol/L (ref 3.5–5.1)
Sodium: 140 mmol/L (ref 135–145)

## 2020-05-04 MED ORDER — IOHEXOL 350 MG/ML SOLN
70.0000 mL | Freq: Once | INTRAVENOUS | Status: AC | PRN
  Administered 2020-05-04: 70 mL via INTRAVENOUS

## 2020-05-04 NOTE — Progress Notes (Signed)
Pulmonary Critical Care Medicine [facility] [place]   PULMONARY CRITICAL CARE SERVICE  PROGRESS NOTE  Date of Service: 05/04/2020  Stephen Paul  [id number]:[id number]  DOB: [birth year]   DOA: 03/27/2020  Referring Physician: Carron Curie, MD  HPI: Stephen Paul is a 86 y.o. male seen for follow up of Acute on Chronic Respiratory Failure.  Patient currently is on high flow oxygen his ABG that was done yesterday actually looks quite good.  We should be able to try to wean the FiO2 down further  Medications: Reviewed on Rounds  Physical Exam:  Vitals: Temperature is 96.5 pulse 90 respiratory rate 30 blood pressure is 147/77 saturations 91%  Ventilator Settings on high flow nasal cannula  . General: Comfortable at this time . Eyes: Grossly normal lids, irises & conjunctiva . ENT: grossly tongue is normal . Neck: no obvious mass . Cardiovascular: S1 S2 normal no gallop . Respiratory: Scattered rhonchi expansion is equal . Abdomen: soft . Skin: no rash seen on limited exam . Musculoskeletal: not rigid . Psychiatric:unable to assess . Neurologic: no seizure no involuntary movements         Lab Data:   Basic Metabolic Panel: Recent Labs  Lab 05/01/20 1018 05/04/20 0624  NA 140 140  K 4.6 4.5  CL 111 112*  CO2 16* 14*  GLUCOSE 131* 207*  BUN 27* 40*  CREATININE 1.12 1.24  CALCIUM 8.4* 8.3*  MG 2.1  --     ABG: Recent Labs  Lab 05/03/20 1103  PHART 7.448  PCO2ART 20.9*  PO2ART 159*  HCO3 14.2*  O2SAT 99.1    Liver Function Tests: No results for input(s): AST, ALT, ALKPHOS, BILITOT, PROT, ALBUMIN in the last 168 hours. No results for input(s): LIPASE, AMYLASE in the last 168 hours. No results for input(s): AMMONIA in the last 168 hours.  CBC: Recent Labs  Lab 05/01/20 1018 05/04/20 0436  WBC 8.6 5.1  HGB 13.1 10.1*  HCT 40.3 30.5*  MCV 69.7* 69.0*  PLT 212 177    Cardiac Enzymes: No results for input(s): CKTOTAL, CKMB, CKMBINDEX,  TROPONINI in the last 168 hours.  BNP (last 3 results) Recent Labs    04/03/20 1530  BNP 193.5*    ProBNP (last 3 results) No results for input(s): PROBNP in the last 8760 hours.  Radiological Exams: No results found.  Assessment/Plan Active Problems:   Acute on chronic respiratory failure with hypoxia (HCC)   Severe sepsis (HCC)   COVID-19 virus infection   Emphysema with chronic bronchitis (HCC)   1. Acute on chronic respiratory failure with hypoxia we will continue with the heated high flow titration down.  Once we are down to an acceptable level of asked for respiratory therapy to start on Oxymizer. 2. Severe sepsis resolved we will continue with supportive care. 3. COVID-19 virus infection slow improvement 4. Emphysema we will continue with supportive care medical management   I have personally seen and evaluated the patient, evaluated laboratory and imaging results, formulated the assessment and plan and placed orders. The Patient requires high complexity decision making with multiple systems involvement.  Rounds were done with the Respiratory Therapy Director and Staff therapists and discussed with nursing staff also.  Yevonne Pax, MD [facility] Pulmonary Critical Care Medicine Sleep Medicine

## 2020-05-05 NOTE — Progress Notes (Signed)
Pulmonary Critical Care Medicine [facility] [place]   PULMONARY CRITICAL CARE SERVICE  PROGRESS NOTE  Date of Service: 05/05/2020  Stephen Paul  [id number]:[id number]  DOB: [birth year]   DOA: 05-09-2020  Referring Physician: Carron Curie, MD  HPI: Stephen Paul is a 86 y.o. male seen for follow up of Acute on Chronic Respiratory Failure.  Patient is on high flow oxygen been having some issues with nocturnal desaturations and respiratory therapy has been increasingly FiO2.  CT scan did not show any pulmonary embolism  Medications: Reviewed on Rounds  Physical Exam:  Vitals: Temperature is 96.1 pulse 85 respiratory 15 blood pressure is 154/86 saturations 92%  Ventilator Settings on high flow nasal cannula  . General: Comfortable at this time . Eyes: Grossly normal lids, irises & conjunctiva . ENT: grossly tongue is normal . Neck: no obvious mass . Cardiovascular: S1 S2 normal no gallop . Respiratory: No rhonchi very coarse breath sounds . Abdomen: soft . Skin: no rash seen on limited exam . Musculoskeletal: not rigid . Psychiatric:unable to assess . Neurologic: no seizure no involuntary movements         Lab Data:   Basic Metabolic Panel: Recent Labs  Lab 05/01/20 1018 05/04/20 0624  NA 140 140  K 4.6 4.5  CL 111 112*  CO2 16* 14*  GLUCOSE 131* 207*  BUN 27* 40*  CREATININE 1.12 1.24  CALCIUM 8.4* 8.3*  MG 2.1  --     ABG: Recent Labs  Lab 05/03/20 1103  PHART 7.448  PCO2ART 20.9*  PO2ART 159*  HCO3 14.2*  O2SAT 99.1    Liver Function Tests: No results for input(s): AST, ALT, ALKPHOS, BILITOT, PROT, ALBUMIN in the last 168 hours. No results for input(s): LIPASE, AMYLASE in the last 168 hours. No results for input(s): AMMONIA in the last 168 hours.  CBC: Recent Labs  Lab 05/01/20 1018 05/04/20 0436  WBC 8.6 5.1  HGB 13.1 10.1*  HCT 40.3 30.5*  MCV 69.7* 69.0*  PLT 212 177    Cardiac Enzymes: No results for input(s): CKTOTAL,  CKMB, CKMBINDEX, TROPONINI in the last 168 hours.  BNP (last 3 results) Recent Labs    04/03/20 1530  BNP 193.5*    ProBNP (last 3 results) No results for input(s): PROBNP in the last 8760 hours.  Radiological Exams: CT ANGIO CHEST PE W OR WO CONTRAST  Result Date: 05/04/2020 CLINICAL DATA:  Acute respiratory failure. Evaluate for pulmonary embolus. EXAM: CT ANGIOGRAPHY CHEST WITH CONTRAST TECHNIQUE: Multidetector CT imaging of the chest was performed using the standard protocol during bolus administration of intravenous contrast. Multiplanar CT image reconstructions and MIPs were obtained to evaluate the vascular anatomy. CONTRAST:  86mL OMNIPAQUE IOHEXOL 350 MG/ML SOLN COMPARISON:  CT chest April 03, 2020 FINDINGS: Cardiovascular: Normal heart size. Aorta and main pulmonary artery normal in caliber. Thoracic aortic vascular calcifications. Adequate opacification of the pulmonary arterial system. No filling defects identified to suggest acute pulmonary embolus. Mediastinum/Nodes: No enlarged axillary, mediastinal or hilar lymphadenopathy. Normal appearance of the esophagus. Lungs/Pleura: Central airways are patent. Fairly extensive patchy bilateral ground-glass and consolidative opacities with a lower lobe predominance. Centrilobular and paraseptal emphysematous change. No pleural effusion or pneumothorax. Upper Abdomen: No acute process. Musculoskeletal: Thoracic spine degenerative changes. No aggressive or acute appearing osseous lesions. Review of the MIP images confirms the above findings. IMPRESSION: 1. No evidence for acute pulmonary embolus. 2. Fairly extensive patchy bilateral ground-glass and consolidative opacities with a lower lobe predominance. Findings most  compatible with atypical/viral infectious process. 3. Emphysema and aortic atherosclerosis. Electronically Signed   By: Annia Belt M.D.   On: 05/04/2020 18:55   DG CHEST PORT 1 VIEW  Result Date: 05/04/2020 CLINICAL DATA:   Respiratory failure EXAM: PORTABLE CHEST 1 VIEW COMPARISON:  April 27, 2020 FINDINGS: The mediastinal contour and cardiac silhouette are stable. Patchy pneumonia of the right mid and lung bases slightly improved. Patchy opacity of the left mid to lung base is improved. Mild increased pulmonary interstitium is identified bilaterally stable. Bony structures are stable. IMPRESSION: Bilateral pneumonias, slightly improved compared to prior exam. Electronically Signed   By: Sherian Rein M.D.   On: 05/04/2020 13:15    Assessment/Plan Active Problems:   Acute on chronic respiratory failure with hypoxia (HCC)   Severe sepsis (HCC)   COVID-19 virus infection   Emphysema with chronic bronchitis (HCC)   1. Acute on chronic respiratory failure with hypoxia we will continue with optimal oxygen therapy titrate as tolerated.  Patient still has significant bilateral disease from the Covid. 2. Severe sepsis resolved hemodynamics are stable 3. COVID-19 virus infection in recovery phase 4. Emphysema supportive care we will continue to follow along   I have personally seen and evaluated the patient, evaluated laboratory and imaging results, formulated the assessment and plan and placed orders. The Patient requires high complexity decision making with multiple systems involvement.  Rounds were done with the Respiratory Therapy Director and Staff therapists and discussed with nursing staff also.  Yevonne Pax, MD [facility] Pulmonary Critical Care Medicine Sleep Medicine

## 2020-05-06 NOTE — Progress Notes (Signed)
Pulmonary Critical Care Medicine [facility] [place]   PULMONARY CRITICAL CARE SERVICE  PROGRESS NOTE  Date of Service: 05/06/2020  Stephen Paul  [id number]:[id number]  DOB: September 30, 1931   DOA: May 24, 2020  Referring Physician: Carron Curie, MD  HPI: Stephen Paul is a 86 y.o. male seen for follow up of Acute on Chronic Respiratory Failure.  Patient at this time is on heated high flow on 25 L oxygen requirements have been waxing and waning.  Right now is on 75%.  Medications: Reviewed on Rounds  Physical Exam:  Vitals: Temperature is 96.8 pulse 78 respiratory rate 13 blood pressure is 140/82 saturations 94%  Ventilator Settings on heated high flow off the ventilator.  . General: Comfortable at this time . Eyes: Grossly normal lids, irises & conjunctiva . ENT: grossly tongue is normal . Neck: no obvious mass . Cardiovascular: S1 S2 normal no gallop . Respiratory: No rhonchi very coarse breath sounds . Abdomen: soft . Skin: no rash seen on limited exam . Musculoskeletal: not rigid . Psychiatric:unable to assess . Neurologic: no seizure no involuntary movements         Lab Data:   Basic Metabolic Panel: Recent Labs  Lab 05/01/20 1018 05/04/20 0624  NA 140 140  K 4.6 4.5  CL 111 112*  CO2 16* 14*  GLUCOSE 131* 207*  BUN 27* 40*  CREATININE 1.12 1.24  CALCIUM 8.4* 8.3*  MG 2.1  --     ABG: Recent Labs  Lab 05/03/20 1103  PHART 7.448  PCO2ART 20.9*  PO2ART 159*  HCO3 14.2*  O2SAT 99.1    Liver Function Tests: No results for input(s): AST, ALT, ALKPHOS, BILITOT, PROT, ALBUMIN in the last 168 hours. No results for input(s): LIPASE, AMYLASE in the last 168 hours. No results for input(s): AMMONIA in the last 168 hours.  CBC: Recent Labs  Lab 05/01/20 1018 05/04/20 0436  WBC 8.6 5.1  HGB 13.1 10.1*  HCT 40.3 30.5*  MCV 69.7* 69.0*  PLT 212 177    Cardiac Enzymes: No results for input(s): CKTOTAL, CKMB, CKMBINDEX, TROPONINI in the last 168  hours.  BNP (last 3 results) Recent Labs    04/03/20 1530  BNP 193.5*    ProBNP (last 3 results) No results for input(s): PROBNP in the last 8760 hours.  Radiological Exams: CT ANGIO CHEST PE W OR WO CONTRAST  Result Date: 05/04/2020 CLINICAL DATA:  Acute respiratory failure. Evaluate for pulmonary embolus. EXAM: CT ANGIOGRAPHY CHEST WITH CONTRAST TECHNIQUE: Multidetector CT imaging of the chest was performed using the standard protocol during bolus administration of intravenous contrast. Multiplanar CT image reconstructions and MIPs were obtained to evaluate the vascular anatomy. CONTRAST:  4mL OMNIPAQUE IOHEXOL 350 MG/ML SOLN COMPARISON:  CT chest April 03, 2020 FINDINGS: Cardiovascular: Normal heart size. Aorta and main pulmonary artery normal in caliber. Thoracic aortic vascular calcifications. Adequate opacification of the pulmonary arterial system. No filling defects identified to suggest acute pulmonary embolus. Mediastinum/Nodes: No enlarged axillary, mediastinal or hilar lymphadenopathy. Normal appearance of the esophagus. Lungs/Pleura: Central airways are patent. Fairly extensive patchy bilateral ground-glass and consolidative opacities with a lower lobe predominance. Centrilobular and paraseptal emphysematous change. No pleural effusion or pneumothorax. Upper Abdomen: No acute process. Musculoskeletal: Thoracic spine degenerative changes. No aggressive or acute appearing osseous lesions. Review of the MIP images confirms the above findings. IMPRESSION: 1. No evidence for acute pulmonary embolus. 2. Fairly extensive patchy bilateral ground-glass and consolidative opacities with a lower lobe predominance. Findings most compatible  with atypical/viral infectious process. 3. Emphysema and aortic atherosclerosis. Electronically Signed   By: Annia Belt M.D.   On: 05/04/2020 18:55   DG CHEST PORT 1 VIEW  Result Date: 05/04/2020 CLINICAL DATA:  Respiratory failure EXAM: PORTABLE CHEST 1  VIEW COMPARISON:  April 27, 2020 FINDINGS: The mediastinal contour and cardiac silhouette are stable. Patchy pneumonia of the right mid and lung bases slightly improved. Patchy opacity of the left mid to lung base is improved. Mild increased pulmonary interstitium is identified bilaterally stable. Bony structures are stable. IMPRESSION: Bilateral pneumonias, slightly improved compared to prior exam. Electronically Signed   By: Sherian Rein M.D.   On: 05/04/2020 13:15    Assessment/Plan Active Problems:   Acute on chronic respiratory failure with hypoxia (HCC)   Severe sepsis (HCC)   COVID-19 virus infection   Emphysema with chronic bronchitis (HCC)   1. Acute on chronic respiratory failure hypoxia we will continue to try to titrate oxygen down.  Chest x-ray still showing some infiltrates but somewhat improved we will continue with supportive care CT scan results as already noted above. 2. Severe sepsis resolved hemodynamics are stable. 3. COVID-19 virus infection in recovery phase 4. Chronic bronchitis emphysema supportive care nebulizers as ordered.   I have personally seen and evaluated the patient, evaluated laboratory and imaging results, formulated the assessment and plan and placed orders. The Patient requires high complexity decision making with multiple systems involvement.  Rounds were done with the Respiratory Therapy Director and Staff therapists and discussed with nursing staff also.  Yevonne Pax, MD [facility] Pulmonary Critical Care Medicine Sleep Medicine

## 2020-05-07 NOTE — Progress Notes (Addendum)
Pulmonary Critical Care Medicine [facility] [place]   PULMONARY CRITICAL CARE SERVICE  PROGRESS NOTE  Date of Service: 05/07/2020  Stephen Paul  POE:[id number]  DOB: 1931-12-09   DOA: 04/21/2020  Referring Physician: Carron Curie, MD  HPI: Stephen Paul is a 86 y.o. male seen for follow up of Acute on Chronic Respiratory Failure.  Patient remains on 9 L Oxymizer at this time satting well no distress.  Medications: Reviewed on Rounds  Physical Exam:  Vitals: Pulse 84 respirations 15 BP 117/78 O2 sat 96% temp 95.0  Ventilator Settings 9 L Oxymizer  . General: Comfortable at this time . Eyes: Grossly normal lids, irises & conjunctiva . ENT: grossly tongue is normal . Neck: no obvious mass . Cardiovascular: S1 S2 normal no gallop . Respiratory: No rales or rhonchi noted . Abdomen: soft . Skin: no rash seen on limited exam . Musculoskeletal: not rigid . Psychiatric:unable to assess . Neurologic: no seizure no involuntary movements         Lab Data:   Basic Metabolic Panel: Recent Labs  Lab 05/01/20 1018 05/04/20 0624  NA 140 140  K 4.6 4.5  CL 111 112*  CO2 16* 14*  GLUCOSE 131* 207*  BUN 27* 40*  CREATININE 1.12 1.24  CALCIUM 8.4* 8.3*  MG 2.1  --     ABG: Recent Labs  Lab 05/03/20 1103  PHART 7.448  PCO2ART 20.9*  PO2ART 159*  HCO3 14.2*  O2SAT 99.1    Liver Function Tests: No results for input(s): AST, ALT, ALKPHOS, BILITOT, PROT, ALBUMIN in the last 168 hours. No results for input(s): LIPASE, AMYLASE in the last 168 hours. No results for input(s): AMMONIA in the last 168 hours.  CBC: Recent Labs  Lab 05/01/20 1018 05/04/20 0436  WBC 8.6 5.1  HGB 13.1 10.1*  HCT 40.3 30.5*  MCV 69.7* 69.0*  PLT 212 177    Cardiac Enzymes: No results for input(s): CKTOTAL, CKMB, CKMBINDEX, TROPONINI in the last 168 hours.  BNP (last 3 results) Recent Labs    04/03/20 1530  BNP 193.5*    ProBNP (last 3 results) No results for  input(s): PROBNP in the last 8760 hours.  Radiological Exams: No results found.  Assessment/Plan Active Problems:   Acute on chronic respiratory failure with hypoxia (HCC)   Severe sepsis (HCC)   COVID-19 virus infection   Emphysema with chronic bronchitis (HCC)   1. Acute on chronic respiratory failure hypoxia patient will continue on Oxymizer at this time we will continue to wean down oxygen as tolerated.  Continue supportive measures and pulmonary toilet. 2. Severe sepsis resolved hemodynamics are stable. 3. COVID-19 virus infection in recovery phase 4. Chronic bronchitis emphysema supportive care nebulizers as ordered.   I have personally seen and evaluated the patient, evaluated laboratory and imaging results, formulated the assessment and plan and placed orders. The Patient requires high complexity decision making with multiple systems involvement.  Rounds were done with the Respiratory Therapy Director and Staff therapists and discussed with nursing staff also.  Yevonne Pax, MD [facility] Pulmonary Critical Care Medicine Sleep Medicine

## 2020-05-08 ENCOUNTER — Other Ambulatory Visit (HOSPITAL_COMMUNITY): Payer: Medicare Other

## 2020-05-08 LAB — BASIC METABOLIC PANEL
Anion gap: 13 (ref 5–15)
BUN: 32 mg/dL — ABNORMAL HIGH (ref 8–23)
CO2: 16 mmol/L — ABNORMAL LOW (ref 22–32)
Calcium: 8.8 mg/dL — ABNORMAL LOW (ref 8.9–10.3)
Chloride: 113 mmol/L — ABNORMAL HIGH (ref 98–111)
Creatinine, Ser: 1.13 mg/dL (ref 0.61–1.24)
GFR, Estimated: 59 mL/min — ABNORMAL LOW (ref >60)
Glucose, Bld: 152 mg/dL — ABNORMAL HIGH (ref 70–99)
Potassium: 4.5 mmol/L (ref 3.5–5.1)
Sodium: 142 mmol/L (ref 135–145)

## 2020-05-08 LAB — CBC
HCT: 40.8 % (ref 39.0–52.0)
Hemoglobin: 13.3 g/dL (ref 13.0–17.0)
MCH: 23.5 pg — ABNORMAL LOW (ref 26.0–34.0)
MCHC: 32.6 g/dL (ref 30.0–36.0)
MCV: 72 fL — ABNORMAL LOW (ref 80.0–100.0)
Platelets: 149 10*3/uL — ABNORMAL LOW (ref 150–400)
RBC: 5.67 MIL/uL (ref 4.22–5.81)
RDW: 26.7 % — ABNORMAL HIGH (ref 11.5–15.5)
WBC: 4.7 10*3/uL (ref 4.0–10.5)
nRBC: 20.7 % — ABNORMAL HIGH (ref 0.0–0.2)

## 2020-05-08 NOTE — Progress Notes (Addendum)
Pulmonary Critical Care Medicine [facility] [place]   PULMONARY CRITICAL CARE SERVICE  PROGRESS NOTE  Date of Service: 05/08/2020  Stephen Paul  TOI:[id number]  DOB: 08/15/1931   DOA: 05/07/2020  Referring Physician: Carron Curie, MD  HPI: Stephen Paul is a 86 y.o. male seen for follow up of Acute on Chronic Respiratory Failure.  Patient is currently on 10 L Oxymizer satting well no distress.  Medications: Reviewed on Rounds  Physical Exam:  Vitals: Pulse 87 respirations 14 BP 142/87 O2 sat 97% temp 94.4  Ventilator Settings not currently on ventilator  . General: Comfortable at this time . Eyes: Grossly normal lids, irises & conjunctiva . ENT: grossly tongue is normal . Neck: no obvious mass . Cardiovascular: S1 S2 normal no gallop . Respiratory: No rales or rhonchi noted . Abdomen: soft . Skin: no rash seen on limited exam . Musculoskeletal: not rigid . Psychiatric:unable to assess . Neurologic: no seizure no involuntary movements         Lab Data:   Basic Metabolic Panel: Recent Labs  Lab 05/04/20 0624 05/08/20 0413  NA 140 142  K 4.5 4.5  CL 112* 113*  CO2 14* 16*  GLUCOSE 207* 152*  BUN 40* 32*  CREATININE 1.24 1.13  CALCIUM 8.3* 8.8*    ABG: Recent Labs  Lab 05/03/20 1103  PHART 7.448  PCO2ART 20.9*  PO2ART 159*  HCO3 14.2*  O2SAT 99.1    Liver Function Tests: No results for input(s): AST, ALT, ALKPHOS, BILITOT, PROT, ALBUMIN in the last 168 hours. No results for input(s): LIPASE, AMYLASE in the last 168 hours. No results for input(s): AMMONIA in the last 168 hours.  CBC: Recent Labs  Lab 05/04/20 0436 05/08/20 0413  WBC 5.1 4.7  HGB 10.1* 13.3  HCT 30.5* 40.8  MCV 69.0* 72.0*  PLT 177 149*    Cardiac Enzymes: No results for input(s): CKTOTAL, CKMB, CKMBINDEX, TROPONINI in the last 168 hours.  BNP (last 3 results) Recent Labs    04/03/20 1530  BNP 193.5*    ProBNP (last 3 results) No results for  input(s): PROBNP in the last 8760 hours.  Radiological Exams: DG CHEST PORT 1 VIEW  Result Date: 05/08/2020 CLINICAL DATA:  Increased oxygen demand.  COVID-19 positive. EXAM: PORTABLE CHEST 1 VIEW COMPARISON:  05/04/2020 FINDINGS: Patchy bilateral airspace disease unchanged from the prior study. No new area of consolidation or collapse. No effusion or edema. IMPRESSION: Patchy bilateral airspace disease unchanged. Electronically Signed   By: Marlan Palau M.D.   On: 05/08/2020 11:09    Assessment/Plan Active Problems:   Acute on chronic respiratory failure with hypoxia (HCC)   Severe sepsis (HCC)   COVID-19 virus infection   Emphysema with chronic bronchitis (HCC)   1. Acute on chronic respiratory failure hypoxia  patient will continue on Oxymizer at this time currently on 10 L we will continue supportive measures. 2. Severe sepsis resolved hemodynamics are stable. 3. COVID-19 virus infection in recovery phase 4. Chronic bronchitis emphysema supportive care nebulizers as ordered.   I have personally seen and evaluated the patient, evaluated laboratory and imaging results, formulated the assessment and plan and placed orders. The Patient requires high complexity decision making with multiple systems involvement.  Rounds were done with the Respiratory Therapy Director and Staff therapists and discussed with nursing staff also.  Yevonne Pax, MD [facility] Pulmonary Critical Care Medicine Sleep Medicine

## 2020-05-09 LAB — BLOOD GAS, ARTERIAL
Acid-base deficit: 4.8 mmol/L — ABNORMAL HIGH (ref 0.0–2.0)
Bicarbonate: 18.6 mmol/L — ABNORMAL LOW (ref 20.0–28.0)
FIO2: 100
O2 Saturation: 98.3 %
Patient temperature: 37
pCO2 arterial: 27.7 mmHg — ABNORMAL LOW (ref 32.0–48.0)
pH, Arterial: 7.442 (ref 7.350–7.450)
pO2, Arterial: 122 mmHg — ABNORMAL HIGH (ref 83.0–108.0)

## 2020-05-09 LAB — BASIC METABOLIC PANEL
Anion gap: 16 — ABNORMAL HIGH (ref 5–15)
BUN: 35 mg/dL — ABNORMAL HIGH (ref 8–23)
CO2: 15 mmol/L — ABNORMAL LOW (ref 22–32)
Calcium: 8.9 mg/dL (ref 8.9–10.3)
Chloride: 113 mmol/L — ABNORMAL HIGH (ref 98–111)
Creatinine, Ser: 1.33 mg/dL — ABNORMAL HIGH (ref 0.61–1.24)
GFR, Estimated: 48 mL/min — ABNORMAL LOW (ref >60)
Glucose, Bld: 91 mg/dL (ref 70–99)
Potassium: 4.1 mmol/L (ref 3.5–5.1)
Sodium: 144 mmol/L (ref 135–145)

## 2020-05-09 LAB — CORTISOL: Cortisol, Plasma: 17 ug/dL

## 2020-05-09 LAB — CBC
HCT: 41.7 % (ref 39.0–52.0)
Hemoglobin: 13.4 g/dL (ref 13.0–17.0)
MCH: 22.8 pg — ABNORMAL LOW (ref 26.0–34.0)
MCHC: 32.1 g/dL (ref 30.0–36.0)
MCV: 70.8 fL — ABNORMAL LOW (ref 80.0–100.0)
Platelets: 180 10*3/uL (ref 150–400)
RBC: 5.89 MIL/uL — ABNORMAL HIGH (ref 4.22–5.81)
RDW: 26.5 % — ABNORMAL HIGH (ref 11.5–15.5)
WBC: 7.6 10*3/uL (ref 4.0–10.5)
nRBC: 9.5 % — ABNORMAL HIGH (ref 0.0–0.2)

## 2020-05-09 LAB — TSH: TSH: 0.918 u[IU]/mL (ref 0.350–4.500)

## 2020-05-09 LAB — PATHOLOGIST SMEAR REVIEW

## 2020-05-09 NOTE — Progress Notes (Addendum)
Pulmonary Critical Care Medicine [facility] [place]   PULMONARY CRITICAL CARE SERVICE  PROGRESS NOTE  Date of Service: 05/09/2020  Stephen Paul  [id number]  DOB: Dec 12, 1931   DOA: 04/07/2020  Referring Physician: Carron Curie, MD  HPI: Stephen Paul is a 86 y.o. male seen for follow up of Acute on Chronic Respiratory Failure.  Patient is currently on heated high flow nasal cannula at 100% FiO2 also using a nonrebreather at 100% FiO2.  Maintaining saturation at this time.  Medications: Reviewed on Rounds  Physical Exam:  Vitals: Pulse 115 respirations 32 BP 121/61 O2 sat 95% temp 97.0  Ventilator Settings not currently on ventilator  . General: Comfortable at this time . Eyes: Grossly normal lids, irises & conjunctiva . ENT: grossly tongue is normal . Neck: no obvious mass . Cardiovascular: S1 S2 normal no gallop . Respiratory: No rales or rhonchi noted . Abdomen: soft . Skin: no rash seen on limited exam . Musculoskeletal: not rigid . Psychiatric:unable to assess . Neurologic: no seizure no involuntary movements         Lab Data:   Basic Metabolic Panel: Recent Labs  Lab 05/04/20 0624 05/08/20 0413 05/09/20 0414  NA 140 142 144  K 4.5 4.5 4.1  CL 112* 113* 113*  CO2 14* 16* 15*  GLUCOSE 207* 152* 91  BUN 40* 32* 35*  CREATININE 1.24 1.13 1.33*  CALCIUM 8.3* 8.8* 8.9    ABG: Recent Labs  Lab 05/03/20 1103 05/09/20 0918  PHART 7.448 7.442  PCO2ART 20.9* 27.7*  PO2ART 159* 122*  HCO3 14.2* 18.6*  O2SAT 99.1 98.3    Liver Function Tests: No results for input(s): AST, ALT, ALKPHOS, BILITOT, PROT, ALBUMIN in the last 168 hours. No results for input(s): LIPASE, AMYLASE in the last 168 hours. No results for input(s): AMMONIA in the last 168 hours.  CBC: Recent Labs  Lab 05/04/20 0436 05/08/20 0413 05/09/20 0414  WBC 5.1 4.7 7.6  HGB 10.1* 13.3 13.4  HCT 30.5* 40.8 41.7  MCV 69.0* 72.0* 70.8*  PLT 177 149* 180    Cardiac  Enzymes: No results for input(s): CKTOTAL, CKMB, CKMBINDEX, TROPONINI in the last 168 hours.  BNP (last 3 results) Recent Labs    04/03/20 1530  BNP 193.5*    ProBNP (last 3 results) No results for input(s): PROBNP in the last 8760 hours.  Radiological Exams: DG CHEST PORT 1 VIEW  Result Date: 05/08/2020 CLINICAL DATA:  Increased oxygen demand.  COVID-19 positive. EXAM: PORTABLE CHEST 1 VIEW COMPARISON:  05/04/2020 FINDINGS: Patchy bilateral airspace disease unchanged from the prior study. No new area of consolidation or collapse. No effusion or edema. IMPRESSION: Patchy bilateral airspace disease unchanged. Electronically Signed   By: Marlan Palau M.D.   On: 05/08/2020 11:09    Assessment/Plan Active Problems:   Acute on chronic respiratory failure with hypoxia (HCC)   Severe sepsis (HCC)   COVID-19 virus infection   Emphysema with chronic bronchitis (HCC)   1. Acute on chronic respiratory failure hypoxiapatient's oxygen requirements have increased currently on heated high flow nasal cannula at 40 L 100% FiO2 also using a nonrebreather at 100% FiO2.  Maintaining sats in the low 90s.  We will continue supportive measures and aggressive pulmonary toilet. 2. Severe sepsis resolved hemodynamics are stable. 3. COVID-19 virus infection in recovery phase 4. Chronic bronchitis emphysema supportive care nebulizers as ordered.   I have personally seen and evaluated the patient, evaluated laboratory and imaging results, formulated the  assessment and plan and placed orders. The Patient requires high complexity decision making with multiple systems involvement.  Rounds were done with the Respiratory Therapy Director and Staff therapists and discussed with nursing staff also.  Allyne Gee, MD [facility] Pulmonary Critical Care Medicine Sleep Medicine

## 2020-05-09 NOTE — Consult Note (Signed)
Infectious Disease Consultation   Stephen Paul  ZJQ:[id number]  DOB: 04-Aug-1931  DOA: Apr 28, 2020  Requesting physician: Dr. Manson Passey  Reason for consultation: Antibiotic recommendations   History of Present Illness: Stephen Paul is an 86 y.o. male with history of diabetes mellitus, COPD, hypertension was admitted to the acute facility on 04/03/2020 with diagnosis of COVID-19 infection.  Upon arrival in the emergency room his oxygen saturation on room air was in the 70s.  He also had symptoms of cough, subjective fever, abdominal pain.  He was placed on BiPAP.  After that he was maintained on high flow oxygen nasal cannula.  Chest x-ray showed bilateral lung infiltrates.  Patient received antibiotic treatment with azithromycin, ceftriaxone.  He was also given monoclonal antibody treatment and was treated with remdesivir and Decadron.  Due to his complex medical problems he was transferred and admitted to [facility].  Pulmonary was consulted because of increasing oxygen requirement.  He had Enterococcus in the urine therefore treated with Levaquin, also treated with nitrofurantoin.  Levaquin ended on 05/03/2020.  Patient now having worsening shortness of breath.  He is currently on nonrebreather.  Chest x-ray done yesterday per report patchy bilateral airspace disease.  Review of Systems:  Unable to obtain at this time  Past Medical History: Past Medical History:  Diagnosis Date  . Acute on chronic respiratory failure with hypoxia (HCC)   . COVID-19 virus infection   . Diabetes mellitus without complication (HCC)   . Emphysema with chronic bronchitis (HCC)   . Hypertension   . Severe sepsis (HCC)   Diabetes mellitus, GERD, hypertension, hyperlipidemia  Past Surgical History: No past surgical history on file.   Allergies:   Allergies  Allergen Reactions  . Mycophenolate Mofetil Rash  Sulfa  Social History:  reports that he has never smoked. He has never  used smokeless tobacco. He reports that he does not drink alcohol and does not use drugs.   Family History: No family history on file.   Physical Exam: Vitals: Temperature 97.5, pulse 99, respiratory 31, blood pressure 133/77, pulse oximetry 96% Constitutional: Ill-appearing male, having shortness of breath, on oxygen nonrebreather Head: Atraumatic, normocephalic Eyes: PERLA, EOMI  ENMT: external ears and nose appear normal, Lips appears normal, moist orol mucosa Neck: neck appears normal, no masses CVS: S1-S2  Respiratory: Decreased breath sound lower lobes, rhonchi, wheezing Abdomen: soft nontender, nondistended, normal bowel sounds Musculoskeletal: No edema Neuro: He is not following commands, unable to do a neurologic exam at this time Psych: Unable to assess Skin: no   Data reviewed:  I have personally reviewed following labs and imaging studies Labs:  CBC: Recent Labs  Lab 05/04/20 0436 05/08/20 0413 05/09/20 0414  WBC 5.1 4.7 7.6  HGB 10.1* 13.3 13.4  HCT 30.5* 40.8 41.7  MCV 69.0* 72.0* 70.8*  PLT 177 149* 180    Basic Metabolic Panel: Recent Labs  Lab 05/04/20 0624 05/04/20 0624 05/08/20 0413 05/09/20 0414  NA 140  --  142 144  K 4.5   < > 4.5 4.1  CL 112*  --  113* 113*  CO2 14*  --  16* 15*  GLUCOSE 207*  --  152* 91  BUN 40*  --  32* 35*  CREATININE 1.24  --  1.13 1.33*  CALCIUM 8.3*  --  8.8* 8.9   < > = values in this interval not displayed.   GFR CrCl cannot be calculated (  Unknown ideal weight.). Liver Function Tests: No results for input(s): AST, ALT, ALKPHOS, BILITOT, PROT, ALBUMIN in the last 168 hours. No results for input(s): LIPASE, AMYLASE in the last 168 hours. No results for input(s): AMMONIA in the last 168 hours. Coagulation profile No results for input(s): INR, PROTIME in the last 168 hours.  Cardiac Enzymes: No results for input(s): CKTOTAL, CKMB, CKMBINDEX, TROPONINI in the last 168 hours. BNP: Invalid input(s):  POCBNP CBG: No results for input(s): GLUCAP in the last 168 hours. D-Dimer No results for input(s): DDIMER in the last 72 hours. Hgb A1c No results for input(s): HGBA1C in the last 72 hours. Lipid Profile No results for input(s): CHOL, HDL, LDLCALC, TRIG, CHOLHDL, LDLDIRECT in the last 72 hours. Thyroid function studies Recent Labs    05/09/20 0414  TSH 0.918   Anemia work up No results for input(s): VITAMINB12, FOLATE, FERRITIN, TIBC, IRON, RETICCTPCT in the last 72 hours. Urinalysis    Component Value Date/Time   COLORURINE STRAW (A) 05/03/2020 1141   APPEARANCEUR CLEAR 05/03/2020 1141   LABSPEC 1.009 05/03/2020 1141   PHURINE 5.0 05/03/2020 1141   GLUCOSEU 50 (A) 05/03/2020 1141   HGBUR NEGATIVE 05/03/2020 1141   BILIRUBINUR NEGATIVE 05/03/2020 1141   KETONESUR NEGATIVE 05/03/2020 1141   PROTEINUR NEGATIVE 05/03/2020 1141   NITRITE NEGATIVE 05/03/2020 1141   LEUKOCYTESUR NEGATIVE 05/03/2020 1141     Microbiology No results found for this or any previous visit (from the past 240 hour(s)).   Inpatient Medications:   Please see MAR   Radiological Exams on Admission: DG CHEST PORT 1 VIEW  Result Date: 05/08/2020 CLINICAL DATA:  Increased oxygen demand.  COVID-19 positive. EXAM: PORTABLE CHEST 1 VIEW COMPARISON:  05/04/2020 FINDINGS: Patchy bilateral airspace disease unchanged from the prior study. No new area of consolidation or collapse. No effusion or edema. IMPRESSION: Patchy bilateral airspace disease unchanged. Electronically Signed   By: Marlan Palau M.D.   On: 05/08/2020 11:09    Impression/Recommendations Active Problems:   Acute on chronic respiratory failure with hypoxia (HCC)   Severe sepsis (HCC)   COVID-19 virus infection   Emphysema with acute on chronic bronchitis (HCC) UTI with Enterococcus Encephalopathy Diabetes mellitus CKD, stage III Protein calorie malnutrition  Acute on chronic respiratory failure with hypoxemia: Multifactorial  etiology.  Patient has severe COPD with emphysema and chronic bronchitis at baseline.  After that he had COVID-19 infection for which she was treated at the acute facility with Decadron, monoclonal antibodies.  Now with worsening respiratory failure likely worsening of his baseline COPD.  He is also high risk for pneumonia.  Unfortunately may not be able to give [place] a good quality sputum.  He is already received treatment with antibiotics with Levaquin.  We will start him on azithromycin.  Monitor on telemetry.  Pulmonary following.  Unfortunately given his severe COPD he is high risk for worsening and decompensation.  COVID-19 infection: Patient was already treated with Decadron, folic acid, nocturnal antibiotics at the acute facility.  Here he received treatment with hydroxyurea, folic acid.  Continue supportive management per the primary team.  Emphysema with chronic bronchitis/severe COPD: He has severe COPD. Continue medications for the COPD per the primary team.  He is on Solu-Medrol and DuoNeb.  Adding azithromycin.  UTI with Enterococcus: Patient already received treatment with antibiotics.  Encephalopathy: Patient currently altered, on brain stimulating agent per the primary team.  Encephalopathy could be multifactorial could be secondary to hypoxemia versus setting of acute illness.  Diabetes  mellitus: Continue to monitor Accu-Cheks, management of diabetes per the primary team.  CKD stage III: Continue to monitor pain/trending closely.  Avoid nephrotoxic medications.  Further management per primary team.  Chronically malnutrition: Management per the primary team.  Unfortunately due to his complex medical problems he is high risk for worsening and decompensation.  Thank you for this consultation.   Plan of care discussed with the primary team, pharmacy.  Vonzella Nipple M.D. 05/09/2020, 2:48 PM

## 2020-05-10 LAB — CBC
HCT: 39.2 % (ref 39.0–52.0)
Hemoglobin: 12.4 g/dL — ABNORMAL LOW (ref 13.0–17.0)
MCH: 22.7 pg — ABNORMAL LOW (ref 26.0–34.0)
MCHC: 31.6 g/dL (ref 30.0–36.0)
MCV: 71.7 fL — ABNORMAL LOW (ref 80.0–100.0)
Platelets: 143 10*3/uL — ABNORMAL LOW (ref 150–400)
RBC: 5.47 MIL/uL (ref 4.22–5.81)
RDW: 27 % — ABNORMAL HIGH (ref 11.5–15.5)
WBC: 9.7 10*3/uL (ref 4.0–10.5)
nRBC: 1.4 % — ABNORMAL HIGH (ref 0.0–0.2)

## 2020-05-10 LAB — MAGNESIUM: Magnesium: 2 mg/dL (ref 1.7–2.4)

## 2020-05-10 LAB — PHOSPHORUS: Phosphorus: 3.8 mg/dL (ref 2.5–4.6)

## 2020-05-10 NOTE — Progress Notes (Addendum)
Pulmonary Critical Care Medicine [facility] [place]   PULMONARY CRITICAL CARE SERVICE  PROGRESS NOTE  Date of Service: 05/10/2020  Stephen Paul  [id number]  DOB: July 26, 1932   DOA: 04/05/2020  Referring Physician: Carron Curie, MD  HPI: Stephen Paul is a 86 y.o. male seen for follow up of Acute on Chronic Respiratory Failure.  Patient remains on heated high flow nasal cannula currently on 90% FiO2.  Medications: Reviewed on Rounds  Physical Exam:  Vitals: Pulse 92 respirations 16 BP 127/76 O2 sat 94% temp 95.8  Ventilator Settings not currently on ventilator  . General: Comfortable at this time . Eyes: Grossly normal lids, irises & conjunctiva . ENT: grossly tongue is normal . Neck: no obvious mass . Cardiovascular: S1 S2 normal no gallop . Respiratory: No rales or rhonchi noted . Abdomen: soft . Skin: no rash seen on limited exam . Musculoskeletal: not rigid . Psychiatric:unable to assess . Neurologic: no seizure no involuntary movements         Lab Data:   Basic Metabolic Panel: Recent Labs  Lab 05/04/20 0624 05/08/20 0413 05/09/20 0414 05/10/20 0433  NA 140 142 144  --   K 4.5 4.5 4.1  --   CL 112* 113* 113*  --   CO2 14* 16* 15*  --   GLUCOSE 207* 152* 91  --   BUN 40* 32* 35*  --   CREATININE 1.24 1.13 1.33*  --   CALCIUM 8.3* 8.8* 8.9  --   MG  --   --   --  2.0  PHOS  --   --   --  3.8    ABG: Recent Labs  Lab 05/03/20 1103 05/09/20 0918  PHART 7.448 7.442  PCO2ART 20.9* 27.7*  PO2ART 159* 122*  HCO3 14.2* 18.6*  O2SAT 99.1 98.3    Liver Function Tests: No results for input(s): AST, ALT, ALKPHOS, BILITOT, PROT, ALBUMIN in the last 168 hours. No results for input(s): LIPASE, AMYLASE in the last 168 hours. No results for input(s): AMMONIA in the last 168 hours.  CBC: Recent Labs  Lab 05/04/20 0436 05/08/20 0413 05/09/20 0414 05/10/20 0433  WBC 5.1 4.7 7.6 9.7  HGB 10.1* 13.3 13.4 12.4*  HCT 30.5* 40.8 41.7  39.2  MCV 69.0* 72.0* 70.8* 71.7*  PLT 177 149* 180 143*    Cardiac Enzymes: No results for input(s): CKTOTAL, CKMB, CKMBINDEX, TROPONINI in the last 168 hours.  BNP (last 3 results) Recent Labs    04/03/20 1530  BNP 193.5*    ProBNP (last 3 results) No results for input(s): PROBNP in the last 8760 hours.  Radiological Exams: DG CHEST PORT 1 VIEW  Result Date: 05/08/2020 CLINICAL DATA:  Increased oxygen demand.  COVID-19 positive. EXAM: PORTABLE CHEST 1 VIEW COMPARISON:  05/04/2020 FINDINGS: Patchy bilateral airspace disease unchanged from the prior study. No new area of consolidation or collapse. No effusion or edema. IMPRESSION: Patchy bilateral airspace disease unchanged. Electronically Signed   By: Marlan Palau M.D.   On: 05/08/2020 11:09    Assessment/Plan Active Problems:   Acute on chronic respiratory failure with hypoxia (HCC)   Severe sepsis (HCC)   COVID-19 virus infection   Emphysema with chronic bronchitis (HCC)   1. Acute on chronic respiratory failure hypoxiapatient continues on heated high flow nasal cannula still requiring large amounts of FiO2 currently at 90% which is an improvement from yesterday 100%.  Remains on 35 L we will continue supportive measures pulmonary toilet patient  did have change in status yesterday and is now DNR. 2. Severe sepsis resolved hemodynamics are stable. 3. COVID-19 virus infection in recovery phase 4. Chronic bronchitis emphysema supportive care nebulizers as ordered.   I have personally seen and evaluated the patient, evaluated laboratory and imaging results, formulated the assessment and plan and placed orders. The Patient requires high complexity decision making with multiple systems involvement.  Rounds were done with the Respiratory Therapy Director and Staff therapists and discussed with nursing staff also.  Yevonne Pax, MD [facility] Pulmonary Critical Care Medicine Sleep Medicine

## 2020-05-11 LAB — CBC
HCT: 39.7 % (ref 39.0–52.0)
Hemoglobin: 12.5 g/dL — ABNORMAL LOW (ref 13.0–17.0)
MCH: 23 pg — ABNORMAL LOW (ref 26.0–34.0)
MCHC: 31.5 g/dL (ref 30.0–36.0)
MCV: 73 fL — ABNORMAL LOW (ref 80.0–100.0)
Platelets: 118 10*3/uL — ABNORMAL LOW (ref 150–400)
RBC: 5.44 MIL/uL (ref 4.22–5.81)
RDW: 27.4 % — ABNORMAL HIGH (ref 11.5–15.5)
WBC: 8 10*3/uL (ref 4.0–10.5)
nRBC: 1 % — ABNORMAL HIGH (ref 0.0–0.2)

## 2020-05-11 NOTE — Progress Notes (Signed)
Pulmonary Critical Care Medicine [facility] [place]   PULMONARY CRITICAL CARE SERVICE  PROGRESS NOTE  Date of Service: 05/11/2020  Stephen Paul  [id number]  DOB: July 23, 1932   DOA: May 16, 2020  Referring Physician: Carron Curie, MD  HPI: Stephen Paul is a 86 y.o. male seen for follow up of Acute on Chronic Respiratory Failure.  Patient continues to require high flow oxygen not doing very well.  Has been refusing BiPAP at nighttime.  Patient is DNR  Medications: Reviewed on Rounds  Physical Exam:  Vitals: Temperature is 97.7 pulse 101 respiratory 20 blood pressure is 143/82 saturations 93%  Ventilator Settings on high flow oxygen 25 L with an FiO2 of 70%  . General: Comfortable at this time . Eyes: Grossly normal lids, irises & conjunctiva . ENT: grossly tongue is normal . Neck: no obvious mass . Cardiovascular: S1 S2 normal no gallop . Respiratory: No rhonchi very coarse breath sounds . Abdomen: soft . Skin: no rash seen on limited exam . Musculoskeletal: not rigid . Psychiatric:unable to assess . Neurologic: no seizure no involuntary movements         Lab Data:   Basic Metabolic Panel: Recent Labs  Lab 05/08/20 0413 05/09/20 0414 05/10/20 0433  NA 142 144  --   K 4.5 4.1  --   CL 113* 113*  --   CO2 16* 15*  --   GLUCOSE 152* 91  --   BUN 32* 35*  --   CREATININE 1.13 1.33*  --   CALCIUM 8.8* 8.9  --   MG  --   --  2.0  PHOS  --   --  3.8    ABG: Recent Labs  Lab 05/09/20 0918  PHART 7.442  PCO2ART 27.7*  PO2ART 122*  HCO3 18.6*  O2SAT 98.3    Liver Function Tests: No results for input(s): AST, ALT, ALKPHOS, BILITOT, PROT, ALBUMIN in the last 168 hours. No results for input(s): LIPASE, AMYLASE in the last 168 hours. No results for input(s): AMMONIA in the last 168 hours.  CBC: Recent Labs  Lab 05/08/20 0413 05/09/20 0414 05/10/20 0433 05/11/20 0440  WBC 4.7 7.6 9.7 8.0  HGB 13.3 13.4 12.4* 12.5*  HCT 40.8 41.7 39.2  39.7  MCV 72.0* 70.8* 71.7* 73.0*  PLT 149* 180 143* 118*    Cardiac Enzymes: No results for input(s): CKTOTAL, CKMB, CKMBINDEX, TROPONINI in the last 168 hours.  BNP (last 3 results) Recent Labs    04/03/20 1530  BNP 193.5*    ProBNP (last 3 results) No results for input(s): PROBNP in the last 8760 hours.  Radiological Exams: No results found.  Assessment/Plan Active Problems:   Acute on chronic respiratory failure with hypoxia (HCC)   Severe sepsis (HCC)   COVID-19 virus infection   Emphysema with chronic bronchitis (HCC)   1. Acute on chronic respiratory failure hypoxia we will continue with high flow oxygen titrate as tolerated.  Patient's been refusing BiPAP at nighttime. 2. Severe sepsis resolved hemodynamics are stable. 3. COVID-19 virus infection in recovery 4. Continue supportive care medical management   I have personally seen and evaluated the patient, evaluated laboratory and imaging results, formulated the assessment and plan and placed orders. The Patient requires high complexity decision making with multiple systems involvement.  Rounds were done with the Respiratory Therapy Director and Staff therapists and discussed with nursing staff also.  Yevonne Pax, MD [facility] Pulmonary Critical Care Medicine Sleep Medicine

## 2020-05-12 LAB — CBC
HCT: 39.5 % (ref 39.0–52.0)
Hemoglobin: 12.3 g/dL — ABNORMAL LOW (ref 13.0–17.0)
MCH: 22.9 pg — ABNORMAL LOW (ref 26.0–34.0)
MCHC: 31.1 g/dL (ref 30.0–36.0)
MCV: 73.6 fL — ABNORMAL LOW (ref 80.0–100.0)
Platelets: 85 10*3/uL — ABNORMAL LOW (ref 150–400)
RBC: 5.37 MIL/uL (ref 4.22–5.81)
RDW: 28.1 % — ABNORMAL HIGH (ref 11.5–15.5)
WBC: 8.5 10*3/uL (ref 4.0–10.5)
nRBC: 1.5 % — ABNORMAL HIGH (ref 0.0–0.2)

## 2020-05-12 LAB — BASIC METABOLIC PANEL
Anion gap: 14 (ref 5–15)
BUN: 74 mg/dL — ABNORMAL HIGH (ref 8–23)
CO2: 16 mmol/L — ABNORMAL LOW (ref 22–32)
Calcium: 8.4 mg/dL — ABNORMAL LOW (ref 8.9–10.3)
Chloride: 123 mmol/L — ABNORMAL HIGH (ref 98–111)
Creatinine, Ser: 2.47 mg/dL — ABNORMAL HIGH (ref 0.61–1.24)
GFR, Estimated: 23 mL/min — ABNORMAL LOW (ref >60)
Glucose, Bld: 201 mg/dL — ABNORMAL HIGH (ref 70–99)
Potassium: 5.2 mmol/L — ABNORMAL HIGH (ref 3.5–5.1)
Sodium: 153 mmol/L — ABNORMAL HIGH (ref 135–145)

## 2020-05-12 LAB — MAGNESIUM: Magnesium: 2.2 mg/dL (ref 1.7–2.4)

## 2020-05-12 LAB — HEPARIN INDUCED PLATELET AB (HIT ANTIBODY): Heparin Induced Plt Ab: 0.134 OD (ref 0.000–0.400)

## 2020-05-12 NOTE — Progress Notes (Addendum)
Pulmonary Critical Care Medicine [facility] [place]   PULMONARY CRITICAL CARE SERVICE  PROGRESS NOTE  Date of Service: 05/12/2020  Stephen Paul  QAS:[id number]  DOB: 06/24/1932   DOA: 10-May-2020  Referring Physician: Carron Curie, MD  HPI: Stephen Paul is a 86 y.o. male seen for follow up of Acute on Chronic Respiratory Failure.  Patient remains on heated high flow nasal cannula currently requiring 90% FiO2 satting well this time.  Medications: Reviewed on Rounds  Physical Exam:  Vitals: Pulse 92 respirations 26 BP 100/60 O2 sat 100% temp 96.9  Ventilator Settings not currently on ventilator  . General: Comfortable at this time . Eyes: Grossly normal lids, irises & conjunctiva . ENT: grossly tongue is normal . Neck: no obvious mass . Cardiovascular: S1 S2 normal no gallop . Respiratory: No rales or rhonchi noted . Abdomen: soft . Skin: no rash seen on limited exam . Musculoskeletal: not rigid . Psychiatric:unable to assess . Neurologic: no seizure no involuntary movements         Lab Data:   Basic Metabolic Panel: Recent Labs  Lab 05/08/20 0413 05/09/20 0414 05/10/20 0433 05/12/20 0355  NA 142 144  --  153*  K 4.5 4.1  --  5.2*  CL 113* 113*  --  123*  CO2 16* 15*  --  16*  GLUCOSE 152* 91  --  201*  BUN 32* 35*  --  74*  CREATININE 1.13 1.33*  --  2.47*  CALCIUM 8.8* 8.9  --  8.4*  MG  --   --  2.0 2.2  PHOS  --   --  3.8  --     ABG: Recent Labs  Lab 05/09/20 0918  PHART 7.442  PCO2ART 27.7*  PO2ART 122*  HCO3 18.6*  O2SAT 98.3    Liver Function Tests: No results for input(s): AST, ALT, ALKPHOS, BILITOT, PROT, ALBUMIN in the last 168 hours. No results for input(s): LIPASE, AMYLASE in the last 168 hours. No results for input(s): AMMONIA in the last 168 hours.  CBC: Recent Labs  Lab 05/08/20 0413 05/09/20 0414 05/10/20 0433 05/11/20 0440 05/12/20 0355  WBC 4.7 7.6 9.7 8.0 8.5  HGB 13.3 13.4 12.4* 12.5* 12.3*  HCT  40.8 41.7 39.2 39.7 39.5  MCV 72.0* 70.8* 71.7* 73.0* 73.6*  PLT 149* 180 143* 118* 85*    Cardiac Enzymes: No results for input(s): CKTOTAL, CKMB, CKMBINDEX, TROPONINI in the last 168 hours.  BNP (last 3 results) Recent Labs    04/03/20 1530  BNP 193.5*    ProBNP (last 3 results) No results for input(s): PROBNP in the last 8760 hours.  Radiological Exams: No results found.  Assessment/Plan Active Problems:   Acute on chronic respiratory failure with hypoxia (HCC)   Severe sepsis (HCC)   COVID-19 virus infection   Emphysema with chronic bronchitis (HCC)   1. Acute on chronic respiratory failure hypoxia patient will continue heated high flow nasal cannula with 90% FiO2 and 25 L.  There is a family discussion planned for comfort care measures patient remains full code at this time satting well no distress continue supportive measures. 2. Severe sepsis resolved hemodynamics are stable. 3. COVID-19 virus infection in recovery 4. Continue supportive care medical management   I have personally seen and evaluated the patient, evaluated laboratory and imaging results, formulated the assessment and plan and placed orders. The Patient requires high complexity decision making with multiple systems involvement.  Rounds were done with the Respiratory Therapy  Librarian, academic therapists and discussed with nursing staff also.  Allyne Gee, MD [facility] Pulmonary Critical Care Medicine Sleep Medicine

## 2020-05-13 LAB — CBC
HCT: 33.2 % — ABNORMAL LOW (ref 39.0–52.0)
Hemoglobin: 10.9 g/dL — ABNORMAL LOW (ref 13.0–17.0)
MCH: 23.8 pg — ABNORMAL LOW (ref 26.0–34.0)
MCHC: 32.8 g/dL (ref 30.0–36.0)
MCV: 72.5 fL — ABNORMAL LOW (ref 80.0–100.0)
Platelets: 65 10*3/uL — ABNORMAL LOW (ref 150–400)
RBC: 4.58 MIL/uL (ref 4.22–5.81)
RDW: 27.2 % — ABNORMAL HIGH (ref 11.5–15.5)
WBC: 6.5 10*3/uL (ref 4.0–10.5)
nRBC: 1.1 % — ABNORMAL HIGH (ref 0.0–0.2)

## 2020-05-13 LAB — BASIC METABOLIC PANEL
Anion gap: 14 (ref 5–15)
BUN: 117 mg/dL — ABNORMAL HIGH (ref 8–23)
CO2: 13 mmol/L — ABNORMAL LOW (ref 22–32)
Calcium: 7.6 mg/dL — ABNORMAL LOW (ref 8.9–10.3)
Chloride: 114 mmol/L — ABNORMAL HIGH (ref 98–111)
Creatinine, Ser: 3.49 mg/dL — ABNORMAL HIGH (ref 0.61–1.24)
GFR, Estimated: 15 mL/min — ABNORMAL LOW (ref >60)
Glucose, Bld: 232 mg/dL — ABNORMAL HIGH (ref 70–99)
Potassium: 5.6 mmol/L — ABNORMAL HIGH (ref 3.5–5.1)
Sodium: 141 mmol/L (ref 135–145)

## 2020-05-13 LAB — PHOSPHORUS: Phosphorus: 6 mg/dL — ABNORMAL HIGH (ref 2.5–4.6)

## 2020-05-13 LAB — MAGNESIUM: Magnesium: 2 mg/dL (ref 1.7–2.4)

## 2020-05-13 NOTE — Progress Notes (Signed)
Pulmonary Critical Care Medicine [facility] [place]   PULMONARY CRITICAL CARE SERVICE  PROGRESS NOTE  Date of Service: 05/13/2020  Stephen Paul  FAO:[id number]  DOB: Nov 26, 1931   DOA: 05/20/20  Referring Physician: Carron Curie, MD  HPI: Stephen Paul is a 86 y.o. male seen for follow up of Acute on Chronic Respiratory Failure.  Patient is on BiPAP this morning 10/5.  Apparently the family was not over the weekend and had a conversation regarding the patient's overall prognosis which is quite poor.  Patient now is requiring 100% FiO2  Medications: Reviewed on Rounds  Physical Exam:  Vitals: Temperature is 97.7 pulse 91 respiratory rate 18 blood pressure is 152/59 saturations 100%  Ventilator Settings on BiPAP 10/5 with an FiO2 of 100%  . General: Comfortable at this time . Eyes: Grossly normal lids, irises & conjunctiva . ENT: grossly tongue is normal . Neck: no obvious mass . Cardiovascular: S1 S2 normal no gallop . Respiratory: No rhonchi very coarse breath sounds . Abdomen: soft . Skin: no rash seen on limited exam . Musculoskeletal: not rigid . Psychiatric:unable to assess . Neurologic: no seizure no involuntary movements         Lab Data:   Basic Metabolic Panel: Recent Labs  Lab 05/08/20 0413 05/09/20 0414 05/10/20 0433 05/12/20 0355 05/13/20 0529  NA 142 144  --  153* 141  K 4.5 4.1  --  5.2* 5.6*  CL 113* 113*  --  123* 114*  CO2 16* 15*  --  16* 13*  GLUCOSE 152* 91  --  201* 232*  BUN 32* 35*  --  74* 117*  CREATININE 1.13 1.33*  --  2.47* 3.49*  CALCIUM 8.8* 8.9  --  8.4* 7.6*  MG  --   --  2.0 2.2 2.0  PHOS  --   --  3.8  --  6.0*    ABG: Recent Labs  Lab 05/09/20 0918  PHART 7.442  PCO2ART 27.7*  PO2ART 122*  HCO3 18.6*  O2SAT 98.3    Liver Function Tests: No results for input(s): AST, ALT, ALKPHOS, BILITOT, PROT, ALBUMIN in the last 168 hours. No results for input(s): LIPASE, AMYLASE in the last 168 hours. No  results for input(s): AMMONIA in the last 168 hours.  CBC: Recent Labs  Lab 05/09/20 0414 05/10/20 0433 05/11/20 0440 05/12/20 0355 05/13/20 0529  WBC 7.6 9.7 8.0 8.5 6.5  HGB 13.4 12.4* 12.5* 12.3* 10.9*  HCT 41.7 39.2 39.7 39.5 33.2*  MCV 70.8* 71.7* 73.0* 73.6* 72.5*  PLT 180 143* 118* 85* 65*    Cardiac Enzymes: No results for input(s): CKTOTAL, CKMB, CKMBINDEX, TROPONINI in the last 168 hours.  BNP (last 3 results) Recent Labs    04/03/20 1530  BNP 193.5*    ProBNP (last 3 results) No results for input(s): PROBNP in the last 8760 hours.  Radiological Exams: No results found.  Assessment/Plan Active Problems:   Acute on chronic respiratory failure with hypoxia (HCC)   Severe sepsis (HCC)   COVID-19 virus infection   Emphysema with chronic bronchitis (HCC)   1. Acute on chronic respiratory failure hypoxia we will continue with BiPAP as ordered titrate oxygen as tolerated.  Patient needs to discuss goals of care apparently DNR. 2. Severe sepsis resolved 3. COVID-19 virus infection at baseline 4. Emphysema no change supportive care   I have personally seen and evaluated the patient, evaluated laboratory and imaging results, formulated the assessment and plan and placed orders.  The Patient requires high complexity decision making with multiple systems involvement.  Rounds were done with the Respiratory Therapy Director and Staff therapists and discussed with nursing staff also.  Allyne Gee, MD [facility] Pulmonary Critical Care Medicine Sleep Medicine

## 2020-05-27 DEATH — deceased

## 2022-03-27 IMAGING — CT CT ANGIO CHEST
2 of 6 series · 18 of 46 positions shown · IV contrast (APPLIED)
Comparison: 04/03/2020

CLINICAL DATA: 45G3V-L3 positive, shortness of breath, weakness,
hypoxia, abdominal pain

EXAM:
CT ANGIOGRAPHY CHEST
CT ABDOMEN AND PELVIS WITH CONTRAST
TECHNIQUE: Multidetector CT imaging of the chest was performed using the
standard protocol during bolus administration of intravenous
contrast. Multiplanar CT image reconstructions and MIPs were
obtained to evaluate the vascular anatomy. Multidetector CT imaging
of the abdomen and pelvis was performed using the standard protocol
during bolus administration of intravenous contrast.
CONTRAST:  75mL OMNIPAQUE IOHEXOL 350 MG/ML SOLN

[Series 5: thins · axial · 0.74mm/px · z∈[-340,-44]mm · 16 of 326 slices shown]
[im 15/326  lung]
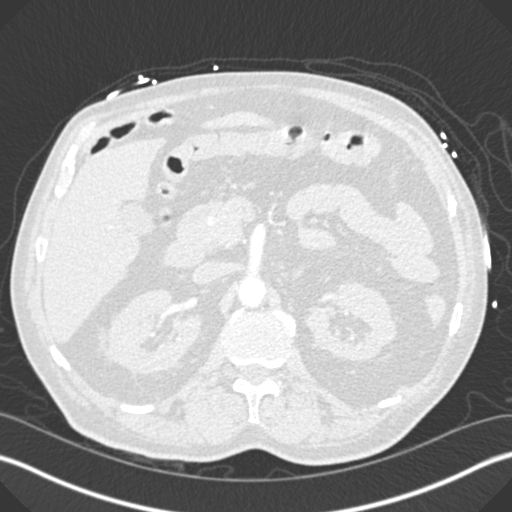
[im 43/326  soft-tissue]
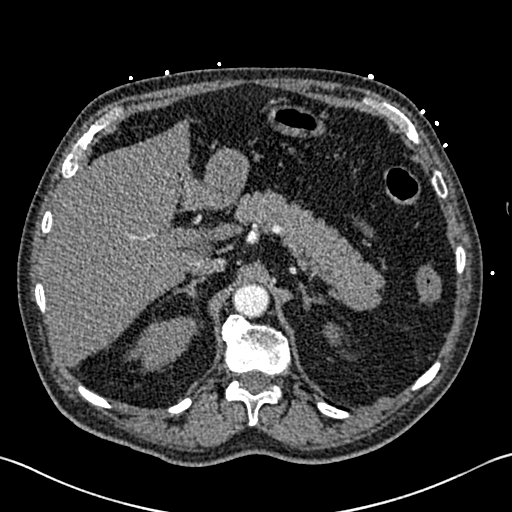
[im 57/326  lung]
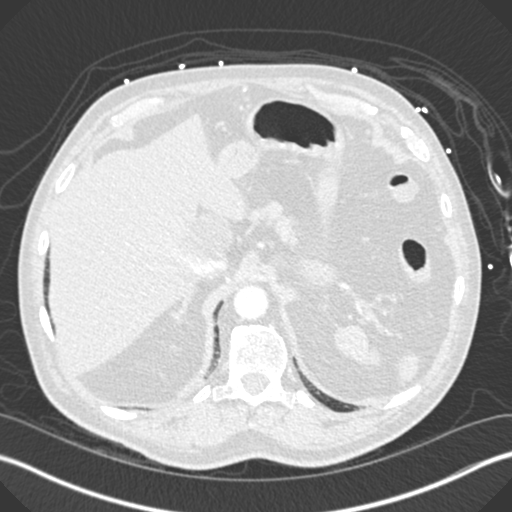
[im 71/326  soft-tissue]
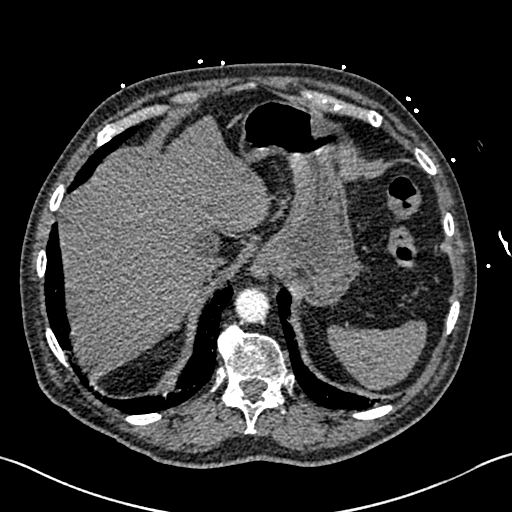
[im 99/326  lung]
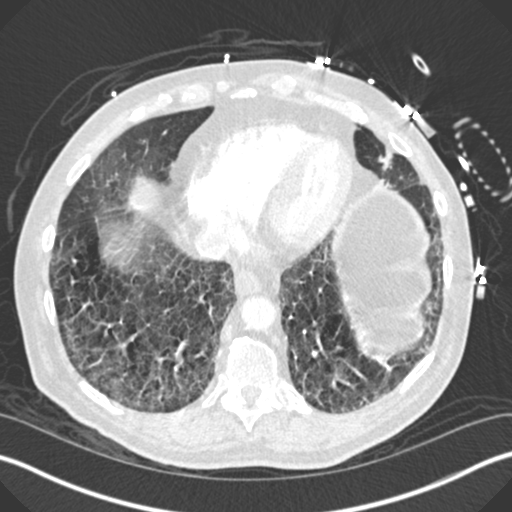
[im 114/326  soft-tissue]
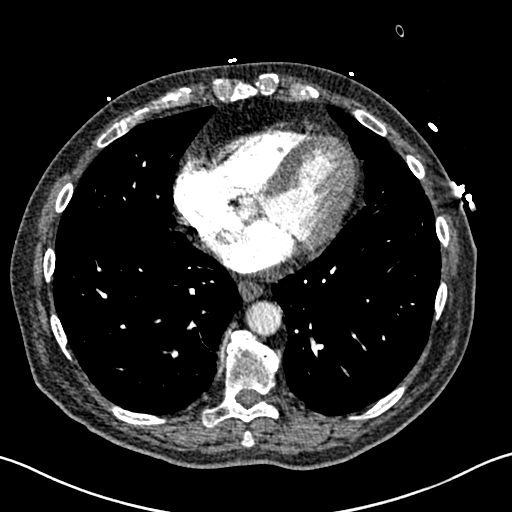
[im 128/326  lung]
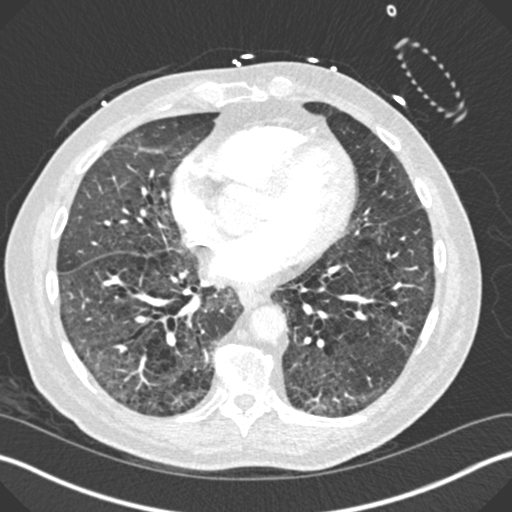
[im 156/326  soft-tissue]
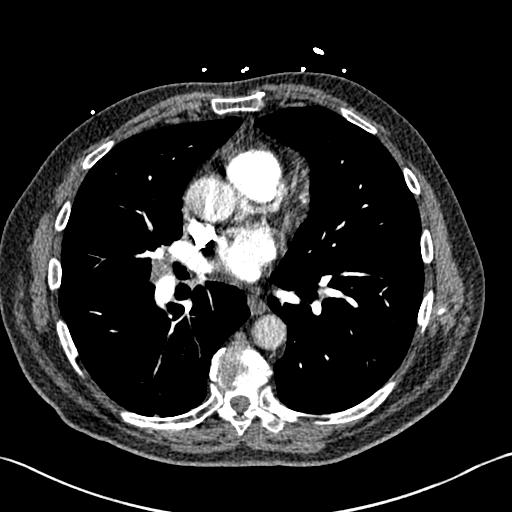
[im 170/326  lung]
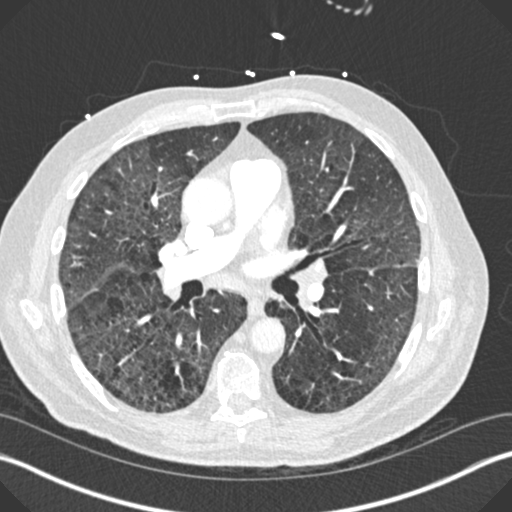
[im 198/326  soft-tissue]
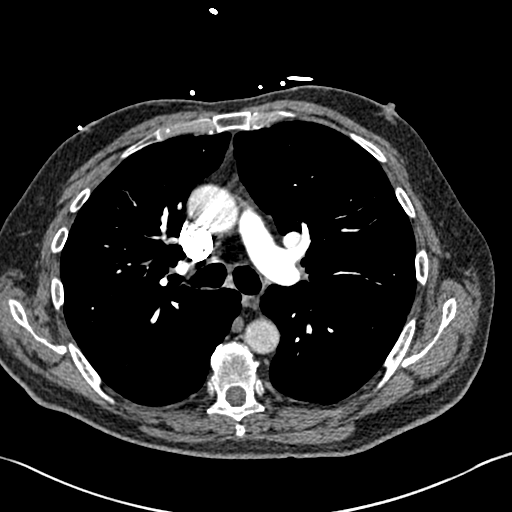
[im 212/326  lung]
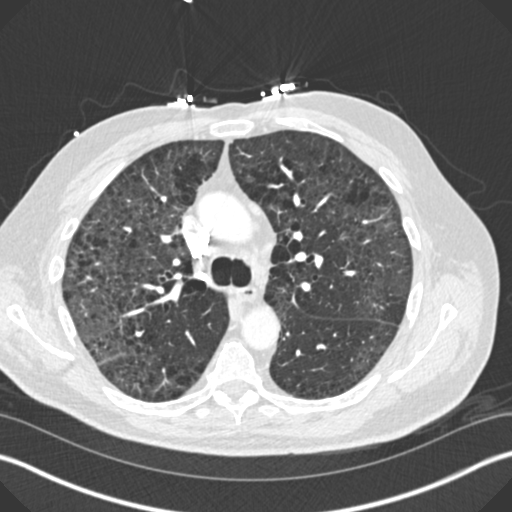
[im 227/326  soft-tissue]
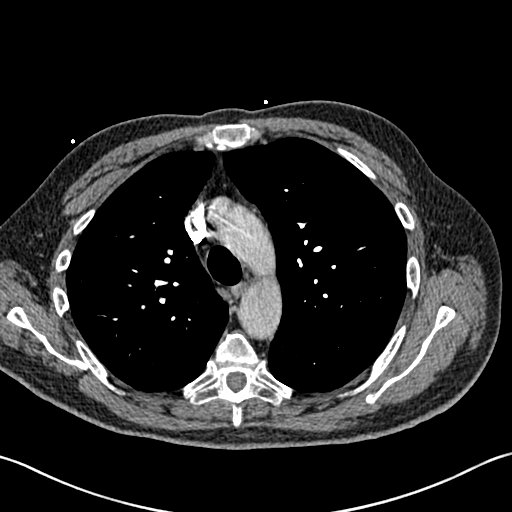
[im 255/326  lung]
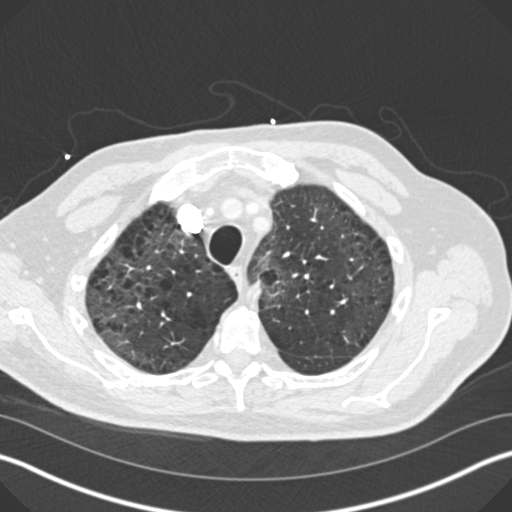
[im 269/326  soft-tissue]
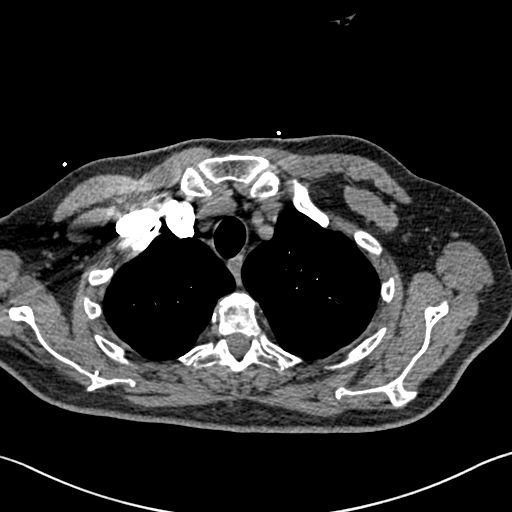
[im 283/326  lung]
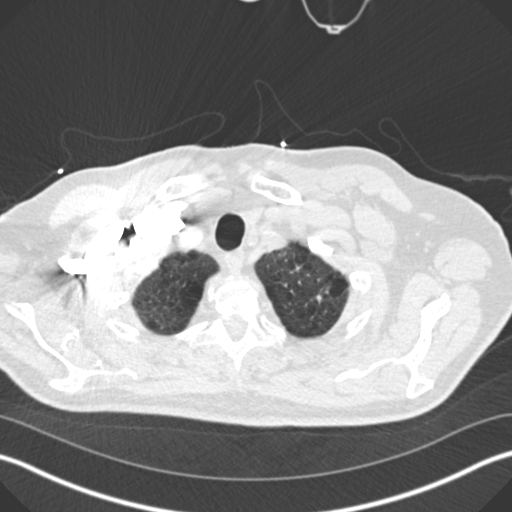
[im 311/326  soft-tissue]
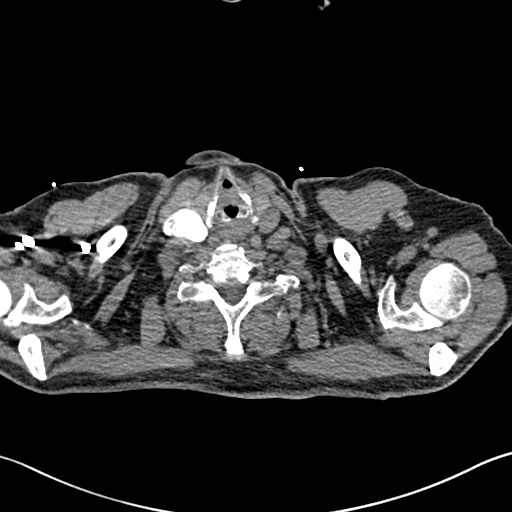

[Series 7: coronal mpr · coronal · 0.64mm/px · 2 of 99 slices shown]
[im 33/99  soft-tissue]
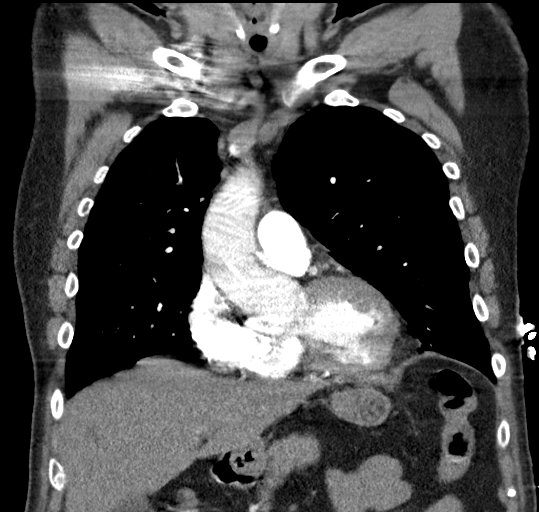
[im 66/99  soft-tissue]
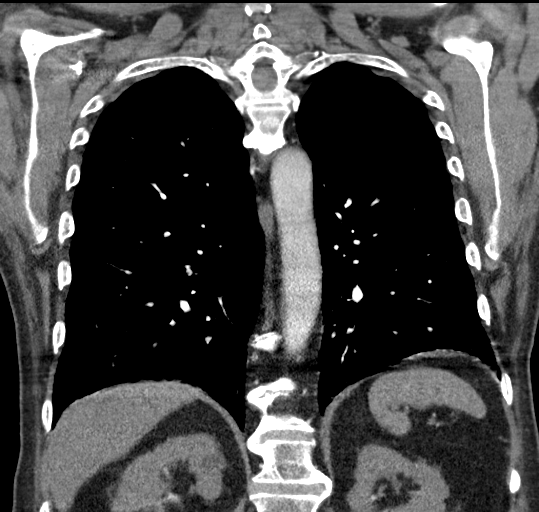

[18 of 46 positions shown; findings below may reference images not displayed]

FINDINGS: CTA CHEST FINDINGS

Cardiovascular: This is a technically adequate evaluation of the
pulmonary vasculature. No filling defects or pulmonary emboli. The
heart is unremarkable without pericardial effusion. Normal caliber
of the thoracic aorta.

Mediastinum/Nodes: Mildly enlarged subcarinal lymph node measures 13
mm in short axis. Trachea, esophagus, and thyroid are unremarkable.

Lungs/Pleura: There is severe upper lobe predominant emphysema. No
acute airspace disease, effusion, or pneumothorax. Bibasilar areas
of scarring are noted, right greater than left. The central airways
are patent.

Musculoskeletal: No acute or destructive bony lesions. Reconstructed
images demonstrate no additional findings.

Review of the MIP images confirms the above findings.

CT ABDOMEN and PELVIS FINDINGS

Hepatobiliary: 4.2 cm cyst within the caudate lobe of the liver.
Otherwise liver is unremarkable. No biliary dilation. The
gallbladder is normal.

Pancreas: Unremarkable. No pancreatic ductal dilatation or
surrounding inflammatory changes.

Spleen: Normal in size without focal abnormality.

Adrenals/Urinary Tract: Adrenal glands are unremarkable. Kidneys are
normal, without renal calculi, focal lesion, or hydronephrosis.
Bladder is unremarkable.

Stomach/Bowel: No bowel obstruction or ileus. Normal appendix right
lower quadrant. There is diffuse diverticulosis of the descending
and sigmoid colon without diverticulitis.

Vascular/Lymphatic: Aortic atherosclerosis. No enlarged abdominal or
pelvic lymph nodes.

Reproductive: Prostate is unremarkable.

Other: No free fluid or free gas.  No abdominal wall hernia.

Musculoskeletal: No acute or destructive bony lesions. Reconstructed
images demonstrate no additional findings.

Review of the MIP images confirms the above findings.
IMPRESSION: 1. No evidence of pulmonary embolus.
2. Borderline enlarged subcarinal lymph node, likely reactive.
3. Diverticulosis without diverticulitis.
4. Aortic Atherosclerosis (VON68-AGD.D) and Emphysema (VON68-ROW.2).

## 2022-03-27 IMAGING — DX DG CHEST 1V PORT
1 series · 1 of 1 positions shown · non-contrast
Comparison: None.

CLINICAL DATA: Respiratory distress, weakness, short of breath

EXAM:
PORTABLE CHEST 1 VIEW

[chest ap]
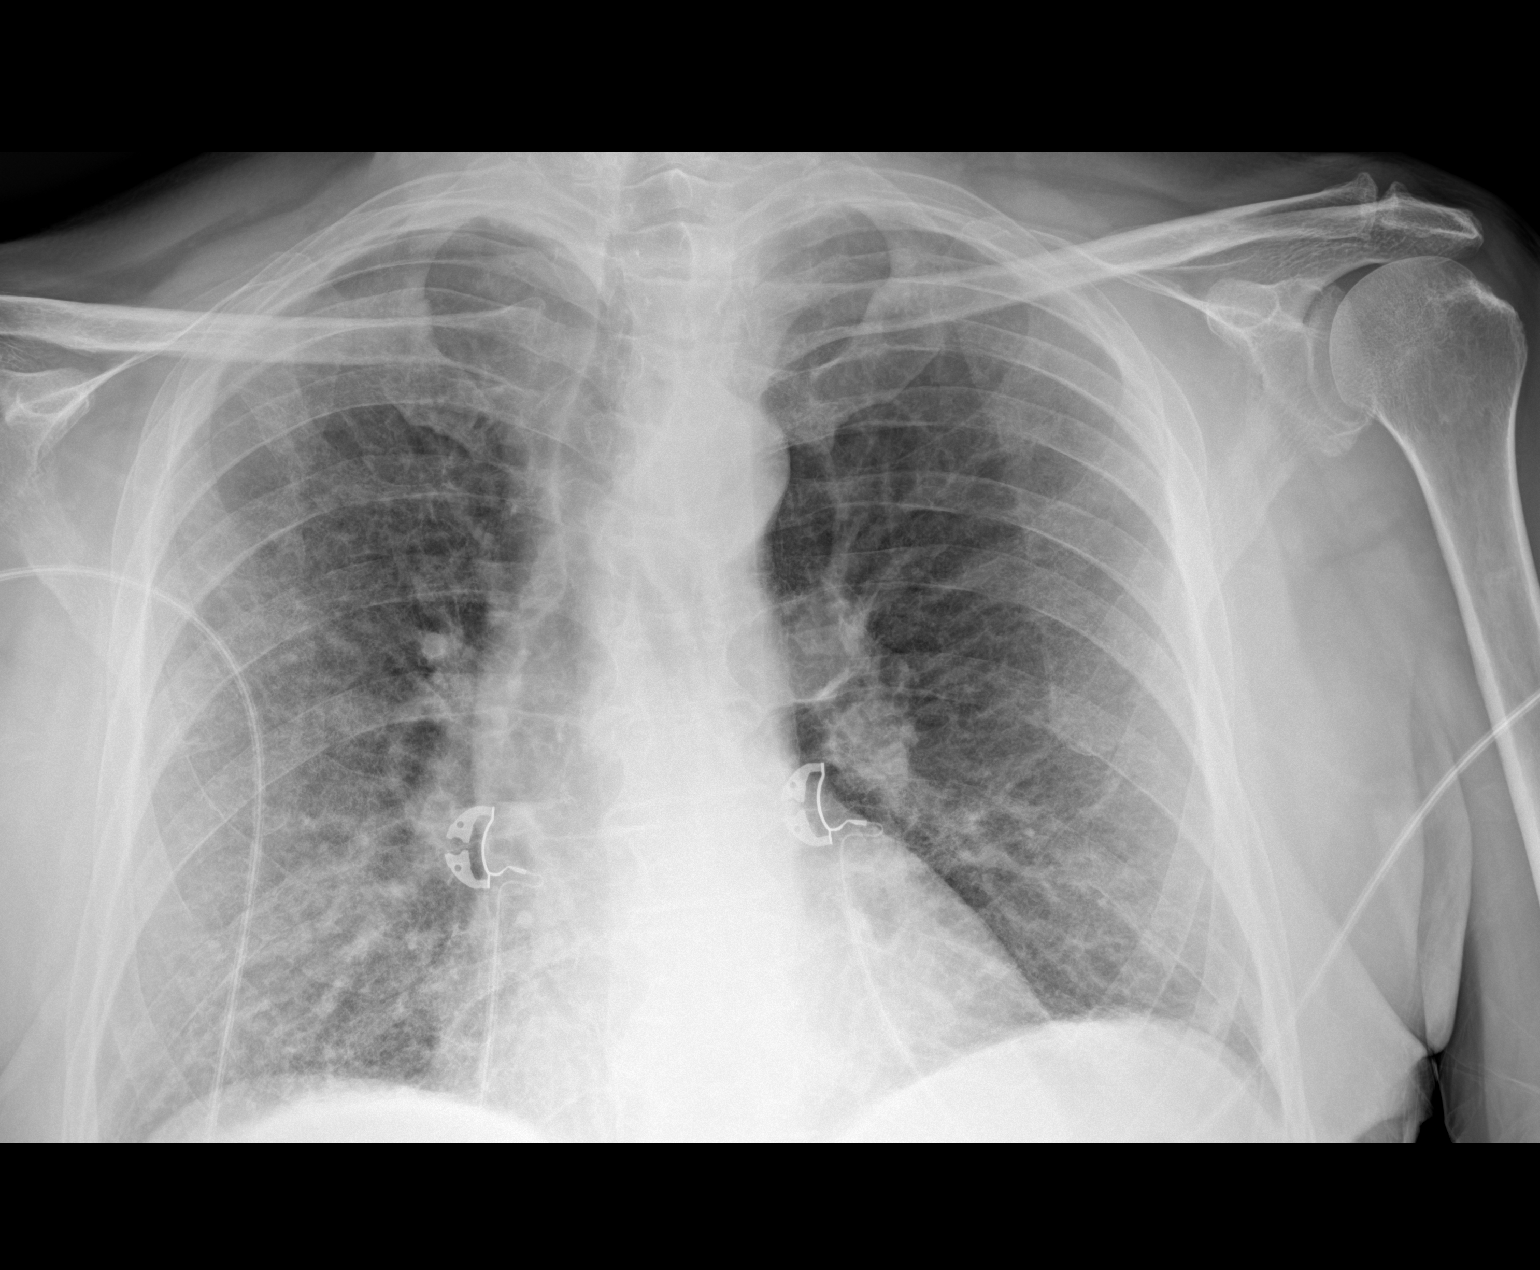

[1 of 1 positions shown; findings below may reference images not displayed]

FINDINGS: Single frontal view of the chest demonstrates an unremarkable
cardiac silhouette. There is diffuse increased interstitial
prominence throughout the lungs, without focal consolidation,
effusion, or pneumothorax. There are no acute bony abnormalities.
IMPRESSION: 1. Diffuse increased interstitial prominence, without focal lung
consolidation. Findings are of uncertain acuity, but could reflect
interstitial edema, atypical infection, or scarring.

## 2022-04-04 IMAGING — DX DG CHEST 1V PORT
1 series · 1 of 1 positions shown · non-contrast
Comparison: 04/08/2020

CLINICAL DATA: Hypoxia.  COVID positive

EXAM:
PORTABLE CHEST 1 VIEW

[chest ap]
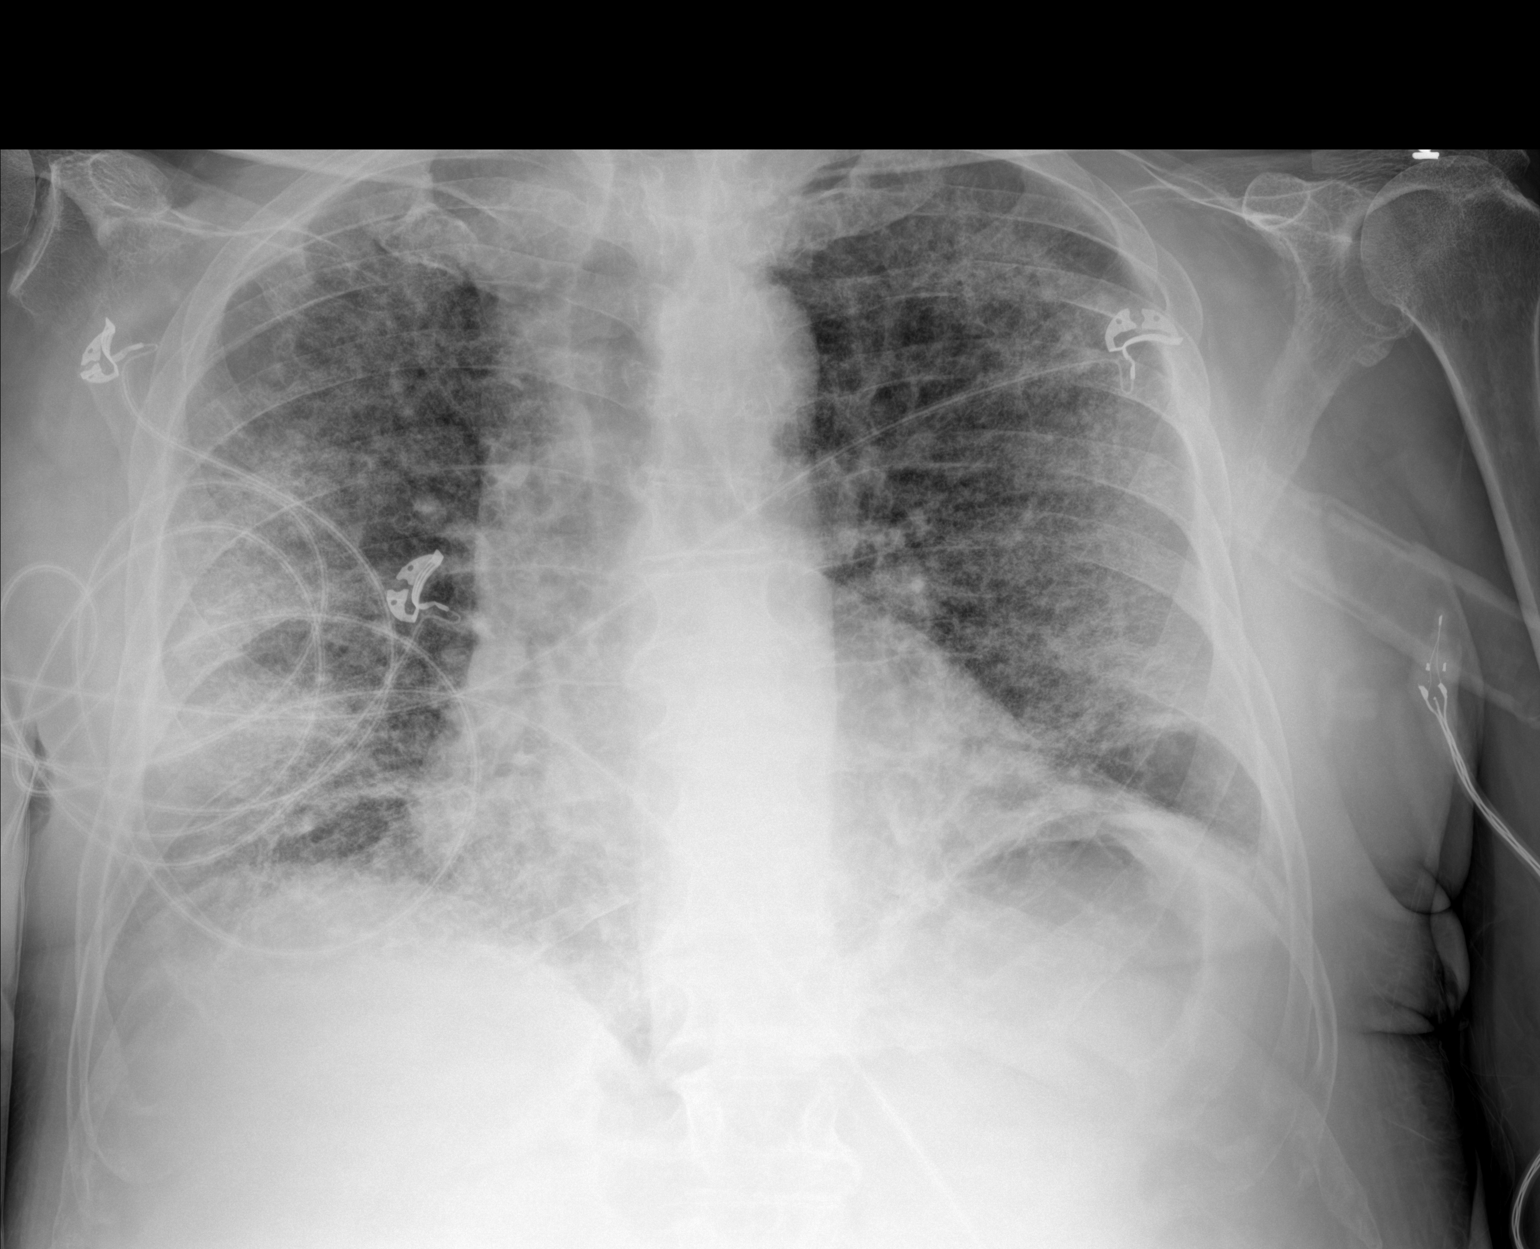

[1 of 1 positions shown; findings below may reference images not displayed]

FINDINGS: Stable airspace and interstitial opacity. Normal heart size. No
visible effusion or pneumothorax. Artifact from EKG leads
IMPRESSION: Stable infiltrates.
